# Patient Record
Sex: Female | Born: 1960 | Race: Black or African American | Hispanic: No | Smoking: Never smoker
Health system: Southern US, Community
[De-identification: ages and names within clinical notes are randomized; demographics above are authoritative.]

## PROBLEM LIST (undated history)

## (undated) DIAGNOSIS — E785 Hyperlipidemia, unspecified: Secondary | ICD-10-CM

## (undated) DIAGNOSIS — Z923 Personal history of irradiation: Secondary | ICD-10-CM

## (undated) DIAGNOSIS — K219 Gastro-esophageal reflux disease without esophagitis: Secondary | ICD-10-CM

## (undated) DIAGNOSIS — D0512 Intraductal carcinoma in situ of left breast: Secondary | ICD-10-CM

## (undated) DIAGNOSIS — D0510 Intraductal carcinoma in situ of unspecified breast: Secondary | ICD-10-CM

## (undated) DIAGNOSIS — E049 Nontoxic goiter, unspecified: Secondary | ICD-10-CM

## (undated) DIAGNOSIS — M1990 Unspecified osteoarthritis, unspecified site: Secondary | ICD-10-CM

## (undated) DIAGNOSIS — E042 Nontoxic multinodular goiter: Secondary | ICD-10-CM

## (undated) DIAGNOSIS — Z9889 Other specified postprocedural states: Secondary | ICD-10-CM

## (undated) DIAGNOSIS — R269 Unspecified abnormalities of gait and mobility: Secondary | ICD-10-CM

## (undated) DIAGNOSIS — G35 Multiple sclerosis: Secondary | ICD-10-CM

## (undated) DIAGNOSIS — E7800 Pure hypercholesterolemia, unspecified: Secondary | ICD-10-CM

## (undated) DIAGNOSIS — I10 Essential (primary) hypertension: Secondary | ICD-10-CM

## (undated) DIAGNOSIS — Z853 Personal history of malignant neoplasm of breast: Secondary | ICD-10-CM

## (undated) DIAGNOSIS — G35D Multiple sclerosis, unspecified: Secondary | ICD-10-CM

## (undated) DIAGNOSIS — D573 Sickle-cell trait: Secondary | ICD-10-CM

## (undated) DIAGNOSIS — Z86000 Personal history of in-situ neoplasm of breast: Secondary | ICD-10-CM

## (undated) DIAGNOSIS — E039 Hypothyroidism, unspecified: Secondary | ICD-10-CM

## (undated) DIAGNOSIS — C50919 Malignant neoplasm of unspecified site of unspecified female breast: Secondary | ICD-10-CM

## (undated) HISTORY — DX: Other specified postprocedural states: Z98.890

## (undated) HISTORY — DX: Multiple sclerosis, unspecified: G35.D

## (undated) HISTORY — DX: Essential (primary) hypertension: I10

## (undated) HISTORY — DX: Multiple sclerosis: G35

## (undated) HISTORY — DX: Unspecified abnormalities of gait and mobility: R26.9

## (undated) HISTORY — DX: Personal history of in-situ neoplasm of breast: Z86.000

## (undated) HISTORY — DX: Personal history of malignant neoplasm of breast: Z85.3

## (undated) HISTORY — DX: Hypothyroidism, unspecified: E03.9

## (undated) HISTORY — DX: Intraductal carcinoma in situ of unspecified breast: D05.10

## (undated) HISTORY — DX: Nontoxic multinodular goiter: E04.2

## (undated) HISTORY — DX: Hyperlipidemia, unspecified: E78.5

## (undated) HISTORY — DX: Intraductal carcinoma in situ of left breast: D05.12

## (undated) HISTORY — DX: Sickle-cell trait: D57.3

## (undated) HISTORY — DX: Pure hypercholesterolemia, unspecified: E78.00

## (undated) HISTORY — DX: Unspecified osteoarthritis, unspecified site: M19.90

## (undated) HISTORY — DX: Malignant neoplasm of unspecified site of unspecified female breast: C50.919

## (undated) HISTORY — DX: Gastro-esophageal reflux disease without esophagitis: K21.9

## (undated) HISTORY — DX: Hypercalcemia: E83.52

## (undated) HISTORY — DX: Nontoxic goiter, unspecified: E04.9

---

## 1983-12-31 HISTORY — PX: TUBAL LIGATION: SHX77

## 1998-03-21 ENCOUNTER — Ambulatory Visit (HOSPITAL_COMMUNITY): Admission: RE | Admit: 1998-03-21 | Discharge: 1998-03-21 | Payer: Self-pay | Admitting: *Deleted

## 1998-04-27 ENCOUNTER — Ambulatory Visit (HOSPITAL_COMMUNITY): Admission: RE | Admit: 1998-04-27 | Discharge: 1998-04-27 | Payer: Self-pay | Admitting: *Deleted

## 1998-05-18 ENCOUNTER — Encounter: Admission: RE | Admit: 1998-05-18 | Discharge: 1998-05-18 | Payer: Self-pay | Admitting: Obstetrics

## 1998-05-18 ENCOUNTER — Inpatient Hospital Stay (HOSPITAL_COMMUNITY): Admission: RE | Admit: 1998-05-18 | Discharge: 1998-05-18 | Payer: Self-pay | Admitting: Obstetrics

## 1998-06-08 ENCOUNTER — Encounter: Admission: RE | Admit: 1998-06-08 | Discharge: 1998-06-08 | Payer: Self-pay | Admitting: Internal Medicine

## 1998-10-26 ENCOUNTER — Encounter: Admission: RE | Admit: 1998-10-26 | Discharge: 1998-10-26 | Payer: Self-pay | Admitting: Internal Medicine

## 1999-03-01 ENCOUNTER — Encounter: Admission: RE | Admit: 1999-03-01 | Discharge: 1999-03-01 | Payer: Self-pay | Admitting: Internal Medicine

## 1999-03-23 ENCOUNTER — Encounter: Admission: RE | Admit: 1999-03-23 | Discharge: 1999-03-23 | Payer: Self-pay | Admitting: Internal Medicine

## 1999-04-13 ENCOUNTER — Encounter: Admission: RE | Admit: 1999-04-13 | Discharge: 1999-04-13 | Payer: Self-pay | Admitting: Internal Medicine

## 1999-05-01 ENCOUNTER — Encounter: Admission: RE | Admit: 1999-05-01 | Discharge: 1999-05-01 | Payer: Self-pay | Admitting: Internal Medicine

## 1999-08-04 ENCOUNTER — Ambulatory Visit (HOSPITAL_COMMUNITY): Admission: RE | Admit: 1999-08-04 | Discharge: 1999-08-04 | Payer: Self-pay | Admitting: Pulmonary Disease

## 1999-08-04 ENCOUNTER — Encounter: Payer: Self-pay | Admitting: Pulmonary Disease

## 1999-08-24 ENCOUNTER — Ambulatory Visit (HOSPITAL_COMMUNITY): Admission: RE | Admit: 1999-08-24 | Discharge: 1999-08-24 | Payer: Self-pay | Admitting: Pulmonary Disease

## 1999-08-24 ENCOUNTER — Encounter: Payer: Self-pay | Admitting: Pulmonary Disease

## 1999-10-05 ENCOUNTER — Encounter: Payer: Self-pay | Admitting: Pulmonary Disease

## 1999-10-05 ENCOUNTER — Ambulatory Visit (HOSPITAL_COMMUNITY): Admission: RE | Admit: 1999-10-05 | Discharge: 1999-10-05 | Payer: Self-pay | Admitting: Pulmonary Disease

## 1999-10-19 ENCOUNTER — Other Ambulatory Visit: Admission: RE | Admit: 1999-10-19 | Discharge: 1999-11-13 | Payer: Self-pay

## 1999-11-10 ENCOUNTER — Encounter: Payer: Self-pay | Admitting: Neurosurgery

## 1999-11-10 ENCOUNTER — Ambulatory Visit (HOSPITAL_COMMUNITY): Admission: RE | Admit: 1999-11-10 | Discharge: 1999-11-10 | Payer: Self-pay | Admitting: Neurosurgery

## 1999-11-21 ENCOUNTER — Emergency Department (HOSPITAL_COMMUNITY): Admission: EM | Admit: 1999-11-21 | Discharge: 1999-11-21 | Payer: Self-pay | Admitting: Podiatry

## 1999-11-21 ENCOUNTER — Encounter: Payer: Self-pay | Admitting: Emergency Medicine

## 1999-12-31 DIAGNOSIS — G35 Multiple sclerosis: Secondary | ICD-10-CM | POA: Insufficient documentation

## 2000-01-07 ENCOUNTER — Encounter: Payer: Self-pay | Admitting: Neurology

## 2000-01-07 ENCOUNTER — Ambulatory Visit (HOSPITAL_COMMUNITY): Admission: RE | Admit: 2000-01-07 | Discharge: 2000-01-07 | Payer: Self-pay | Admitting: Neurology

## 2000-02-12 ENCOUNTER — Encounter: Admission: RE | Admit: 2000-02-12 | Discharge: 2000-03-27 | Payer: Self-pay | Admitting: Neurology

## 2000-08-11 ENCOUNTER — Encounter: Admission: RE | Admit: 2000-08-11 | Discharge: 2000-11-09 | Payer: Self-pay | Admitting: Psychiatry

## 2001-08-03 ENCOUNTER — Encounter: Admission: RE | Admit: 2001-08-03 | Discharge: 2001-11-01 | Payer: Self-pay | Admitting: Psychiatry

## 2002-01-17 ENCOUNTER — Emergency Department (HOSPITAL_COMMUNITY): Admission: EM | Admit: 2002-01-17 | Discharge: 2002-01-17 | Payer: Self-pay

## 2002-06-01 ENCOUNTER — Encounter: Payer: Self-pay | Admitting: Pulmonary Disease

## 2002-06-01 ENCOUNTER — Ambulatory Visit (HOSPITAL_COMMUNITY): Admission: RE | Admit: 2002-06-01 | Discharge: 2002-06-01 | Payer: Self-pay | Admitting: Pulmonary Disease

## 2004-09-18 ENCOUNTER — Ambulatory Visit (HOSPITAL_COMMUNITY): Admission: RE | Admit: 2004-09-18 | Discharge: 2004-09-18 | Payer: Self-pay | Admitting: Pulmonary Disease

## 2005-12-30 DIAGNOSIS — D0510 Intraductal carcinoma in situ of unspecified breast: Secondary | ICD-10-CM

## 2005-12-30 DIAGNOSIS — Z853 Personal history of malignant neoplasm of breast: Secondary | ICD-10-CM

## 2005-12-30 DIAGNOSIS — C50919 Malignant neoplasm of unspecified site of unspecified female breast: Secondary | ICD-10-CM

## 2005-12-30 HISTORY — DX: Intraductal carcinoma in situ of unspecified breast: D05.10

## 2005-12-30 HISTORY — DX: Personal history of malignant neoplasm of breast: Z85.3

## 2005-12-30 HISTORY — DX: Malignant neoplasm of unspecified site of unspecified female breast: C50.919

## 2005-12-30 HISTORY — PX: BREAST LUMPECTOMY: SHX2

## 2006-01-23 ENCOUNTER — Ambulatory Visit (HOSPITAL_COMMUNITY): Admission: RE | Admit: 2006-01-23 | Discharge: 2006-01-23 | Payer: Self-pay | Admitting: Pulmonary Disease

## 2006-02-17 ENCOUNTER — Encounter: Admission: RE | Admit: 2006-02-17 | Discharge: 2006-02-17 | Payer: Self-pay | Admitting: Pulmonary Disease

## 2006-03-04 ENCOUNTER — Encounter: Admission: RE | Admit: 2006-03-04 | Discharge: 2006-03-04 | Payer: Self-pay | Admitting: Pulmonary Disease

## 2006-03-12 ENCOUNTER — Encounter: Admission: RE | Admit: 2006-03-12 | Discharge: 2006-03-12 | Payer: Self-pay | Admitting: Pulmonary Disease

## 2006-03-13 ENCOUNTER — Encounter: Admission: RE | Admit: 2006-03-13 | Discharge: 2006-03-13 | Payer: Self-pay | Admitting: Pulmonary Disease

## 2006-03-13 ENCOUNTER — Encounter (INDEPENDENT_AMBULATORY_CARE_PROVIDER_SITE_OTHER): Payer: Self-pay | Admitting: Diagnostic Radiology

## 2006-03-13 ENCOUNTER — Encounter (INDEPENDENT_AMBULATORY_CARE_PROVIDER_SITE_OTHER): Payer: Self-pay | Admitting: Specialist

## 2006-04-09 ENCOUNTER — Encounter: Admission: RE | Admit: 2006-04-09 | Discharge: 2006-04-09 | Payer: Self-pay | Admitting: Surgery

## 2006-04-10 ENCOUNTER — Encounter: Admission: RE | Admit: 2006-04-10 | Discharge: 2006-04-10 | Payer: Self-pay | Admitting: Surgery

## 2006-04-10 ENCOUNTER — Encounter (INDEPENDENT_AMBULATORY_CARE_PROVIDER_SITE_OTHER): Payer: Self-pay | Admitting: *Deleted

## 2006-04-10 ENCOUNTER — Ambulatory Visit (HOSPITAL_BASED_OUTPATIENT_CLINIC_OR_DEPARTMENT_OTHER): Admission: RE | Admit: 2006-04-10 | Discharge: 2006-04-10 | Payer: Self-pay | Admitting: Surgery

## 2006-04-28 ENCOUNTER — Ambulatory Visit: Payer: Self-pay | Admitting: Oncology

## 2006-05-13 ENCOUNTER — Ambulatory Visit: Admission: RE | Admit: 2006-05-13 | Discharge: 2006-08-11 | Payer: Self-pay | Admitting: Radiation Oncology

## 2006-06-09 ENCOUNTER — Encounter (INDEPENDENT_AMBULATORY_CARE_PROVIDER_SITE_OTHER): Payer: Self-pay | Admitting: Specialist

## 2006-06-09 ENCOUNTER — Ambulatory Visit (HOSPITAL_BASED_OUTPATIENT_CLINIC_OR_DEPARTMENT_OTHER): Admission: RE | Admit: 2006-06-09 | Discharge: 2006-06-09 | Payer: Self-pay | Admitting: Surgery

## 2006-08-12 ENCOUNTER — Ambulatory Visit: Admission: RE | Admit: 2006-08-12 | Discharge: 2006-09-07 | Payer: Self-pay | Admitting: Radiation Oncology

## 2006-11-06 ENCOUNTER — Ambulatory Visit: Payer: Self-pay | Admitting: Oncology

## 2007-02-10 ENCOUNTER — Encounter: Admission: RE | Admit: 2007-02-10 | Discharge: 2007-02-10 | Payer: Self-pay | Admitting: Oncology

## 2007-10-05 ENCOUNTER — Encounter: Admission: RE | Admit: 2007-10-05 | Discharge: 2007-10-05 | Payer: Self-pay | Admitting: Pulmonary Disease

## 2007-10-06 ENCOUNTER — Encounter: Admission: RE | Admit: 2007-10-06 | Discharge: 2007-10-06 | Payer: Self-pay | Admitting: Pulmonary Disease

## 2007-11-05 ENCOUNTER — Ambulatory Visit: Payer: Self-pay | Admitting: Oncology

## 2007-12-17 ENCOUNTER — Ambulatory Visit: Payer: Self-pay | Admitting: Oncology

## 2007-12-21 LAB — CBC WITH DIFFERENTIAL/PLATELET
Basophils Absolute: 0 10*3/uL (ref 0.0–0.1)
HCT: 38.3 % (ref 34.8–46.6)
HGB: 13.1 g/dL (ref 11.6–15.9)
LYMPH%: 36.1 % (ref 14.0–48.0)
MCH: 26.3 pg (ref 26.0–34.0)
MCHC: 34.3 g/dL (ref 32.0–36.0)
MCV: 76.6 fL — ABNORMAL LOW (ref 81.0–101.0)
NEUT#: 1.9 10*3/uL (ref 1.5–6.5)
Platelets: 272 10*3/uL (ref 145–400)

## 2008-02-18 ENCOUNTER — Encounter: Admission: RE | Admit: 2008-02-18 | Discharge: 2008-02-18 | Payer: Self-pay | Admitting: Pulmonary Disease

## 2008-08-31 ENCOUNTER — Ambulatory Visit: Payer: Self-pay | Admitting: Oncology

## 2009-03-07 ENCOUNTER — Encounter: Admission: RE | Admit: 2009-03-07 | Discharge: 2009-03-07 | Payer: Self-pay | Admitting: Pulmonary Disease

## 2010-03-08 ENCOUNTER — Encounter: Admission: RE | Admit: 2010-03-08 | Discharge: 2010-03-08 | Payer: Self-pay | Admitting: Pulmonary Disease

## 2011-01-20 ENCOUNTER — Encounter: Payer: Self-pay | Admitting: Pulmonary Disease

## 2011-03-15 ENCOUNTER — Other Ambulatory Visit (HOSPITAL_COMMUNITY): Payer: Self-pay | Admitting: Pulmonary Disease

## 2011-03-15 DIAGNOSIS — Z1231 Encounter for screening mammogram for malignant neoplasm of breast: Secondary | ICD-10-CM

## 2011-03-19 ENCOUNTER — Other Ambulatory Visit: Payer: Self-pay | Admitting: Pulmonary Disease

## 2011-03-19 DIAGNOSIS — Z1231 Encounter for screening mammogram for malignant neoplasm of breast: Secondary | ICD-10-CM

## 2011-03-19 DIAGNOSIS — Z9889 Other specified postprocedural states: Secondary | ICD-10-CM

## 2011-03-28 ENCOUNTER — Ambulatory Visit
Admission: RE | Admit: 2011-03-28 | Discharge: 2011-03-28 | Disposition: A | Discharge status: Home / Self Care | Payer: Managed Care, Other (non HMO) | Source: Ambulatory Visit | Attending: Pulmonary Disease | Admitting: Pulmonary Disease

## 2011-03-28 DIAGNOSIS — Z9889 Other specified postprocedural states: Secondary | ICD-10-CM

## 2011-05-17 NOTE — Op Note (Signed)
NAMESUMER, MOOREHOUSE                ACCOUNT NO.:  [id number]   MEDICAL RECORD NO.:  [id number]          PATIENT TYPE:  AMB   LOCATION:  DSC                          FACILITY:  [facility]   PHYSICIAN:  Currie Paris, M.D.DATE OF BIRTH:  December 08, 1961   DATE OF PROCEDURE:  06/09/2006  DATE OF DISCHARGE:                                 OPERATIVE REPORT   CCS [phone]   PREOPERATIVE DIAGNOSIS:  Left breast cancer, upper inner quadrant with close  margin on the lumpectomy.   POSTOPERATIVE DIAGNOSIS:  Left breast cancer, upper inner quadrant with  close margin on the lumpectomy.   OPERATION:  Re-excision left lumpectomy site.   SURGEON:  Dr. Jamey Ripa.   ANESTHESIA:  MAC.   CLINICAL HISTORY:  Ms. Georgi has undergone a left lumpectomy.  Her superior  margin was quite close and after consultation with radiation therapy, we  elected to go back for re-excision prior to instigation of radiation  therapy.   DESCRIPTION OF PROCEDURE:  The patient was seen in the holding area and had  no further questions.  We identified the left breast in the operative site  and marked it.   The patient was taken to operating room and after satisfactory IV sedation  was obtained, the left breast was prepped and draped as a sterile field.  The time-out occurred.   I infiltrated a combination of 1% Xylocaine, 0.25% plain Marcaine around the  prior scar and the skin above and below it into the breast tissue.  The  incision was made using the very superior edge of the prior scar.  I raised  a little bit of a flap going inferiorly and then down to the chest wall so  that I would get the majority of the prior inferior part of the biopsy site  and then raised skin flap superiorly and took out a good margin going well  up into what appeared to be normal breast tissue for a whole new superior  margin.  I extended laterally to subareolar area and medially to beyond the  prior excision as well.  Went down to and  including what appeared to fascia  although I think most of that had been gone before.  We did have muscle  exposed.   The specimen was taken off and oriented for pathology.   I injected additional local as we worked and at this point injected some  more into the muscle and all around so that we had good postop pain relief  as well as a good relief for the procedure.  Once I made sure everything was  dry, I put a couple of clips in to mark margins of excision.  A little bit  of the breast tissue itself could be closed laterally  under this areolar area but most of it was going to have to be left as a  seroma cavity.  I closed subcu with some 3-0 Vicryl and the skin with a 4-0  Monocryl subcuticular and some Dermabond.   The patient tolerated procedure well.  There were no operative  complications.  All  counts were correct.      Currie Paris, M.D.  Electronically Signed     CJS/MEDQ  D:  06/09/2006  T:  06/09/2006  Job:  [id number]

## 2011-05-17 NOTE — Op Note (Signed)
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   OPERATION:  Needle guided excision left breast cancer.   SURGEON:  Currie Paris, M.D.   ANESTHESIA:  General.   CLINICAL HISTORY:  This is a 50 year old lady who had an area of abnormality  in the medial left breast and a biopsy showed DCIS.  After discussion with  the patient, she elected to undergo needle guided excision.   DESCRIPTION OF PROCEDURE:  There was a guidewire placed by radiology.  I  reviewed those films.  The patient and I both identified the left breast as  the operative side and marked it.  The patient was taken to the operating  room. After satisfactory general anesthesia had been obtained, the breast  was prepped and draped.  The time-out occurred.   The guidewire entered medially just above the 9 o'clock line and traversed  horizontally towards the top edge of the nipple-areolar area.  I made an  elliptical incision starting at the guidewire and working laterally towards  the nipple.  Once the incision was made, I raised skin flaps and divided  breast tissue superiorly down to the chest wall trying to stay at least 1 cm  above the tract in the guidewire.  I did a similar elevation and division of  breast tissue inferiorly, again, staying at least 1 cm beyond the guidewire.  I then medially at the guidewire entry site, divided the breast tissue  straight down to the chest wall since the calcifications lay more laterally  than that.   Then, having freed it up on three sides, I freed it up  underneath taking the muscle and fascia and elevating the entire specimen up  into the wound.  Final lateral attachments which were really subareolar were  then divided with cautery completing the excision.  The guidewire was seen  entering what appeared to be the pectoralis major muscle about the middle  portion of the excision so I felt I was well lateral to the area in question  and at least visually I had about a 3 cm wide cavity.   I made sure everything was dry using cautery.  I infiltrated 0.25% plain  Marcaine to help with postop analgesia.  I closed the subcu leaving a cavity  to fill up with a seroma.  This was done with 3-0 Vicryl.  The skin was  closed with  4-0 Monocryl subcuticular plus Dermabond.  Radiology reported we had a good  excision with the area in question well within the specimen.  This concluded  the procedure.  The patient  tolerated procedure well.  There were no  operative complications.  All counts were correct.      Currie Paris, M.D.  Electronically Signed     CJS/MEDQ  D:  04/10/2006  T:  04/10/2006  Job:  [id number]   cc:   Mina Marble, M.D.  Fax: [phone]   Trudie Buckler, M.D.

## 2012-05-07 ENCOUNTER — Other Ambulatory Visit (HOSPITAL_COMMUNITY): Payer: Self-pay | Admitting: Pulmonary Disease

## 2012-05-07 DIAGNOSIS — Z1231 Encounter for screening mammogram for malignant neoplasm of breast: Secondary | ICD-10-CM

## 2012-05-12 ENCOUNTER — Ambulatory Visit (HOSPITAL_COMMUNITY): Payer: Self-pay

## 2012-06-12 ENCOUNTER — Ambulatory Visit (HOSPITAL_COMMUNITY)
Admission: RE | Admit: 2012-06-12 | Discharge: 2012-06-12 | Disposition: A | Discharge status: Home / Self Care | Payer: Self-pay | Source: Ambulatory Visit | Attending: Pulmonary Disease | Admitting: Pulmonary Disease

## 2012-06-12 DIAGNOSIS — Z1231 Encounter for screening mammogram for malignant neoplasm of breast: Secondary | ICD-10-CM

## 2012-12-30 DIAGNOSIS — D126 Benign neoplasm of colon, unspecified: Secondary | ICD-10-CM | POA: Insufficient documentation

## 2012-12-30 HISTORY — PX: COLONOSCOPY: SHX174

## 2013-03-11 ENCOUNTER — Encounter: Payer: Self-pay | Admitting: Gastroenterology

## 2013-03-15 ENCOUNTER — Other Ambulatory Visit: Payer: Self-pay | Admitting: Neurology

## 2013-03-15 DIAGNOSIS — G35 Multiple sclerosis: Secondary | ICD-10-CM

## 2013-03-17 ENCOUNTER — Other Ambulatory Visit: Payer: Self-pay

## 2013-03-22 ENCOUNTER — Telehealth: Payer: Self-pay

## 2013-03-22 DIAGNOSIS — G35 Multiple sclerosis: Secondary | ICD-10-CM

## 2013-03-22 MED ORDER — INTERFERON BETA-1B 0.3 MG ~~LOC~~ KIT: 0.2500 mg | PACK | SUBCUTANEOUS | Status: DC

## 2013-03-22 NOTE — Telephone Encounter (Signed)
Patient would like her rx sent to [facility].  This is not a pharmacy listed in the Epic System.  It would not allow me to add the pharmacy.  I will print the rx, and once it has been signed, will manually fax it to [facility].  JCB

## 2013-03-24 ENCOUNTER — Ambulatory Visit (INDEPENDENT_AMBULATORY_CARE_PROVIDER_SITE_OTHER): Payer: BC Managed Care – PPO

## 2013-03-24 DIAGNOSIS — G35 Multiple sclerosis: Secondary | ICD-10-CM

## 2013-03-24 DIAGNOSIS — Z0289 Encounter for other administrative examinations: Secondary | ICD-10-CM

## 2013-03-25 MED ORDER — GADOPENTETATE DIMEGLUMINE 469.01 MG/ML IV SOLN: 17.0000 mL | Freq: Once | INTRAVENOUS | Status: AC | PRN

## 2013-03-26 NOTE — Procedures (Addendum)
[facility]  NEUROIMAGING REPORT   STUDY DATE: 03/24/13 PATIENT NAME: Mariah Jackson DOB: 1961/12/05 MRN: [id number]  ORDERING CLINICIAN: Levert Feinstein, MD CLINICAL HISTORY: 52 year old female with multiple sclerosis.  EXAM: MRI brain (with and without)  TECHNIQUE: MRI of the brain with and without contrast was obtained utilizing 5 mm axial slices with T1, T2, T2 flair, T2 star gradient echo and diffusion weighted views.  T1 sagittal, T2 coronal and postcontrast views in the axial and coronal plane were obtained. CONTRAST: 17ml magnevist IMAGING SITE: [facility] [address]   FINDINGS:  No abnormal lesions are seen on diffusion-weighted views to suggest acute ischemia. The cortical sulci, fissures and cisterns are normal in size and appearance. Lateral, third and fourth ventricle are normal in size and appearance. No extra-axial fluid collections are seen. No evidence of mass effect or midline shift.  Multiple periventricular and subcortical chronic demyelinating disease.  No abnormal lesions are seen on post contrast views.  On sagittal views the posterior fossa, pituitary gland and corpus callosum are unremarkable. No evidence of intracranial hemorrhage on gradient-echo views. The orbits and their contents, paranasal sinuses and calvarium are unremarkable.  Intracranial flow voids are present.  IMPRESSION:  Abnormal MRI brain (with and without) demonstrating: 1. Multiple periventricular and subcortical chronic demyelinating disease. 2. No acute plaques are seen.   INTERPRETING PHYSICIAN:  Suanne Marker, MD Certified in Neurology, Neurophysiology and Neuroimaging  [facility] [address], [address] [place], [place] [address] [phone]

## 2013-03-26 NOTE — Procedures (Signed)
[facility]  NEUROIMAGING REPORT   STUDY DATE: 03/24/13 PATIENT NAME: Mariah Jackson DOB: 01-31-1961 MRN: [id number]  ORDERING CLINICIAN: Levert Feinstein, MD  CLINICAL HISTORY: 52 year old female with multiple sclerosis.  EXAM: MRI cervical spine (with and without)  TECHNIQUE: MRI of the cervical spine was obtained utilizing 3 mm sagittal slices from the posterior fossa down to the T3-4 level with T1, T2 and inversion recovery views. In addition 4 mm axial slices from C2-3 down to T1-2 level were included with T2 and gradient echo views. CONTRAST: 17ml magnevist IMAGING SITE: [facility] [address]   FINDINGS:  On sagittal views the vertebral bodies have normal height and alignment. Disc bulging at C5-6. Spondylosis at C5-6 and C6-7.  The spinal cord is normal in size and appearance. The posterior fossa, pituitary gland and paraspinal soft tissues are unremarkable.    On axial views there is no spinal stenosis or foraminal narrowing. At C5-6 there is focal disc protrusion slightly deforms anterior spinal cord, with spinal stenosis or foraminal stenosis. Limited views of the soft tissues of the head and neck are unremarkable. No abnormal enhancing lesions.  IMPRESSION:  Mildly abnormal MRI cervical spine (with and without) demonstrating: 1. Disc bulging at C5-6. Spondylosis at C5-6 and C6-7.  2. At C5-6 there is focal disc protrusion slightly deforms anterior spinal cord, with spinal stenosis or foraminal stenosis.  3. No intrinsic or enhancing spinal cord lesions.   INTERPRETING PHYSICIAN:  Suanne Marker, MD Certified in Neurology, Neurophysiology and Neuroimaging  [facility] [address], [address] [place], [place] [address] [phone]

## 2013-03-29 ENCOUNTER — Telehealth: Payer: Self-pay | Admitting: Neurology

## 2013-03-29 NOTE — Telephone Encounter (Signed)
I have called her about MRI brain and MRI cervical report. I left message for her to call back for discussion

## 2013-03-29 NOTE — Telephone Encounter (Signed)
Patient calling back sorry I  missed Dr.Yan phone call about MRI.

## 2013-03-29 NOTE — Telephone Encounter (Signed)
I have called her, MRI of brain showed multiple plaques, consistent with multiple sclerosis, no enhancing lesions. MRI of the cervical showed no lesions,  She is overall happy with his current Betaseron treatment, will keep current treatment, followup as scheduled

## 2013-04-23 ENCOUNTER — Ambulatory Visit (AMBULATORY_SURGERY_CENTER): Payer: BC Managed Care – PPO | Admitting: *Deleted

## 2013-04-23 ENCOUNTER — Encounter: Payer: Self-pay | Admitting: Gastroenterology

## 2013-04-23 VITALS — Ht 63.5 in | Wt 183.2 lb

## 2013-04-23 DIAGNOSIS — Z1211 Encounter for screening for malignant neoplasm of colon: Secondary | ICD-10-CM

## 2013-04-23 MED ORDER — NA SULFATE-K SULFATE-MG SULF 17.5-3.13-1.6 GM/177ML PO SOLN: ORAL | Status: DC

## 2013-05-07 ENCOUNTER — Ambulatory Visit (AMBULATORY_SURGERY_CENTER): Payer: BC Managed Care – PPO | Admitting: Gastroenterology

## 2013-05-07 ENCOUNTER — Encounter: Payer: Self-pay | Admitting: Gastroenterology

## 2013-05-07 VITALS — BP 128/82 | HR 85 | Temp 97.9°F | Resp 24 | Ht 63.0 in | Wt 183.0 lb

## 2013-05-07 DIAGNOSIS — D126 Benign neoplasm of colon, unspecified: Secondary | ICD-10-CM

## 2013-05-07 DIAGNOSIS — Z1211 Encounter for screening for malignant neoplasm of colon: Secondary | ICD-10-CM

## 2013-05-07 MED ORDER — SODIUM CHLORIDE 0.9 % IV SOLN: 500.0000 mL | INTRAVENOUS | Status: DC

## 2013-05-07 NOTE — Progress Notes (Signed)
Charting documented by Wendy Myers  Patient did not experience any of the following events: a burn prior to discharge; a fall within the facility; wrong site/side/patient/procedure/implant event; or a hospital transfer or hospital admission upon discharge from the facility. (G8907)  Patient did not have preoperative order for IV antibiotic SSI prophylaxis. (G8918) 

## 2013-05-07 NOTE — Op Note (Signed)
[facility] [address] [place], [address]   COLONOSCOPY PROCEDURE REPORT  PATIENT: Mariah Jackson, Mariah Jackson  MR#: [id number] BIRTHDATE: 05-31-1961 , 52  yrs. old GENDER: Female ENDOSCOPIST: Louis Meckel, MD REFERRED XB:JYNWG Blount, M.D. PROCEDURE DATE:  05/07/2013 PROCEDURE:   Colonoscopy with snare polypectomy ASA CLASS:   Class II INDICATIONS:average risk screening. MEDICATIONS: MAC sedation, administered by CRNA and Propofol (Diprivan) 270 mg IV  DESCRIPTION OF PROCEDURE:   After the risks benefits and alternatives of the procedure were thoroughly explained, informed consent was obtained.  A digital rectal exam revealed no abnormalities of the rectum.   The LB [id number]  endoscope was introduced through the anus and advanced to the cecum, which was identified by the ileocecal valve. No adverse events experienced.   The quality of the prep was excellent using Suprep The instrument was then slowly withdrawn as the colon was fully examined.      COLON FINDINGS: A sessile polyp measuring 3 mm in size was found in the descending colon.  A polypectomy was performed with a cold snare.  The resection was complete and the polyp tissue was completely retrieved.   The colon mucosa was otherwise normal. Retroflexed views revealed no abnormalities. The time to cecum=6 minutes 04 seconds.  Withdrawal time=8 minutes 39 seconds.  The scope was withdrawn and the procedure completed. COMPLICATIONS: There were no complications.  ENDOSCOPIC IMPRESSION: 1.   Sessile polyp measuring 3 mm in size was found in the descending colon; polypectomy was performed with a cold snare 2.   The colon mucosa was otherwise normal  RECOMMENDATIONS: If the polyp(s) removed today are proven to be adenomatous (pre-cancerous) polyps, you will need a repeat colonoscopy in 5 years.  Otherwise you should continue to follow colorectal cancer screening guidelines for "routine risk"  patients with colonoscopy in 10 years.  You will receive a letter within 1-2 weeks with the results of your biopsy as well as final recommendations.  Please call my office if you have not received a letter after 3 weeks.   eSigned:  Louis Meckel, MD 05/07/2013 10:14 AM   cc:

## 2013-05-07 NOTE — Progress Notes (Signed)
Report to pacu rn, vss, bbs=clear 

## 2013-05-07 NOTE — Patient Instructions (Addendum)

## 2013-05-07 NOTE — Progress Notes (Signed)
Called to room to assist during endoscopic procedure.  Patient ID and intended procedure confirmed with present staff. Received instructions for my participation in the procedure from the performing physician. ewm 

## 2013-05-10 ENCOUNTER — Telehealth: Payer: Self-pay

## 2013-05-10 NOTE — Telephone Encounter (Signed)
  Follow up Call-  Call back number 05/07/2013  Post procedure Call Back phone  # [phone] or [phone]  Permission to leave phone message Yes     Patient questions:  Do you have a fever, pain , or abdominal swelling? no Pain Score  0 *  Have you tolerated food without any problems? yes  Have you been able to return to your normal activities? yes  Do you have any questions about your discharge instructions: Diet   no Medications  no Follow up visit  no  Do you have questions or concerns about your Care? no  Actions: * If pain score is 4 or above: No action needed, pain <4.   No problems per the pt. Maw

## 2013-05-12 ENCOUNTER — Telehealth: Payer: Self-pay

## 2013-05-12 DIAGNOSIS — G35 Multiple sclerosis: Secondary | ICD-10-CM

## 2013-05-12 MED ORDER — INTERFERON BETA-1B 0.3 MG ~~LOC~~ KIT: 0.2500 mg | PACK | SUBCUTANEOUS | Status: DC

## 2013-05-12 NOTE — Telephone Encounter (Signed)
Patient called to get refill on Betaseron sent to [facility].  This was already sent on 03/22/2013, however I have resent it.

## 2013-05-13 ENCOUNTER — Encounter: Payer: Self-pay | Admitting: Gastroenterology

## 2013-06-25 ENCOUNTER — Other Ambulatory Visit (HOSPITAL_COMMUNITY): Payer: Self-pay | Admitting: Family Medicine

## 2013-06-25 DIAGNOSIS — Z1231 Encounter for screening mammogram for malignant neoplasm of breast: Secondary | ICD-10-CM

## 2013-07-07 ENCOUNTER — Ambulatory Visit (HOSPITAL_COMMUNITY)
Admission: RE | Admit: 2013-07-07 | Discharge: 2013-07-07 | Disposition: A | Discharge status: Home / Self Care | Payer: BC Managed Care – PPO | Source: Ambulatory Visit | Attending: Family Medicine | Admitting: Family Medicine

## 2013-07-07 DIAGNOSIS — Z1231 Encounter for screening mammogram for malignant neoplasm of breast: Secondary | ICD-10-CM

## 2013-09-09 ENCOUNTER — Telehealth: Payer: Self-pay | Admitting: *Deleted

## 2013-09-09 NOTE — Telephone Encounter (Signed)
Left patient a reminder message about appointment on 09-10-13 

## 2013-09-10 ENCOUNTER — Encounter: Payer: Self-pay | Admitting: Nurse Practitioner

## 2013-09-10 ENCOUNTER — Ambulatory Visit (INDEPENDENT_AMBULATORY_CARE_PROVIDER_SITE_OTHER): Payer: BC Managed Care – PPO | Admitting: Nurse Practitioner

## 2013-09-10 VITALS — BP 146/90 | HR 95 | Ht 64.0 in | Wt 184.0 lb

## 2013-09-10 DIAGNOSIS — G35 Multiple sclerosis: Secondary | ICD-10-CM

## 2013-09-10 DIAGNOSIS — R269 Unspecified abnormalities of gait and mobility: Secondary | ICD-10-CM

## 2013-09-10 DIAGNOSIS — Z79899 Other long term (current) drug therapy: Secondary | ICD-10-CM

## 2013-09-10 HISTORY — DX: Multiple sclerosis: G35

## 2013-09-10 HISTORY — DX: Unspecified abnormalities of gait and mobility: R26.9

## 2013-09-10 LAB — CBC WITH DIFFERENTIAL/PLATELET
Basos: 0 % (ref 0–3)
Eos: 3 % (ref 0–5)
HCT: 41.3 % (ref 34.0–46.6)
Hemoglobin: 14.3 g/dL (ref 11.1–15.9)
Lymphocytes Absolute: 1.4 10*3/uL (ref 0.7–3.1)
Lymphs: 33 % (ref 14–46)
Monocytes: 8 % (ref 4–12)
Neutrophils Absolute: 2.4 10*3/uL (ref 1.4–7.0)
RBC: 5.43 x10E6/uL — ABNORMAL HIGH (ref 3.77–5.28)

## 2013-09-10 LAB — COMPREHENSIVE METABOLIC PANEL
AST: 21 IU/L (ref 0–40)
Albumin/Globulin Ratio: 1.7 (ref 1.1–2.5)
Alkaline Phosphatase: 158 IU/L — ABNORMAL HIGH (ref 39–117)
BUN/Creatinine Ratio: 16 (ref 9–23)
Calcium: 11.1 mg/dL — ABNORMAL HIGH (ref 8.7–10.2)
Creatinine, Ser: 0.81 mg/dL (ref 0.57–1.00)
GFR calc Af Amer: 95 mL/min/{1.73_m2} (ref >59)
GFR calc non Af Amer: 83 mL/min/{1.73_m2} (ref >59)
Globulin, Total: 2.7 g/dL (ref 1.5–4.5)
Potassium: 3.6 mmol/L (ref 3.5–5.2)
Sodium: 142 mmol/L (ref 134–144)
Total Bilirubin: 0.6 mg/dL (ref 0.0–1.2)

## 2013-09-10 NOTE — Patient Instructions (Addendum)
Multiple injection site problems Will switch to tecfidera  Will check labs today Followup in 6 months

## 2013-09-10 NOTE — Progress Notes (Signed)
[facility]  PATIENT: Mariah Jackson DOB: [birth year]   REASON FOR VISIT: Follow up for MS   HISTORY OF PRESENT ILLNESS: Mariah Jackson, 52 year-old white female returns for followup. She  was diagnosed with multiple sclerosis in 2001, by Dr. Tinnie Gens at [facility]. The disease has primarily affected her balance and gait. She could only clarify one exacerbations, presenting as worsening balance difficulty, and says she has never been treated with intravenous steroids.  She was placed on Betaseron shortly after diagnosis, and has been on that drug ever since. She has not been on any other immunotherapy.  Her gait difficulty had little changes over past 11 years. She ambulates with a cane, but is able to get around the house pretty well without it.  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 Most recent MRI was in January 2011,shows multiple periventricular, periatrial and subcortical white matter hyperintensities suggestive of demyelinating disease.No enhancing lesions or significant atrophy is seen. compared to MRI films from Aug 2000 there appear to be more T2 lesions in right frontal subcortical region. She is self pay now, does not want extensive study, but she will benefit repeat MRI brain and cervical cord, if her application goes through  09/10/13: Patient returns for followup after last visit 03/11/13. She is currently on Betaseron, denies any exacerbations of symptoms since last seen. She claims she is tired of taking the shots and does have multiple areas of lipoatrophy, especially on the abdominal area.  She is ambulating with a cane. She is wishing to switch to an oral  preparation   REVIEW OF SYSTEMS: Full 14 system review of systems performed and notable only for:  Constitutional: Fatigue  Cardiovascular: N/A  Ear/Nose/Throat: N/A  Skin: N/A  Eyes: N/A  Respiratory: N/A  Gastroitestinal: Urinary frequency Hematology/Lymphatic: N/A  Endocrine:  N/A Musculoskeletal: Achy muscles  Allergy/Immunology: N/A  Neurological: N/A Psychiatric: Not enough sleep, decreased energy  ALLERGIES: Allergies  Allergen Reactions  . Vicodin [Hydrocodone-Acetaminophen] Nausea Only    HOME MEDICATIONS: Outpatient Prescriptions Prior to Visit  Medication Sig Dispense Refill  . amLODipine (NORVASC) 5 MG tablet Take 5 mg by mouth daily.      Marland Kitchen dalfampridine (AMPYRA) 10 MG TB12 Take 10 mg by mouth every 12 (twelve) hours.      . Interferon Beta-1b (BETASERON) 0.3 MG KIT injection Inject 0.25 mg into the skin every other day.  3 kit  4  . levothyroxine (SYNTHROID, LEVOTHROID) 100 MCG tablet Take 100 mcg by mouth daily before breakfast.      . Multiple Vitamin (MULTIVITAMIN) tablet Take 1 tablet by mouth daily.      . rosuvastatin (CRESTOR) 10 MG tablet Take 10 mg by mouth daily.      . quinapril-hydrochlorothiazide (ACCURETIC) 10-12.5 MG per tablet Take 1 tablet by mouth daily.       No facility-administered medications prior to visit.    PAST MEDICAL HISTORY: Past Medical History  Diagnosis Date  . Multiple sclerosis   . Hypothyroidism   . Hypertension   . Hyperlipidemia   . Breast cancer 2007    left    PAST SURGICAL HISTORY: Past Surgical History  Procedure Laterality Date  . Tubal ligation  1985  . Breast lumpectomy  2007    left    FAMILY HISTORY: Family History  Problem Relation Age of Onset  . Colon cancer Neg Hx     SOCIAL HISTORY: History   Social History  .  Marital Status: Single    Spouse Name: N/A    Number of Children: N/A  . Years of Education: N/A   Occupational History  . disability   Social History Main Topics  . Smoking status: Never Smoker   . Smokeless tobacco: Never Used  . Alcohol Use: No  . Drug Use: No  . Sexual Activity: Not on file   Other Topics Concern  . Not on file   Social History Narrative  . No narrative on file     PHYSICAL EXAM  Filed Vitals:   09/10/13 0908  BP:  146/90  Pulse: 95  Height: 5\' 4"  (1.626 m)  Weight: 184 lb (83.462 kg)   Body mass index is 31.57 kg/(m^2).  Generalized: Well developed, in no acute distress  Head: normocephalic and atraumatic,. Oropharynx benign . Poor dentation with many missing teeth Neck: Supple, no carotid bruits  Cardiac: Regular rate rhythm, no murmur  Musculoskeletal: No deformity   Neurological examination   Mentation: Alert oriented to time, place, history taking. Follows all commands speech and language fluent  Cranial nerve II-XII: Fundoscopic exam reveals sharp disc margins.visual acuity 20/30 bilaterally .Pupils were equal round reactive to light extraocular movements were full, visual field were full on confrontational test. Facial sensation and strength were normal. hearing was intact to finger rubbing bilaterally. Uvula tongue midline. head turning and shoulder shrug and were normal and symmetric.Tongue protrusion into cheek strength was normal. Motor: Moderate bilateral lower extremity spasticity, no significant weakness  Sensory: Dependence decreased light touch, pinprick, vibratory at toes and absent proprioception   Coordination: finger-nose-finger, heel-to-shin bilaterally, no dysmetria Reflexes: Brachioradialis 2/2, biceps 2/2, triceps 2/2, patellar 3/3, Achilles 2/2, plantar responses were extensor  bilaterally. Gait and Station: Rising up from seated position without assistance, wide base cautious gait relies on her cane , unable to tandem gait   DIAGNOSTIC DATA (LABS, IMAGING, TESTING) - None to review  ASSESSMENT AND PLAN  52 y.o. year old female  has a past medical history of Multiple sclerosis; Hypothyroidism; Hypertension; Hyperlipidemia; and Breast cancer (2007). Here for followup. She is currently on Betaseron with complaints of injection sites issues particularly on the abdomen and thighs Discussed gilenya and tecfidera with patient, testing, and current efficacy data  Will switch to  tecfidera per patient choice  Will check labs today Followup in 6 months Research and to discuss research study for side effects of Tecfidera Nilda Riggs, [facility], [facility], APRN  [facility] [address] [place], [place] [address] [phone]

## 2013-09-13 NOTE — Progress Notes (Signed)
Quick Note:  I called pt and relayed the results of her labs (stable). She stated understanding and was waiting for them to send her the tecfidera. She was still taking her betaseron. ______

## 2013-09-27 ENCOUNTER — Ambulatory Visit (INDEPENDENT_AMBULATORY_CARE_PROVIDER_SITE_OTHER): Payer: BC Managed Care – PPO

## 2013-09-27 ENCOUNTER — Telehealth: Payer: Self-pay

## 2013-09-27 DIAGNOSIS — G35 Multiple sclerosis: Secondary | ICD-10-CM

## 2013-09-27 NOTE — Telephone Encounter (Signed)
All requested info has been submitted to insurance company to try and obtain prior [place] approval for Tecfidera.  Pending ins response.

## 2013-09-27 NOTE — Progress Notes (Signed)
Patient was in today for enrollment into the ESTEEM study. Patient was given ample time to read and review the ICF. After all questions and concern was addressed consent was signed. No related study procedures was done prior to consent being signed. All required questionnaires was collected. PRO's was given to subject to take home and to answers after her first dose of Tecfidera and return the PRO's back to our office when finish. Patient was instructed to call our office if she has any questions or concerns. Patient verbalized that she understood.

## 2013-10-22 ENCOUNTER — Telehealth: Payer: Self-pay

## 2013-10-22 MED ORDER — RANITIDINE HCL 75 MG PO TABS: 75.0000 mg | ORAL_TABLET | Freq: Two times a day (BID) | ORAL | Status: DC

## 2013-10-22 NOTE — Telephone Encounter (Signed)
Patient call stating that she is having nausea, vomiting, diarrhea and pain since Tuesday 10/19/13. Wanting you to call her to advice her what to do. She started taking Tecfidera at 120 mg for 7 days on 10/04/13 then went to 240 on 10/14/13.

## 2013-10-22 NOTE — Telephone Encounter (Signed)
I have called Mariah Jackson, she had a lot of GI side effect from Tecfidera, lasting for 3-4 days, she missed the morning Tecfidera, she feels better.   Her GI reaction is most likely from Tecfidera side effect, I have advised her to take medication after meal, also take Zantac before the Tecfidera. Rx of Zantac was called in.

## 2013-12-06 ENCOUNTER — Telehealth: Payer: Self-pay | Admitting: Nurse Practitioner

## 2013-12-06 MED ORDER — DIMETHYL FUMARATE 240 MG PO CPDR: 240.0000 mg | DELAYED_RELEASE_CAPSULE | Freq: Two times a day (BID) | ORAL | Status: DC

## 2013-12-06 NOTE — Telephone Encounter (Signed)
Patient calling again to follow up on status. Patient states that insurance is not going to cover Tecfidera and patient is completely out of the medication. Please call the patient.

## 2013-12-06 NOTE — Telephone Encounter (Signed)
I have not gotten any previous messages regarding this.  I called the ins, and they said prior auth was already approved effective from 09/27/2013 through 12/29/2038 (or until policy is terminated).  I see a call from today asking that a refill be sent to [facility], which I have sent.  I called the patient.  She is aware.

## 2013-12-06 NOTE — Telephone Encounter (Signed)
Shanda Bumps do you know anything about this. I do not see where she has called in with issues with insurance

## 2013-12-06 NOTE — Telephone Encounter (Signed)
NEEDS RX CALLED IN FOR TECFIDERA--[facility]([phone])

## 2014-01-07 ENCOUNTER — Telehealth: Payer: Self-pay | Admitting: Nurse Practitioner

## 2014-01-07 NOTE — Telephone Encounter (Signed)
Pt calling because she needs an RX for Tecfidera sent to [facility]

## 2014-01-07 NOTE — Telephone Encounter (Signed)
We sent them 7 refills last month.  I called the patient.  Advised her we already sent the Rx last month.  She said she called the pharmacy and they did not have the Rx.  I called the pharmacy.  Spoke with [facility].  He said they did get the Rx in dec.  He transferred me to [place].  She said they will process Rx today and contact patient for delivery.  I called patient back.  She is aware.

## 2014-01-07 NOTE — Telephone Encounter (Signed)
Patient calling to follow up on getting Tecfidera refill, says she only has 2 pills left for tomorrow.

## 2014-02-07 ENCOUNTER — Other Ambulatory Visit: Payer: Self-pay | Admitting: Neurology

## 2014-02-07 MED ORDER — DALFAMPRIDINE ER 10 MG PO TB12: 10.0000 mg | ORAL_TABLET | Freq: Two times a day (BID) | ORAL | Status: DC

## 2014-02-07 NOTE — Telephone Encounter (Signed)
Pt called in stating she was on a support program for Ampyra and when she called for a refill they stated they would need a new prescription.  She couldn't remember the name of the company but had a fax number that the prescription should go to - [phone].  She also stated that she has new insurance - [employer].  Please call as necessary.  She has an appointment scheduled in March.

## 2014-02-07 NOTE — Telephone Encounter (Signed)
This fax number is not in our database.  I will manually fax the written Rx once it has been signed.

## 2014-02-08 NOTE — Telephone Encounter (Signed)
Rx signed and faxed.

## 2014-02-18 ENCOUNTER — Telehealth: Payer: Self-pay | Admitting: Neurology

## 2014-02-18 NOTE — Telephone Encounter (Signed)
Derk Degroot with [employer] called regarding prior authorization for Ampera for this patient.  He needs some clinical information to go along with the prior authorization.  Please contact Derk directly.  Thank you!

## 2014-02-18 NOTE — Telephone Encounter (Signed)
I spoke with Mariah Jackson.  He just wanted to let us know the patient changed ins and the new ins requires a prior auth.  I have submitted all requested info.  Pending response.

## 2014-03-10 ENCOUNTER — Ambulatory Visit: Payer: BC Managed Care – PPO | Admitting: Nurse Practitioner

## 2014-03-11 ENCOUNTER — Encounter: Payer: Self-pay | Admitting: Nurse Practitioner

## 2014-03-11 ENCOUNTER — Encounter (INDEPENDENT_AMBULATORY_CARE_PROVIDER_SITE_OTHER): Payer: Self-pay

## 2014-03-11 ENCOUNTER — Ambulatory Visit (INDEPENDENT_AMBULATORY_CARE_PROVIDER_SITE_OTHER): Payer: Medicare Other | Admitting: Nurse Practitioner

## 2014-03-11 VITALS — BP 151/86 | HR 113 | Ht 64.0 in | Wt 181.0 lb

## 2014-03-11 DIAGNOSIS — R269 Unspecified abnormalities of gait and mobility: Secondary | ICD-10-CM

## 2014-03-11 DIAGNOSIS — G35 Multiple sclerosis: Secondary | ICD-10-CM

## 2014-03-11 DIAGNOSIS — Z79899 Other long term (current) drug therapy: Secondary | ICD-10-CM

## 2014-03-11 NOTE — Progress Notes (Signed)
[facility]  PATIENT: Mariah Jackson DOB: 06-24-1961   REASON FOR VISIT: Followup for multiple sclerosis    HISTORY OF PRESENT ILLNESS:[name], 53 year old female returns for followup. She has a history of multiple sclerosis currently on tecfidera with minimal flushing. Her flushing is usually  associated with taking the medication on an empty stomach or with a light meal. She is in the [employer] study. The disease has  primarily affected her balance and gait and she ambulates with a cane. She denies any recent falls. She denies visual disturbance or sensory changes. She has not had speech or swallowing difficulties. She has urinary frequency but no loss of bowel or bladder control. She returns for reevaluation  HISTORY: diagnosed with multiple sclerosis in 2001, by Dr. Dellis Filbert at [facility]. The disease has primarily affected her balance and gait. She could only clarify one exacerbations, presenting as worsening balance difficulty, and says she has never been treated with intravenous steroids.  She was placed on Betaseron shortly after diagnosis, and has been on that drug ever since. She has not been on any other immunotherapy. Her gait difficulty had little changes over past 11 years. She ambulates with a cane, but is able to get around the house pretty well without it.  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 Most recent MRI was in January 2011,shows multiple periventricular, periatrial and subcortical white matter hyperintensities suggestive of demyelinating disease.No enhancing lesions or significant atrophy is seen. compared to MRI films from Aug 2000 there appear to be more T2 lesions in right frontal subcortical region.  She is self pay now, does not want extensive study, but she will benefit repeat MRI brain and cervical cord, if her application goes through  09/10/13: Patient returns for followup after last visit 03/11/13. She is currently on Betaseron,  denies any exacerbations of symptoms since last seen. She claims she is tired of taking the shots and does have multiple areas of lipoatrophy, especially on the abdominal area. She is ambulating with a cane. She is wishing to switch to an oral preparation  REVIEW OF SYSTEMS: Full 14 system review of systems performed and notable only for those listed, all others are neg:  Constitutional: Fatigue Cardiovascular: N/A  Ear/Nose/Throat: N/A  Skin: N/A  Eyes: N/A  Respiratory: N/A  Gastroitestinal: Urinary frequency  Hematology/Lymphatic: N/A  Endocrine: N/A Musculoskeletal:N/A  Allergy/Immunology: N/A  Neurological: N/A Psychiatric: N/A   ALLERGIES: Allergies  Allergen Reactions  . Vicodin [Hydrocodone-Acetaminophen] Nausea Only    HOME MEDICATIONS: Outpatient Prescriptions Prior to Visit  Medication Sig Dispense Refill  . amLODipine (NORVASC) 5 MG tablet Take 5 mg by mouth daily.      Marland Kitchen dalfampridine (AMPYRA) 10 MG TB12 Take 1 tablet (10 mg total) by mouth every 12 (twelve) hours.  180 tablet  3  . Dimethyl Fumarate (TECFIDERA) 240 MG CPDR Take 1 capsule (240 mg total) by mouth 2 (two) times daily.  60 capsule  6  . levothyroxine (SYNTHROID, LEVOTHROID) 100 MCG tablet Take 100 mcg by mouth daily before breakfast.      . Multiple Vitamin (MULTIVITAMIN) tablet Take 1 tablet by mouth daily.      . rosuvastatin (CRESTOR) 10 MG tablet Take 10 mg by mouth daily.      . ranitidine (ZANTAC 75) 75 MG tablet Take 1 tablet (75 mg total) by mouth 2 (two) times daily.  60 tablet  12   No facility-administered medications prior to visit.  PAST MEDICAL HISTORY: Past Medical History  Diagnosis Date  . Multiple sclerosis   . Hypothyroidism   . Hypertension   . Hyperlipidemia   . Breast cancer 2007    left    PAST SURGICAL HISTORY: Past Surgical History  Procedure Laterality Date  . Tubal ligation  1985  . Breast lumpectomy  2007    left    FAMILY HISTORY: Family History    Problem Relation Age of Onset  . Colon cancer Neg Hx     SOCIAL HISTORY: History   Social History  . Marital Status: Divorced    Spouse Name: N/A    Number of Children: 2  . Years of Education: 12   Occupational History  . Unemployed    . Disabiliyt    Social History Main Topics  . Smoking status: Never Smoker   . Smokeless tobacco: Never Used  . Alcohol Use: No  . Drug Use: No  . Sexual Activity: Not on file   Other Topics Concern  . Not on file   Social History Narrative   Patient lives at home with her daughter and son.    Patient has 2 children.    Patient is divorced.    Patient a high school education.    Patient is unemployed.      PHYSICAL EXAM  Filed Vitals:   03/11/14 0922  BP: 151/86  Pulse: 113  Height: 5\' 4"  (1.626 m)  Weight: 181 lb (82.101 kg)   Body mass index is 31.05 kg/(m^2).  Generalized: Well developed, in no acute distress  Head: normocephalic and atraumatic,. Oropharynx benign , poor dentation with many missing teeth Neck: Supple, no carotid bruits  Cardiac: Regular rate rhythm, no murmur  Musculoskeletal: No deformity   Neurological examination   Mentation: Alert oriented to time, place, history taking. Follows all commands speech and language fluent  Cranial nerve II-XII: Fundoscopic exam reveals sharp disc margins.visual acuity 20/40 right, 20/30 left .Pupils were equal round reactive to light extraocular movements were full, visual field were full on confrontational test. Facial sensation and strength were normal. hearing was intact to finger rubbing bilaterally. Uvula tongue midline. head turning and shoulder shrug were normal and symmetric.Tongue protrusion into cheek strength was normal. Motor: Moderate bilateral lower extremity spasticity without significant weakness  Sensory: Decreased light touch, pinprick and vibratory sense in the toes, absent proprioception   Coordination: finger-nose-finger, heel-to-shin bilaterally,  no dysmetria Reflexes: Brachioradialis 2/2, biceps 2/2, triceps 2/2, patellar 3/3, Achilles 2/2, plantar responses were flexor bilaterally. Gait and Station: Rising up from seated position without assistance, normal stance,  wide based cautious gait relies on her cane. Tandem gait is unsteady  DIAGNOSTIC DATA (LABS, IMAGING, TESTING) - I reviewed patient records, labs, notes, testing and imaging myself where available.  Lab Results  Component Value Date   WBC 4.4 09/10/2013   HGB 14.3 09/10/2013   HCT 41.3 09/10/2013   MCV 76* 09/10/2013   PLT 272 12/21/2007      Component Value Date/Time   NA 142 09/10/2013 1000   K 3.6 09/10/2013 1000   CL 106 09/10/2013 1000   CO2 25 09/10/2013 1000   GLUCOSE 94 09/10/2013 1000   BUN 13 09/10/2013 1000   CREATININE 0.81 09/10/2013 1000   CALCIUM 11.1* 09/10/2013 1000   PROT 7.3 09/10/2013 1000   AST 21 09/10/2013 1000   ALT 17 09/10/2013 1000   ALKPHOS 158* 09/10/2013 1000   BILITOT 0.6 09/10/2013 1000   GFRNONAA 83 09/10/2013  [place] 09/10/2013 1000    ASSESSMENT AND PLAN  53 y.o. year old female  has a past medical history of Multiple sclerosis; Hypothyroidism; Hypertension; Hyperlipidemia; and Breast cancer (2007). here to followup for multiple sclerosis. She is currently on tecfidera without significant side effects  Continue tecfidera twice a day with full meal Check labs today CBC CMP Followup in 6 months Dennie Bible, [facility], [facility], APRN  [facility] [address] [place], [place] [address] [phone]

## 2014-03-11 NOTE — Patient Instructions (Signed)
Continue tecfidera twice a day with full meal Check labs today CBC CMP Followup in 6 months

## 2014-03-12 LAB — CBC WITH DIFFERENTIAL/PLATELET
BASOS: 0 %
Basophils Absolute: 0 10*3/uL (ref 0.0–0.2)
EOS: 1 %
Eosinophils Absolute: 0.1 10*3/uL (ref 0.0–0.4)
HEMATOCRIT: 40.1 % (ref 34.0–46.6)
Hemoglobin: 14.2 g/dL (ref 11.1–15.9)
IMMATURE GRANS (ABS): 0 10*3/uL (ref 0.0–0.1)
Immature Granulocytes: 0 %
LYMPHS ABS: 1 10*3/uL (ref 0.7–3.1)
Lymphs: 15 %
MCH: 25.7 pg — ABNORMAL LOW (ref 26.6–33.0)
MCHC: 35.4 g/dL (ref 31.5–35.7)
MCV: 73 fL — AB (ref 79–97)
MONOCYTES: 8 %
MONOS ABS: 0.5 10*3/uL (ref 0.1–0.9)
NEUTROS PCT: 76 %
Neutrophils Absolute: 5 10*3/uL (ref 1.4–7.0)
RBC: 5.53 x10E6/uL — AB (ref 3.77–5.28)
RDW: 15.4 % (ref 12.3–15.4)
WBC: 6.6 10*3/uL (ref 3.4–10.8)

## 2014-03-12 LAB — COMPREHENSIVE METABOLIC PANEL
ALK PHOS: 143 IU/L — AB (ref 39–117)
ALT: 11 IU/L (ref 0–32)
AST: 14 IU/L (ref 0–40)
Albumin/Globulin Ratio: 1.4 (ref 1.1–2.5)
Albumin: 4.6 g/dL (ref 3.5–5.5)
BILIRUBIN TOTAL: 0.6 mg/dL (ref 0.0–1.2)
BUN/Creatinine Ratio: 17 (ref 9–23)
BUN: 15 mg/dL (ref 6–24)
CHLORIDE: 102 mmol/L (ref 97–108)
CO2: 22 mmol/L (ref 18–29)
CREATININE: 0.88 mg/dL (ref 0.57–1.00)
Calcium: 10.9 mg/dL — ABNORMAL HIGH (ref 8.7–10.2)
GFR calc Af Amer: 86 mL/min/{1.73_m2} (ref >59)
GFR, EST NON AFRICAN AMERICAN: 74 mL/min/{1.73_m2} (ref >59)
Globulin, Total: 3.2 g/dL (ref 1.5–4.5)
Glucose: 105 mg/dL — ABNORMAL HIGH (ref 65–99)
POTASSIUM: 3.8 mmol/L (ref 3.5–5.2)
SODIUM: 142 mmol/L (ref 134–144)
TOTAL PROTEIN: 7.8 g/dL (ref 6.0–8.5)

## 2014-04-21 ENCOUNTER — Other Ambulatory Visit: Payer: Self-pay | Admitting: Family Medicine

## 2014-04-21 DIAGNOSIS — E041 Nontoxic single thyroid nodule: Secondary | ICD-10-CM

## 2014-04-21 DIAGNOSIS — E039 Hypothyroidism, unspecified: Secondary | ICD-10-CM

## 2014-04-26 ENCOUNTER — Ambulatory Visit
Admission: RE | Admit: 2014-04-26 | Discharge: 2014-04-26 | Disposition: A | Discharge status: Home / Self Care | Payer: Commercial Managed Care - HMO | Source: Ambulatory Visit | Attending: Family Medicine | Admitting: Family Medicine

## 2014-04-26 ENCOUNTER — Other Ambulatory Visit: Payer: Self-pay

## 2014-04-26 ENCOUNTER — Encounter (INDEPENDENT_AMBULATORY_CARE_PROVIDER_SITE_OTHER): Payer: Self-pay

## 2014-04-26 DIAGNOSIS — E039 Hypothyroidism, unspecified: Secondary | ICD-10-CM

## 2014-05-26 ENCOUNTER — Telehealth: Payer: Self-pay

## 2014-05-26 NOTE — Telephone Encounter (Signed)
[employer] sent [place] a letter saying they have approved our request for coverage on Ampyra effective until 05/24/2016.

## 2014-06-01 ENCOUNTER — Ambulatory Visit (INDEPENDENT_AMBULATORY_CARE_PROVIDER_SITE_OTHER): Payer: Medicare PPO | Admitting: Surgery

## 2014-07-04 ENCOUNTER — Ambulatory Visit (INDEPENDENT_AMBULATORY_CARE_PROVIDER_SITE_OTHER): Payer: Medicare PPO | Admitting: Surgery

## 2014-08-30 ENCOUNTER — Other Ambulatory Visit (HOSPITAL_COMMUNITY): Payer: Self-pay | Admitting: Family Medicine

## 2014-08-30 DIAGNOSIS — Z853 Personal history of malignant neoplasm of breast: Secondary | ICD-10-CM

## 2014-09-12 ENCOUNTER — Ambulatory Visit: Payer: Medicare Other | Admitting: Neurology

## 2014-09-15 ENCOUNTER — Ambulatory Visit (HOSPITAL_COMMUNITY)
Admission: RE | Admit: 2014-09-15 | Discharge: 2014-09-15 | Disposition: A | Discharge status: Home / Self Care | Payer: Medicare HMO | Source: Ambulatory Visit | Attending: Family Medicine | Admitting: Family Medicine

## 2014-09-15 ENCOUNTER — Ambulatory Visit (HOSPITAL_COMMUNITY): Payer: Commercial Managed Care - HMO

## 2014-09-15 DIAGNOSIS — Z1231 Encounter for screening mammogram for malignant neoplasm of breast: Secondary | ICD-10-CM | POA: Insufficient documentation

## 2014-09-15 DIAGNOSIS — Z853 Personal history of malignant neoplasm of breast: Secondary | ICD-10-CM

## 2014-09-16 ENCOUNTER — Encounter: Payer: Self-pay | Admitting: Neurology

## 2014-09-16 ENCOUNTER — Ambulatory Visit (INDEPENDENT_AMBULATORY_CARE_PROVIDER_SITE_OTHER): Payer: Medicare HMO | Admitting: Neurology

## 2014-09-16 ENCOUNTER — Encounter (INDEPENDENT_AMBULATORY_CARE_PROVIDER_SITE_OTHER): Payer: Self-pay

## 2014-09-16 VITALS — BP 139/89 | HR 75 | Ht 64.0 in | Wt 190.0 lb

## 2014-09-16 DIAGNOSIS — R269 Unspecified abnormalities of gait and mobility: Secondary | ICD-10-CM

## 2014-09-16 DIAGNOSIS — G35 Multiple sclerosis: Secondary | ICD-10-CM

## 2014-09-16 LAB — CBC WITH DIFFERENTIAL
BASOS ABS: 0 10*3/uL (ref 0.0–0.2)
BASOS: 0 %
Eos: 3 %
Eosinophils Absolute: 0.1 10*3/uL (ref 0.0–0.4)
HEMATOCRIT: 39.1 % (ref 34.0–46.6)
HEMOGLOBIN: 13.8 g/dL (ref 11.1–15.9)
LYMPHS: 20 %
Lymphocytes Absolute: 0.8 10*3/uL (ref 0.7–3.1)
MCH: 26.7 pg (ref 26.6–33.0)
MCHC: 35.3 g/dL (ref 31.5–35.7)
MCV: 76 fL — ABNORMAL LOW (ref 79–97)
MONOCYTES: 8 %
Monocytes Absolute: 0.3 10*3/uL (ref 0.1–0.9)
NEUTROS ABS: 2.8 10*3/uL (ref 1.4–7.0)
Neutrophils Relative %: 69 %
Platelets: 227 10*3/uL (ref 150–379)
RBC: 5.17 x10E6/uL (ref 3.77–5.28)
RDW: 15.4 % (ref 12.3–15.4)
WBC: 4 10*3/uL (ref 3.4–10.8)

## 2014-09-16 LAB — COMPREHENSIVE METABOLIC PANEL
ALT: 16 IU/L (ref 0–32)
AST: 19 IU/L (ref 0–40)
Albumin/Globulin Ratio: 1.8 (ref 1.1–2.5)
Albumin: 4.5 g/dL (ref 3.5–5.5)
Alkaline Phosphatase: 149 IU/L — ABNORMAL HIGH (ref 39–117)
BUN/Creatinine Ratio: 11 (ref 9–23)
BUN: 10 mg/dL (ref 6–24)
CALCIUM: 10.3 mg/dL — AB (ref 8.7–10.2)
CHLORIDE: 103 mmol/L (ref 96–108)
CO2: 27 mmol/L (ref 18–29)
Creatinine, Ser: 0.94 mg/dL (ref 0.57–1.00)
GFR calc Af Amer: 79 mL/min/{1.73_m2} (ref >59)
GFR calc non Af Amer: 69 mL/min/{1.73_m2} (ref >59)
GLUCOSE: 100 mg/dL — AB (ref 65–99)
Globulin, Total: 2.5 g/dL (ref 1.5–4.5)
POTASSIUM: 3.7 mmol/L (ref 3.5–5.2)
Sodium: 139 mmol/L (ref 134–144)
TOTAL PROTEIN: 7 g/dL (ref 6.0–8.5)
Total Bilirubin: 0.7 mg/dL (ref 0.0–1.2)

## 2014-09-16 NOTE — Progress Notes (Signed)
[facility]  PATIENT: Mariah Jackson DOB: May 19, 1961  HISTORY OF PRESENT ILLNESS:[name], 53 year old female returns for followup.   She was diagnosed with multiple sclerosis in 2001, by Dr. Dellis Filbert at [facility]. The disease has primarily affected her balance and gait. She could only clarify one exacerbations, presenting as worsening balance difficulty, and says she has never been treated with intravenous steroids.   She was placed on Betaseron shortly after diagnosis, and has been on that drug ever since.  Her gait difficulty had little changes over past 11 years. She ambulates with a cane, but is able to get around the house pretty well without it.    Most recent MRI was in January 2011,shows multiple periventricular, periatrial and subcortical white matter hyperintensities suggestive of demyelinating disease.No enhancing lesions or significant atrophy is seen. compared to MRI films from Aug 2000 there appear to be more T2 lesions in right frontal subcortical region.   She is tired of taking the shots and does have multiple areas of lipoatrophy, especially on the abdominal area. She is ambulating with a cane. She is wishing to switch to an oral preparation, was switched to [facility] Oct 2014, doing well.  UPDATE Sep 18th 2015: She denies signficant side effect from Tecfidera.  She has been on Ampyra 10mg  bid for few years, which definitely has helped her walking  MRI brain March 2014: Multiple periventricular and subcortical chronic demyelinating disease. No acute plaques are seen. MRI cervical spine: Disc bulging at C5-6. Spondylosis at C5-6 and C6-7. C5-6 there is focal disc protrusion slightly deforms anterior spinal cord, with spinal stenosis or foraminal stenosis.  No intrinsic or enhancing spinal cord lesions.  REVIEW OF SYSTEMS: Full 14 system review of systems performed and notable only for those listed, all others are neg:     ALLERGIES: Allergies  Allergen  Reactions  . Vicodin [Hydrocodone-Acetaminophen] Nausea Only    HOME MEDICATIONS: Outpatient Prescriptions Prior to Visit  Medication Sig Dispense Refill  . amLODipine (NORVASC) 5 MG tablet Take 5 mg by mouth daily.      Marland Kitchen dalfampridine (AMPYRA) 10 MG TB12 Take 1 tablet (10 mg total) by mouth every 12 (twelve) hours.  180 tablet  3  . Dimethyl Fumarate (TECFIDERA) 240 MG CPDR Take 1 capsule (240 mg total) by mouth 2 (two) times daily.  60 capsule  6  . levothyroxine (SYNTHROID, LEVOTHROID) 100 MCG tablet Take 100 mcg by mouth daily before breakfast.      . Multiple Vitamin (MULTIVITAMIN) tablet Take 1 tablet by mouth daily.      . rosuvastatin (CRESTOR) 10 MG tablet Take 10 mg by mouth daily.       No facility-administered medications prior to visit.    PAST MEDICAL HISTORY: Past Medical History  Diagnosis Date  . Multiple sclerosis   . Hypothyroidism   . Hypertension   . Hyperlipidemia   . Breast cancer 2007    left    PAST SURGICAL HISTORY: Past Surgical History  Procedure Laterality Date  . Tubal ligation  1985  . Breast lumpectomy  2007    left    FAMILY HISTORY: Family History  Problem Relation Age of Onset  . Colon cancer Neg Hx   . Heart attack Father     SOCIAL HISTORY: History   Social History  . Marital Status: Divorced    Spouse Name: N/A    Number of Children: 2  . Years of Education: 12   Occupational History  . Unemployed    .  Disabiliyt    Social History Main Topics  . Smoking status: Never Smoker   . Smokeless tobacco: Never Used  . Alcohol Use: No  . Drug Use: No  . Sexual Activity: No   Other Topics Concern  . Not on file   Social History Narrative   Patient lives at home with her daughter and son.    Patient has 2 children.    Patient is divorced.    Patient a high school education.    Patient is unemployed.      PHYSICAL EXAM  Filed Vitals:   09/16/14 1304  BP: 139/89  Pulse: 75  Height: 5\' 4"  (1.626 m)  Weight: 190  lb (86.183 kg)   Body mass index is 32.6 kg/(m^2).  Generalized: Well developed, in no acute distress  Head: normocephalic and atraumatic,. Oropharynx benign , poor dentation with many missing teeth Neck: Supple, no carotid bruits  Cardiac: Regular rate rhythm, no murmur  Musculoskeletal: No deformity   Neurological examination   Mentation: Alert oriented to time, place, history taking. Follows all commands speech and language fluent  Cranial nerve II-XII: Fundoscopic exam reveals sharp disc margins.visual acuity 20/40 right, 20/30 left .Pupils were equal round reactive to light extraocular movements were full, visual field were full on confrontational test. Facial sensation and strength were normal. hearing was intact to finger rubbing bilaterally. Uvula tongue midline. head turning and shoulder shrug were normal and symmetric.Tongue protrusion into cheek strength was normal. Motor: Moderate bilateral lower extremity spasticity without significant weakness  Sensory: Decreased light touch, pinprick and vibratory sense in the toes, absent proprioception   Coordination: finger-nose-finger, heel-to-shin bilaterally, no dysmetria Reflexes: Brachioradialis 2/2, biceps 2/2, triceps 2/2, patellar 3/3, Achilles 2/2, plantar responses were flexor bilaterally. Gait and Station:   wide based, stance, cautious gait, relies on her cane.   DIAGNOSTIC DATA (LABS, IMAGING, TESTING) - I reviewed patient records, labs, notes, testing and imaging myself where available.  Lab Results  Component Value Date   WBC 6.6 03/11/2014   HGB 14.2 03/11/2014   HCT 40.1 03/11/2014   MCV 73* 03/11/2014   PLT 272 12/21/2007      Component Value Date/Time   NA 142 03/11/2014 1003   K 3.8 03/11/2014 1003   CL 102 03/11/2014 1003   CO2 22 03/11/2014 1003   GLUCOSE 105* 03/11/2014 1003   BUN 15 03/11/2014 1003   CREATININE 0.88 03/11/2014 1003   CALCIUM 10.9* 03/11/2014 1003   PROT 7.8 03/11/2014 1003   AST 14 03/11/2014  1003   ALT 11 03/11/2014 1003   ALKPHOS 143* 03/11/2014 1003   BILITOT 0.6 03/11/2014 1003   GFRNONAA 74 03/11/2014 1003   GFRAA 86 03/11/2014 1003    ASSESSMENT AND PLAN  53 y.o. year old female  has a past medical history of Multiple sclerosis; Hypothyroidism; Hypertension; Hyperlipidemia; and Breast cancer   Continue tecfidera twice a day, Ampyra 10 mg twice a day Check labs today CBC CMP Followup in 6 months   Marcial Pacas, M.D.  [facility] [address] [place], [place] [address] [phone]

## 2014-09-19 ENCOUNTER — Other Ambulatory Visit (INDEPENDENT_AMBULATORY_CARE_PROVIDER_SITE_OTHER): Payer: Self-pay

## 2014-09-19 DIAGNOSIS — E041 Nontoxic single thyroid nodule: Secondary | ICD-10-CM

## 2014-09-20 NOTE — Progress Notes (Signed)
Quick Note:  Called and spoke to patient normal labs. ______

## 2014-09-28 ENCOUNTER — Other Ambulatory Visit (HOSPITAL_COMMUNITY)
Admission: RE | Admit: 2014-09-28 | Discharge: 2014-09-28 | Disposition: A | Discharge status: Home / Self Care | Payer: Medicare HMO | Source: Ambulatory Visit | Attending: Interventional Radiology | Admitting: Interventional Radiology

## 2014-09-28 ENCOUNTER — Ambulatory Visit
Admission: RE | Admit: 2014-09-28 | Discharge: 2014-09-28 | Disposition: A | Discharge status: Home / Self Care | Payer: Commercial Managed Care - HMO | Source: Ambulatory Visit | Attending: Surgery | Admitting: Surgery

## 2014-09-28 DIAGNOSIS — E041 Nontoxic single thyroid nodule: Secondary | ICD-10-CM | POA: Diagnosis present

## 2014-10-03 ENCOUNTER — Other Ambulatory Visit (INDEPENDENT_AMBULATORY_CARE_PROVIDER_SITE_OTHER): Payer: Self-pay

## 2014-10-03 DIAGNOSIS — E042 Nontoxic multinodular goiter: Secondary | ICD-10-CM

## 2014-11-07 ENCOUNTER — Ambulatory Visit (INDEPENDENT_AMBULATORY_CARE_PROVIDER_SITE_OTHER): Payer: Medicare HMO | Admitting: Family Medicine

## 2014-11-07 ENCOUNTER — Ambulatory Visit (INDEPENDENT_AMBULATORY_CARE_PROVIDER_SITE_OTHER): Payer: Medicare HMO

## 2014-11-07 VITALS — BP 140/90 | HR 90 | Temp 98.5°F | Ht 64.0 in | Wt 189.0 lb

## 2014-11-07 DIAGNOSIS — E785 Hyperlipidemia, unspecified: Secondary | ICD-10-CM

## 2014-11-07 DIAGNOSIS — Z23 Encounter for immunization: Secondary | ICD-10-CM

## 2014-11-07 DIAGNOSIS — Z7689 Persons encountering health services in other specified circumstances: Secondary | ICD-10-CM

## 2014-11-07 DIAGNOSIS — G35 Multiple sclerosis: Secondary | ICD-10-CM

## 2014-11-07 DIAGNOSIS — Z7189 Other specified counseling: Secondary | ICD-10-CM

## 2014-11-07 DIAGNOSIS — E038 Other specified hypothyroidism: Secondary | ICD-10-CM

## 2014-11-07 DIAGNOSIS — Z9889 Other specified postprocedural states: Secondary | ICD-10-CM

## 2014-11-07 DIAGNOSIS — I10 Essential (primary) hypertension: Secondary | ICD-10-CM

## 2014-11-07 DIAGNOSIS — E049 Nontoxic goiter, unspecified: Secondary | ICD-10-CM

## 2014-11-07 NOTE — Patient Instructions (Addendum)
Exercise to Lose Weight Exercise and a healthy diet may help you lose weight. Your doctor may suggest specific exercises. EXERCISE IDEAS AND TIPS  Choose low-cost things you enjoy doing, such as walking, bicycling, or exercising to workout videos.  Take stairs instead of the elevator.  Walk during your lunch break.  Park your car further away from work or school.  Go to a gym or an exercise class.  Start with 5 to 10 minutes of exercise each day. Build up to 30 minutes of exercise 4 to 6 days a week.  Wear shoes with good support and comfortable clothes.  Stretch before and after working out.  Work out until you breathe harder and your heart beats faster.  Drink extra water when you exercise.  Do not do so much that you hurt yourself, feel dizzy, or get very short of breath. Exercises that burn about 150 calories:  Running 1  miles in 15 minutes.  Playing volleyball for 45 to 60 minutes.  Washing and waxing a car for 45 to 60 minutes.  Playing touch football for 45 minutes.  Walking 1  miles in 35 minutes.  Pushing a stroller 1  miles in 30 minutes.  Playing basketball for 30 minutes.  Raking leaves for 30 minutes.  Bicycling 5 miles in 30 minutes.  Walking 2 miles in 30 minutes.  Dancing for 30 minutes.  Shoveling snow for 15 minutes.  Swimming laps for 20 minutes.  Walking up stairs for 15 minutes.  Bicycling 4 miles in 15 minutes.  Gardening for 30 to 45 minutes.  Jumping rope for 15 minutes.  Washing windows or floors for 45 to 60 minutes. Document Released: 01/18/2011 Document Revised: 03/09/2012 Document Reviewed: 01/18/2011 [facility] Patient Information 2015 [place], [place]. This information is not intended to replace advice given to you by your health care provider. Make sure you discuss any questions you have with your health care provider.   Thanks for coming in today!!  We won't change anything today, but will have you follow up in 2  months for blood work and health maintenance.   Great to meet you!  Call anytime if you need anything at all.   Thanks for letting us take care of you.   Sincerely,  Paula Compton, MD Family Medicine PGY - 1

## 2014-11-07 NOTE — Progress Notes (Signed)
Patient ID: Mariah Jackson, female   DOB: 08/25/1961, 53 y.o.   MRN: [id number]   [facility] Aquilla Hacker, MD Phone: [phone]  Subjective:   # Pt. Here to Establish Care  - review of medical problems below.   - Hx of Breast Lump s/p lumpectomy in 2007. Surgical path reports fibrosed tissue without malignancy. Last Mammography 08/2014 - BIRADS 1. Per pt. She underwent radiation treatment, but unclear as to indication for this, or whether this was part of her treatment protocol. Followed by Dr. Kennon Holter.   - Hx of thyroid enlargement and thyroid FNA with negative path result.   - Hypothyroidism currently on Synthroid. Unsure of last TSH.   - Multiple Sclerosis: diagnosed in 2001. Has had a very benign course with symptoms strictly isolated to balance difficulty and right arm weakness / hand numbness. She denies vision changes, or other areas of weakness / numbness. Followed by Dr. Krista Blue (Neurology), and on Ampyra as well as Tecfidera. Last MRI 02/2013 showing no acute plaques, but old sclerotic lesions.   - HTN - currently on amlodipine 5mg  daily. She reports no side effects. She says that she takes her blood pressure at home via wrist cuff, and that it is typically 140's / 90's. She denies vision changes, or palpitations at this time.   - HLD - Pt. With strong family history of HLD. Currently on Crestor 10mg  daily. She does not remember the last time that she has had her lipids checked. Father with hx of stroke, mom and brothers with HLD.   - Arthritis - Pt. With bilateral arthritic changes of the knees. She reports taking advil daily to every other day to help control the pain. She has had joint injections in both knees approximately one year ago which she felt helped tremendously.   - Health Maintenance. : Does not remember last pap smear, Colonoscopy 2 years ago, Mammogram up to date, Needs flu vaccine and TDAP   All relevant systems were reviewed and were negative  unless otherwise noted in the HPI  Past Medical History Reviewed problem list.  Medications- reviewed and updated Current Outpatient Prescriptions  Medication Sig Dispense Refill  . amLODipine (NORVASC) 5 MG tablet Take 5 mg by mouth daily.    Marland Kitchen dalfampridine (AMPYRA) 10 MG TB12 Take 1 tablet (10 mg total) by mouth every 12 (twelve) hours. 180 tablet 3  . Dimethyl Fumarate (TECFIDERA) 240 MG CPDR Take 1 capsule (240 mg total) by mouth 2 (two) times daily. 60 capsule 6  . levothyroxine (SYNTHROID, LEVOTHROID) 100 MCG tablet Take 100 mcg by mouth daily before breakfast.    . Multiple Vitamin (MULTIVITAMIN) tablet Take 1 tablet by mouth daily.    . rosuvastatin (CRESTOR) 10 MG tablet Take 10 mg by mouth daily.     No current facility-administered medications for this visit.   Chief complaint-noted No additions to family history Social history- patient is a Never smoker  Objective: BP 140/90 mmHg  Pulse 90  Temp(Src) 98.5 F (36.9 C) (Oral)  Ht 5\' 4"  (1.626 m)  Wt 189 lb (85.73 kg)  BMI 32.43 kg/m2 Gen: NAD, alert, cooperative with exam HEENT: NCAT, EOMI, PERRL Neck: FROM, supple, No thyromegally noted, no nodules.  CV: RRR, good S1/S2, no murmur, No TTP  Resp: CTABL, no wheezes, non-labored Abd: SNTND, BS present, no guarding or organomegaly Ext: No edema, warm, normal tone, moves UE/LE spontaneously Neuro: Alert and oriented, No gross deficits, Full u/ le motor, no sensory  deficits noted, EOMI, PERRLA, Gait - able to ambulate without assistance.  Skin: no rashes no lesions  Assessment/Plan: See problem based a/p

## 2014-11-09 ENCOUNTER — Encounter: Payer: Self-pay | Admitting: Family Medicine

## 2014-11-09 DIAGNOSIS — I10 Essential (primary) hypertension: Secondary | ICD-10-CM | POA: Insufficient documentation

## 2014-11-09 DIAGNOSIS — Z9889 Other specified postprocedural states: Secondary | ICD-10-CM | POA: Insufficient documentation

## 2014-11-09 DIAGNOSIS — E049 Nontoxic goiter, unspecified: Secondary | ICD-10-CM

## 2014-11-09 DIAGNOSIS — E7800 Pure hypercholesterolemia, unspecified: Secondary | ICD-10-CM

## 2014-11-09 DIAGNOSIS — E039 Hypothyroidism, unspecified: Secondary | ICD-10-CM | POA: Insufficient documentation

## 2014-11-09 HISTORY — DX: Other specified postprocedural states: Z98.890

## 2014-11-09 HISTORY — DX: Essential (primary) hypertension: I10

## 2014-11-09 HISTORY — DX: Nontoxic goiter, unspecified: E04.9

## 2014-11-09 HISTORY — DX: Pure hypercholesterolemia, unspecified: E78.00

## 2014-11-09 NOTE — Assessment & Plan Note (Signed)
A: Pt. With mildly elevated BP 142 / 91. Would like to see it slightly lower especially since she says that it is consistently in the low 209'O systolic at home.   P:  - Will have her return for a nurse's visit and blood pressure check.  - Return visit in 1 month.  - If BP still elevated will increase her amlodipine dose to 10mg .

## 2014-11-09 NOTE — Assessment & Plan Note (Signed)
A: Pt. Diagnosed with MS in 2001. She is followed by Dr. Krista Blue (neurology). She has not had any progression, or major flairs since her diagnosis. She continues to have stable plaques on her last MRI (2014) without interval changes since then.   P:  - Continue to follow up with Dr. Krista Blue - No need for any intervention at this time.  - Weakness seems to be subjective, as on physical exam she had full strength of all extremities.  - Gait is her main problem, as she has balance difficulty due to the location of the sclerotic plaques in her CNS.

## 2014-11-30 ENCOUNTER — Other Ambulatory Visit: Payer: Self-pay | Admitting: *Deleted

## 2014-11-30 DIAGNOSIS — I10 Essential (primary) hypertension: Secondary | ICD-10-CM

## 2014-11-30 DIAGNOSIS — E039 Hypothyroidism, unspecified: Secondary | ICD-10-CM

## 2014-11-30 MED ORDER — LEVOTHYROXINE SODIUM 100 MCG PO TABS: 100.0000 ug | ORAL_TABLET | Freq: Every day | ORAL | Status: DC

## 2014-11-30 MED ORDER — AMLODIPINE BESYLATE 5 MG PO TABS: 5.0000 mg | ORAL_TABLET | Freq: Every day | ORAL | Status: DC

## 2014-11-30 NOTE — Telephone Encounter (Signed)
Refilled. Thanks  CGM

## 2015-02-09 ENCOUNTER — Telehealth: Payer: Self-pay

## 2015-02-09 MED ORDER — CELECOXIB 100 MG PO CAPS: 100.0000 mg | ORAL_CAPSULE | Freq: Two times a day (BID) | ORAL | Status: DC

## 2015-02-09 NOTE — Telephone Encounter (Signed)
Patient had a fall on Monday and and she had slick soaks on . Patient states she is ok . Patient states her right shoulder is hurting her bad. Patient states she wants Dr.Yan to know because she has MS. Patient can't come in for a sooner apt. Because she does not have money for co pay until March. Patient wants to know if Dr.Yan will call her in something.

## 2015-02-09 NOTE — Telephone Encounter (Signed)
Spoke to [facility] - she is aware that the Celebrex has been sent to the pharmacy.  She will keep her f/u appt in March.

## 2015-02-09 NOTE — Telephone Encounter (Signed)
Michelle: Please let patient's know, I have called in Celebrex 100 mg twice a day to her pharmacy, keep follow-up in March

## 2015-02-20 ENCOUNTER — Other Ambulatory Visit: Payer: Self-pay | Admitting: Family Medicine

## 2015-02-20 DIAGNOSIS — E785 Hyperlipidemia, unspecified: Secondary | ICD-10-CM

## 2015-02-20 MED ORDER — ROSUVASTATIN CALCIUM 10 MG PO TABS: 10.0000 mg | ORAL_TABLET | Freq: Every day | ORAL | Status: DC

## 2015-02-20 NOTE — Telephone Encounter (Signed)
Refilled.    CGM 

## 2015-02-20 NOTE — Telephone Encounter (Signed)
Pt called and needs a refill on her Crestor. Please call in to her online pharmacy. jw

## 2015-03-12 ENCOUNTER — Emergency Department (HOSPITAL_COMMUNITY)
Admission: EM | Admit: 2015-03-12 | Discharge: 2015-03-12 | Disposition: A | Discharge status: Home / Self Care | Payer: Commercial Managed Care - HMO | Attending: Emergency Medicine | Admitting: Emergency Medicine

## 2015-03-12 ENCOUNTER — Encounter (HOSPITAL_COMMUNITY): Payer: Self-pay | Admitting: Emergency Medicine

## 2015-03-12 ENCOUNTER — Emergency Department (HOSPITAL_COMMUNITY): Payer: Commercial Managed Care - HMO

## 2015-03-12 DIAGNOSIS — Z79899 Other long term (current) drug therapy: Secondary | ICD-10-CM | POA: Diagnosis not present

## 2015-03-12 DIAGNOSIS — M79621 Pain in right upper arm: Secondary | ICD-10-CM | POA: Diagnosis not present

## 2015-03-12 DIAGNOSIS — S60221A Contusion of right hand, initial encounter: Secondary | ICD-10-CM | POA: Diagnosis not present

## 2015-03-12 DIAGNOSIS — Y9289 Other specified places as the place of occurrence of the external cause: Secondary | ICD-10-CM | POA: Diagnosis not present

## 2015-03-12 DIAGNOSIS — Y9389 Activity, other specified: Secondary | ICD-10-CM | POA: Diagnosis not present

## 2015-03-12 DIAGNOSIS — Z87828 Personal history of other (healed) physical injury and trauma: Secondary | ICD-10-CM | POA: Insufficient documentation

## 2015-03-12 DIAGNOSIS — M25511 Pain in right shoulder: Secondary | ICD-10-CM

## 2015-03-12 DIAGNOSIS — I10 Essential (primary) hypertension: Secondary | ICD-10-CM | POA: Insufficient documentation

## 2015-03-12 DIAGNOSIS — G35 Multiple sclerosis: Secondary | ICD-10-CM | POA: Diagnosis not present

## 2015-03-12 DIAGNOSIS — E039 Hypothyroidism, unspecified: Secondary | ICD-10-CM | POA: Diagnosis not present

## 2015-03-12 DIAGNOSIS — Z853 Personal history of malignant neoplasm of breast: Secondary | ICD-10-CM | POA: Diagnosis not present

## 2015-03-12 DIAGNOSIS — Z791 Long term (current) use of non-steroidal anti-inflammatories (NSAID): Secondary | ICD-10-CM | POA: Diagnosis not present

## 2015-03-12 DIAGNOSIS — S4991XA Unspecified injury of right shoulder and upper arm, initial encounter: Secondary | ICD-10-CM | POA: Diagnosis not present

## 2015-03-12 DIAGNOSIS — W1839XA Other fall on same level, initial encounter: Secondary | ICD-10-CM | POA: Insufficient documentation

## 2015-03-12 DIAGNOSIS — R52 Pain, unspecified: Secondary | ICD-10-CM

## 2015-03-12 DIAGNOSIS — Y998 Other external cause status: Secondary | ICD-10-CM | POA: Diagnosis not present

## 2015-03-12 DIAGNOSIS — E785 Hyperlipidemia, unspecified: Secondary | ICD-10-CM | POA: Insufficient documentation

## 2015-03-12 MED ORDER — MORPHINE SULFATE 4 MG/ML IJ SOLN
4.0000 mg | Freq: Once | INTRAMUSCULAR | Status: AC
  Administered 2015-03-12: 4 mg via INTRAMUSCULAR
  Filled 2015-03-12: qty 1

## 2015-03-12 MED ORDER — OXYCODONE-ACETAMINOPHEN 5-325 MG PO TABS: ORAL_TABLET | ORAL | Status: DC

## 2015-03-12 MED ORDER — ONDANSETRON 4 MG PO TBDP
4.0000 mg | ORAL_TABLET | Freq: Once | ORAL | Status: AC
  Administered 2015-03-12: 4 mg via ORAL
  Filled 2015-03-12: qty 1

## 2015-03-12 MED ORDER — METHOCARBAMOL 500 MG PO TABS: 1000.0000 mg | ORAL_TABLET | Freq: Four times a day (QID) | ORAL | Status: DC | PRN

## 2015-03-12 MED ORDER — ONDANSETRON HCL 4 MG PO TABS: 4.0000 mg | ORAL_TABLET | Freq: Four times a day (QID) | ORAL | Status: DC | PRN

## 2015-03-12 NOTE — ED Notes (Signed)
Pt c/o pain to right arm ongoing for 2-3 weeks. Pt has been seen by her PMD for same recently. Pt reports that she did fall recently. Pt has also tried a friends prescription pain medication.

## 2015-03-12 NOTE — Discharge Instructions (Signed)
Please take ibuprofen 400mg  (this is normally 2 over the counter pills) every 6 hours (take with food to minimze stomach irritation).   Take robaxin and/or percocet for breakthrough pain, do not drink alcohol, drive, care for children or perfom other critical tasks while taking robaxin and/or percocet.  Please follow with your primary care doctor in the next 2 days for a check-up. They must obtain records for further management.   Do not hesitate to return to the Emergency Department for any new, worsening or concerning symptoms.    Arthralgia Your caregiver has diagnosed you as suffering from an arthralgia. Arthralgia means there is pain in a joint. This can come from many reasons including:  Bruising the joint which causes soreness (inflammation) in the joint.  Wear and tear on the joints which occur as we grow older (osteoarthritis).  Overusing the joint.  Various forms of arthritis.  Infections of the joint. Regardless of the cause of pain in your joint, most of these different pains respond to anti-inflammatory drugs and rest. The exception to this is when a joint is infected, and these cases are treated with antibiotics, if it is a bacterial infection. HOME CARE INSTRUCTIONS   Rest the injured area for as long as directed by your caregiver. Then slowly start using the joint as directed by your caregiver and as the pain allows. Crutches as directed may be useful if the ankles, knees or hips are involved. If the knee was splinted or casted, continue use and care as directed. If an stretchy or elastic wrapping bandage has been applied today, it should be removed and re-applied every 3 to 4 hours. It should not be applied tightly, but firmly enough to keep swelling down. Watch toes and feet for swelling, bluish discoloration, coldness, numbness or excessive pain. If any of these problems (symptoms) occur, remove the ace bandage and re-apply more loosely. If these symptoms persist, contact  your caregiver or return to this location.  For the first 24 hours, keep the injured extremity elevated on pillows while lying down.  Apply ice for 15-20 minutes to the sore joint every couple hours while awake for the first half day. Then 03-04 times per day for the first 48 hours. Put the ice in a plastic bag and place a towel between the bag of ice and your skin.  Wear any splinting, casting, elastic bandage applications, or slings as instructed.  Only take over-the-counter or prescription medicines for pain, discomfort, or fever as directed by your caregiver. Do not use aspirin immediately after the injury unless instructed by your physician. Aspirin can cause increased bleeding and bruising of the tissues.  If you were given crutches, continue to use them as instructed and do not resume weight bearing on the sore joint until instructed. Persistent pain and inability to use the sore joint as directed for more than 2 to 3 days are warning signs indicating that you should see a caregiver for a follow-up visit as soon as possible. Initially, a hairline fracture (break in bone) may not be evident on X-rays. Persistent pain and swelling indicate that further evaluation, non-weight bearing or use of the joint (use of crutches or slings as instructed), or further X-rays are indicated. X-rays may sometimes not show a small fracture until a week or 10 days later. Make a follow-up appointment with your own caregiver or one to whom we have referred you. A radiologist (specialist in reading X-rays) may read your X-rays. Make sure you know how  you are to obtain your X-ray results. Do not assume everything is normal if you do not hear from [place]. SEEK MEDICAL CARE IF: Bruising, swelling, or pain increases. SEEK IMMEDIATE MEDICAL CARE IF:   Your fingers or toes are numb or blue.  The pain is not responding to medications and continues to stay the same or get worse.  The pain in your joint becomes  severe.  You develop a fever over 102 F (38.9 C).  It becomes impossible to move or use the joint. MAKE SURE YOU:   Understand these instructions.  Will watch your condition.  Will get help right away if you are not doing well or get worse. Document Released: 12/16/2005 Document Revised: 03/09/2012 Document Reviewed: 08/03/2008 [facility] Patient Information 2015 [place], [place]. This information is not intended to replace advice given to you by your health care provider. Make sure you discuss any questions you have with your health care provider.

## 2015-03-12 NOTE — ED Provider Notes (Signed)
CSN: [id number]     Arrival date & time 03/12/15  1126 History  This chart was scribed for a non-physician practitioner, Monico Blitz, PA-C working with Davonna Belling, MD by Martinique Peace, ED Scribe. The patient was seen in TR10C/TR10C. The patient's care was started at 1:42 PM.    Chief Complaint  Patient presents with  . Arm Pain      Patient is a 54 y.o. female presenting with arm pain. The history is provided by the patient. No language interpreter was used.  Arm Pain    HPI Comments: Mariah Jackson is a 54 y.o. female who presents to the Emergency Department complaining of right shoulder/arm pain x 2-3 weeks. Pt reports that she fell 3-4 weeks ago but denies any pain after initial incident. Pt also complains of right shoulder pain that has gotten progressively worse. Pt explains that she looses her balance quiet often secondary to MS. She is not falling more than normal.. She states she uses a cane to assist with walking and adds that she also has a walker at home that she sometimes uses as well. History of Multiple Sclerosis, Breast Cancer, and Hypertension. Patient rates her pain at 8 out of 10, is exacerbated by palpation and certain positions. She denies reduced range of motion, fever, chills, chest pain, shortness of breath, palpitations, she is tachycardic, numbness, weakness, paresthesia.    Past Medical History  Diagnosis Date  . Multiple sclerosis   . Hypothyroidism   . Hypertension   . Hyperlipidemia   . Breast cancer 2007    left   Past Surgical History  Procedure Laterality Date  . Tubal ligation  1985  . Breast lumpectomy  2007    left   Family History  Problem Relation Age of Onset  . Colon cancer Neg Hx   . Heart attack Father    History  Substance Use Topics  . Smoking status: Never Smoker   . Smokeless tobacco: Never Used  . Alcohol Use: No   OB History    No data available     Review of Systems  Musculoskeletal:       Right arm pain. Right  shoulder pain.    A complete 10 system review of systems was obtained and all systems are negative except as noted in the HPI and PMH.     Allergies  Vicodin  Home Medications   Prior to Admission medications   Medication Sig Start Date End Date Taking? Authorizing Provider  amLODipine (NORVASC) 5 MG tablet Take 1 tablet (5 mg total) by mouth daily. 11/30/14   Aquilla Hacker, MD  celecoxib (CELEBREX) 100 MG capsule Take 1 capsule (100 mg total) by mouth 2 (two) times daily. 02/09/15   Marcial Pacas, MD  dalfampridine (AMPYRA) 10 MG TB12 Take 1 tablet (10 mg total) by mouth every 12 (twelve) hours. 02/07/14   Marcial Pacas, MD  Dimethyl Fumarate (TECFIDERA) 240 MG CPDR Take 1 capsule (240 mg total) by mouth 2 (two) times daily. 12/06/13   Marcial Pacas, MD  levothyroxine (SYNTHROID, LEVOTHROID) 100 MCG tablet Take 1 tablet (100 mcg total) by mouth daily before breakfast. 11/30/14   Aquilla Hacker, MD  Multiple Vitamin (MULTIVITAMIN) tablet Take 1 tablet by mouth daily.    Historical Provider, MD  rosuvastatin (CRESTOR) 10 MG tablet Take 1 tablet (10 mg total) by mouth daily. 02/20/15   York Ram Melancon, MD   BP 166/91 mmHg  Pulse 109  Temp(Src) 98.3 F (36.8 C) (  Oral)  Resp 18  Ht 5\' 4"  (1.626 m)  Wt 168 lb (76.204 kg)  BMI 28.82 kg/m2  SpO2 95% Physical Exam  Constitutional: She is oriented to person, place, and time. She appears well-developed and well-nourished. No distress.  HENT:  Head: Normocephalic and atraumatic.  Mouth/Throat: Oropharynx is clear and moist.  Eyes: Conjunctivae and EOM are normal. Pupils are equal, round, and reactive to light.  Neck: Normal range of motion. Neck supple. No tracheal deviation present.  Cardiovascular: Normal rate, regular rhythm and intact distal pulses.   Pulmonary/Chest: Effort normal and breath sounds normal. No respiratory distress. She has no wheezes. She has no rales. She exhibits no tenderness.  Abdominal: Soft. Bowel sounds are normal. She  exhibits no distension and no mass. There is no tenderness. There is no rebound and no guarding.  Musculoskeletal: Normal range of motion. She exhibits tenderness. She exhibits no edema.       Arms: Right shoulder:  Shoulder with no deformity. FROM to shoulder and elbow. + point TTP on rotator cuff musculature. Drop arm negative. Neurovascularly intact   Neurological: She is alert and oriented to person, place, and time.  Skin: Skin is warm and dry.  Psychiatric: She has a normal mood and affect. Her behavior is normal.  Nursing note and vitals reviewed.   ED Course  Procedures (including critical care time) Labs Review Labs Reviewed - No data to display  Imaging Review Dg Shoulder Right  03/12/2015   CLINICAL DATA:  [place] 3 weeks ago. Pain mostly in the shoulder radiating down the arm.  EXAM: RIGHT SHOULDER - 2+ VIEW  COMPARISON:  None.  FINDINGS: Slight irregularity of the distal acromion on the frontal view only, which is likely projectional but correlation with point tenderness is recommended to exclude fracture. There is no other evidence of fracture or dislocation. There is no evidence of arthropathy or other focal bone abnormality. Soft tissues are unremarkable.  IMPRESSION: 1. Slight irregularity of the distal acromion on the frontal view only, which is likely projectional but correlation with point tenderness is recommended to exclude fracture. 2. Otherwise there is no acute fracture or dislocation.   Electronically Signed   By: Kathreen Devoid   On: 03/12/2015 13:28   Dg Humerus Right  03/12/2015   CLINICAL DATA:  Patient fell approximately 3 weeks ago and injured the right shoulder and right upper arm. Progressively worsening pain since that time.  EXAM: RIGHT HUMERUS - 2+ VIEW  COMPARISON:  None. Right shoulder x-rays obtained concurrently are correlated.  FINDINGS: No acute fracture involving the humerus. No intrinsic osseous abnormality. Visualized elbow joint intact.  IMPRESSION:  Normal examination.   Electronically Signed   By: Evangeline Dakin M.D.   On: 03/12/2015 13:27     EKG Interpretation None     Medications - No data to display   1:47 PM- Treatment plan was discussed with patient who verbalizes understanding and agrees.   MDM   Final diagnoses:  Right shoulder pain    Filed Vitals:   03/12/15 1144  BP: 166/91  Pulse: 109  Temp: 98.3 F (36.8 C)  TempSrc: Oral  Resp: 18  Height: 5\' 4"  (1.626 m)  Weight: 168 lb (76.204 kg)  SpO2: 95%    Medications  morphine 4 MG/ML injection 4 mg (4 mg Intramuscular Given 03/12/15 1355)  ondansetron (ZOFRAN-ODT) disintegrating tablet 4 mg (4 mg Oral Given 03/12/15 1354)    Mariah Jackson is a pleasant 54 y.o. female presenting  with right shoulder pain specifically at the before meals joint. She has bruising the bicep. Patient states that she falls frequently secondary to her multiple sclerosis. She states that this is not increasing from her baseline. She has good range of motion to the shoulder. X-ray shows an abnormality at the area of her tenderness. We discussed the pros and cons of obtaining a CT here today, and shared decision-making patient has opted to obtain CT today, which shows no focal abnormalities. I have given her an orthopedist referral. She will also follow with her neurologist.   I personally reviewed the imaging tests through PACS system  I reviewed available ER/hospitalization records through the EMR  Evaluation does not show pathology that would require ongoing emergent intervention or inpatient treatment. Pt is hemodynamically stable and mentating appropriately. Discussed findings and plan with patient/guardian, who agrees with care plan. All questions answered. Return precautions discussed and outpatient follow up given.   New Prescriptions   METHOCARBAMOL (ROBAXIN) 500 MG TABLET    Take 2 tablets (1,000 mg total) by mouth 4 (four) times daily as needed (Pain).   ONDANSETRON (ZOFRAN) 4  MG TABLET    Take 1 tablet (4 mg total) by mouth every 6 (six) hours as needed for nausea or vomiting.   OXYCODONE-ACETAMINOPHEN (PERCOCET/ROXICET) 5-325 MG PER TABLET    1 to 2 tabs PO q6hrs  PRN for pain     I personally performed the services described in this documentation, which was scribed in my presence. The recorded information has been reviewed and is accurate.    Monico Blitz, PA-C 03/13/15 Start, MD 03/15/15 [phone]

## 2015-03-12 NOTE — ED Notes (Signed)
Declined W/C at D/C and was escorted to lobby by RN. 

## 2015-03-14 ENCOUNTER — Telehealth: Payer: Self-pay | Admitting: *Deleted

## 2015-03-14 NOTE — Telephone Encounter (Signed)
Debbie from [facility] called needing a [facility] referral placed.  Pt has an appt 03/16/2015 for MS follow up.  ICD-10 code [place]. Pt will be seeing Dr. Krista Blue NPI: [id number].  Please give Jackelyn Poling a call at [phone].  Derl Barrow, RN

## 2015-03-14 NOTE — Telephone Encounter (Signed)
Referral authorizatin received for 6 visits until 09/14/2015.  Authorization#  [id number].  LM for Debbie to call back to get this information. Jesselle Laflamme,CMA

## 2015-03-16 ENCOUNTER — Encounter: Payer: Self-pay | Admitting: Neurology

## 2015-03-16 ENCOUNTER — Ambulatory Visit (INDEPENDENT_AMBULATORY_CARE_PROVIDER_SITE_OTHER): Payer: Commercial Managed Care - HMO | Admitting: Neurology

## 2015-03-16 VITALS — BP 158/89 | HR 94 | Ht 64.0 in | Wt 187.0 lb

## 2015-03-16 DIAGNOSIS — G35 Multiple sclerosis: Secondary | ICD-10-CM | POA: Diagnosis not present

## 2015-03-16 DIAGNOSIS — R269 Unspecified abnormalities of gait and mobility: Secondary | ICD-10-CM

## 2015-03-16 MED ORDER — OXYCODONE-ACETAMINOPHEN 5-325 MG PO TABS: 1.0000 | ORAL_TABLET | Freq: Three times a day (TID) | ORAL | Status: DC | PRN

## 2015-03-16 NOTE — Progress Notes (Signed)
[facility]  PATIENT: Mariah Jackson DOB: 03-29-1961  HISTORY OF PRESENT ILLNESS:[name], 54 year old female returns for followup for RRMS  She was diagnosed with multiple sclerosis in 2001, by Dr. Dellis Filbert at [facility]. The disease has primarily affected her balance and gait. She could only clarify one exacerbations, presenting as worsening balance difficulty, and says she has never been treated with intravenous steroids.   She was placed on Betaseron shortly after diagnosis, and has been on that drug ever since.  Her gait difficulty had little changes over past 11 years. She ambulates with a cane, but is able to get around the house pretty well without it.    Most recent MRI was in January 2011,shows multiple periventricular, periatrial and subcortical white matter hyperintensities suggestive of demyelinating disease.No enhancing lesions or significant atrophy is seen. compared to MRI films from Aug 2000 there appear to be more T2 lesions in right frontal subcortical region.   She is tired of taking the shots and does have multiple areas of lipoatrophy, especially on the abdominal area. She is ambulating with a cane. She is wishing to switch to an oral preparation, was switched to [facility] Oct 2014, doing well.  UPDATE Sep 18th 2015: She denies signficant side effect from Tecfidera.  She has been on Ampyra 10mg  bid for few years, which definitely has helped her walking  MRI brain March 2014: Multiple periventricular and subcortical chronic demyelinating disease. No acute plaques are seen. MRI cervical spine: Disc bulging at C5-6. Spondylosis at C5-6 and C6-7. C5-6 there is focal disc protrusion slightly deforms anterior spinal cord, with spinal stenosis or foraminal stenosis.  No intrinsic or enhancing spinal cord lesions.  UPDATE March 17th 2016: She fell in Feb 2016, now with right shoulder and neck pain, overall has improved, she is continue taking Tecfidera, Ampyra,  denies significant side effect, ambulate with spastic gait, wear her right ankle brace.   REVIEW OF SYSTEMS: Full 14 system review of systems performed and notable only for those listed, all others are neg: Fatigue, leg swelling, nausea, joints pain, dizziness, headaches, numbness, neck pain    ALLERGIES: Allergies  Allergen Reactions  . Vicodin [Hydrocodone-Acetaminophen] Nausea Only    HOME MEDICATIONS: Outpatient Prescriptions Prior to Visit  Medication Sig Dispense Refill  . amLODipine (NORVASC) 5 MG tablet Take 1 tablet (5 mg total) by mouth daily. 60 tablet 5  . celecoxib (CELEBREX) 100 MG capsule Take 1 capsule (100 mg total) by mouth 2 (two) times daily. 60 capsule 3  . dalfampridine (AMPYRA) 10 MG TB12 Take 1 tablet (10 mg total) by mouth every 12 (twelve) hours. 180 tablet 3  . Dimethyl Fumarate (TECFIDERA) 240 MG CPDR Take 1 capsule (240 mg total) by mouth 2 (two) times daily. 60 capsule 6  . levothyroxine (SYNTHROID, LEVOTHROID) 100 MCG tablet Take 1 tablet (100 mcg total) by mouth daily before breakfast. 30 tablet 5  . methocarbamol (ROBAXIN) 500 MG tablet Take 2 tablets (1,000 mg total) by mouth 4 (four) times daily as needed (Pain). 20 tablet 0  . Multiple Vitamin (MULTIVITAMIN) tablet Take 1 tablet by mouth daily.    . ondansetron (ZOFRAN) 4 MG tablet Take 1 tablet (4 mg total) by mouth every 6 (six) hours as needed for nausea or vomiting. 4 tablet 0  . oxyCODONE-acetaminophen (PERCOCET/ROXICET) 5-325 MG per tablet 1 to 2 tabs PO q6hrs  PRN for pain 15 tablet 0  . rosuvastatin (CRESTOR) 10 MG tablet Take 1 tablet (10 mg total) by  mouth daily. 90 tablet 3   No facility-administered medications prior to visit.    PAST MEDICAL HISTORY: Past Medical History  Diagnosis Date  . Multiple sclerosis   . Hypothyroidism   . Hypertension   . Hyperlipidemia   . Breast cancer 2007    left    PAST SURGICAL HISTORY: Past Surgical History  Procedure Laterality Date  . Tubal  ligation  1985  . Breast lumpectomy  2007    left    FAMILY HISTORY: Family History  Problem Relation Age of Onset  . Colon cancer Neg Hx   . Heart attack Father     SOCIAL HISTORY: History   Social History  . Marital Status: Divorced    Spouse Name: N/A  . Number of Children: 2  . Years of Education: 12   Occupational History  . Unemployed    . Disabiliyt    Social History Main Topics  . Smoking status: Never Smoker   . Smokeless tobacco: Never Used  . Alcohol Use: No  . Drug Use: No  . Sexual Activity: No   Other Topics Concern  . Not on file   Social History Narrative   Patient lives at home with her daughter and son.    Patient has 2 children.    Patient is divorced.    Patient a high school education.    Patient is unemployed.      PHYSICAL EXAM  There were no vitals filed for this visit. There is no weight on file to calculate BMI.  PHYSICAL EXAMNIATION:  Gen: NAD, conversant, well nourised, obese, well groomed                     Cardiovascular: Regular rate rhythm, no peripheral edema, warm, nontender. Eyes: Conjunctivae clear without exudates or hemorrhage Neck: Supple, no carotid bruise. Pulmonary: Clear to auscultation bilaterally   NEUROLOGICAL EXAM:  MENTAL STATUS: Speech:    Speech is normal; fluent and spontaneous with normal comprehension.  Cognition:    The patient is oriented to person, place, and time;     recent and remote memory intact;     language fluent;     normal attention, concentration,     fund of knowledge.  CRANIAL NERVES: CN II: Visual fields are full to confrontation. Fundoscopic exam is normal with sharp discs and no vascular changes. Venous pulsations are present bilaterally. Pupils are 4 mm and briskly reactive to light. Visual acuity is 20/20 bilaterally. CN III, IV, VI: extraocular movement are normal. No ptosis. CN V: Facial sensation is intact to pinprick in all 3 divisions bilaterally. Corneal responses  are intact.  CN VII: Face is symmetric with normal eye closure and smile. CN VIII: Hearing is normal to rubbing fingers CN IX, X: Palate elevates symmetrically. Phonation is normal. CN XI: Head turning and shoulder shrug are intact CN XII: Tongue is midline with normal movements and no atrophy.  MOTOR: She wore right ankle brace, mild bilateral lower extremity spasticity, mild bilateral hip flexion, ankle dorsiflexion weakness.   REFLEXES: Reflexes are 2+ and symmetric at the biceps, triceps, knees, and ankles. Plantar responses are flexor.  SENSORY: Light touch, pinprick, position sense, and vibration sense are intact in fingers and toes.  COORDINATION: Rapid alternating movements and fine finger movements are intact. There is no dysmetria on finger-to-nose and heel-knee-shin. There are no abnormal or extraneous movements.   GAIT/STANCE: Wide-based, cautious, mild stiff, unsteady gait  DIAGNOSTIC DATA (LABS, IMAGING, TESTING) -  I reviewed patient records, labs, notes, testing and imaging myself where available.  Lab Results  Component Value Date   WBC 4.0 09/16/2014   HGB 13.8 09/16/2014   HCT 39.1 09/16/2014   MCV 76* 09/16/2014   PLT 227 09/16/2014      Component Value Date/Time   NA 139 09/16/2014 1336   K 3.7 09/16/2014 1336   CL 103 09/16/2014 1336   CO2 27 09/16/2014 1336   GLUCOSE 100* 09/16/2014 1336   BUN 10 09/16/2014 1336   CREATININE 0.94 09/16/2014 1336   CALCIUM 10.3* 09/16/2014 1336   PROT 7.0 09/16/2014 1336   AST 19 09/16/2014 1336   ALT 16 09/16/2014 1336   ALKPHOS 149* 09/16/2014 1336   BILITOT 0.7 09/16/2014 1336   GFRNONAA 69 09/16/2014 1336   GFRAA 79 09/16/2014 1336    ASSESSMENT AND PLAN  54 y.o. year old female  has a past medical history of Multiple sclerosis, last MRI of the brain, and cervical spine with and without contrast was March 2014; Hypothyroidism; Hypertension; Hyperlipidemia; and Breast cancer   Continue tecfidera twice a  day, Ampyra 10 mg twice a day Check labs today CBC CMP Followup in 6 months   Marcial Pacas, M.D.  [facility] [address], [place], [place] [address] [phone]

## 2015-03-17 ENCOUNTER — Other Ambulatory Visit: Payer: Self-pay | Admitting: *Deleted

## 2015-03-23 ENCOUNTER — Other Ambulatory Visit: Payer: Self-pay | Admitting: Neurology

## 2015-03-23 NOTE — Telephone Encounter (Signed)
Still having right shoulder pain - - neck is better - says oxycodone is making her nauseated and dizzy - she has tried it with food - feels it is not helpful.  Feels ibuprofen has been more helpful than anything.  She is requesting either a topical medication or a prescription for a stronger ibuprofen, if you feel it would be appropriate.

## 2015-03-23 NOTE — Telephone Encounter (Signed)
Patient stated Rx oxyCODONE-acetaminophen (PERCOCET) 5-325 MG per tablet and also making her nauseated.  Please call and advise.

## 2015-03-24 MED ORDER — DICLOFENAC SODIUM 1 % TD GEL: 4.0000 g | Freq: Four times a day (QID) | TRANSDERMAL | Status: DC

## 2015-03-24 NOTE — Telephone Encounter (Signed)
Faith: I have called in Valteran gel for her right shoulder pain, if she continues to have pain, she should see orthopedic surgeon, better referred by her primary care physician, if needed, our office can do it for her to

## 2015-03-24 NOTE — Telephone Encounter (Signed)
Spoke with [facility] and per Dr. Krista Blue, advised that Voltaren gel was sent to [facility]; that she may apply to shoulder qid; that if she continue to have pain, she should see pcp for referral to ortho.  Mariah Jackson verbalized understanding of same/fim

## 2015-03-31 ENCOUNTER — Telehealth: Payer: Self-pay | Admitting: Neurology

## 2015-03-31 NOTE — Telephone Encounter (Signed)
Voltaren gel orders were not clear to pharmacy.      I called [facility] and straightened out:    4 g to right shoulder bid prn  #300 g # 1 refill

## 2015-04-07 ENCOUNTER — Ambulatory Visit (INDEPENDENT_AMBULATORY_CARE_PROVIDER_SITE_OTHER): Payer: Commercial Managed Care - HMO | Admitting: Family Medicine

## 2015-04-07 VITALS — BP 138/97 | HR 92 | Temp 98.2°F | Ht 64.0 in | Wt 185.0 lb

## 2015-04-07 DIAGNOSIS — I10 Essential (primary) hypertension: Secondary | ICD-10-CM

## 2015-04-07 DIAGNOSIS — M25511 Pain in right shoulder: Secondary | ICD-10-CM

## 2015-04-07 MED ORDER — IBUPROFEN 600 MG PO TABS: 600.0000 mg | ORAL_TABLET | Freq: Three times a day (TID) | ORAL | Status: DC | PRN

## 2015-04-07 MED ORDER — AMLODIPINE BESYLATE 10 MG PO TABS: 10.0000 mg | ORAL_TABLET | Freq: Every day | ORAL | Status: DC

## 2015-04-07 NOTE — Patient Instructions (Signed)
Thanks for coming in today!  Hypertension: For your blood pressure we will be going up to 10mg  of Norvasc daily. You can take two of your 5 mg pills each day.   Shoulder Pain:  - Ice pack 30 minutes morning and evening.  - Use the prescription strength ibuprofen.  - You will contacted about your appointment with physical therapy.  - Return in 2 weeks for follow up.   Thanks for letting us take care of you!  Sincerely,  Paula Compton, MD Family Medicine - PGY 1

## 2015-04-10 NOTE — Progress Notes (Signed)
Patient ID: Mariah Jackson, female   DOB: 04/13/1961, 54 y.o.   MRN: [id number]   [facility] Aquilla Hacker, MD Phone: [phone]  Subjective:   # HTN - Pt. Here for follow up of her hypertension. BP 138/97  With repeat of 135/92. Goal is 140/90.  - No chest pain, palpitations, SOB, vision changes, lower extremity swelling.  - Previously on Norvasc 5mg  which she tolerated well.   # Right Shoulder Pain - Pt. Has had right shoulder pain that started whenever she hit her shoulder on a grill while standing up in her house.  - She has had ongoing pain that is predominantly over the deltoid region, she has some shooting pain from her neck down to her shoulder, but no shooting pain down her arm.  - She went to the ED for her shoulder where they took x-rays and got a CT scan both of which showed no acute bony abnormalities, but some degenerative changes of the A/C joint.  - She was given narcotics which she says she took once but did not take again because they made her sick.  - Ibuprofen has been helping as well as voltaren gel.  - She has not done any physical therapy.  - She has not tried Ice / [employer].  - She has no loss of function, but does have some loss of range of motion.  - She says that trying to abduct it makes the pain worse, and she has difficulty with strength to perform abduction.  - No previous injuries, and no history of neck injuries.  - Has never played any sports.   All relevant systems were reviewed and were negative unless otherwise noted in the HPI  Past Medical History Reviewed problem list.  Medications- reviewed and updated Current Outpatient Prescriptions  Medication Sig Dispense Refill  . amLODipine (NORVASC) 10 MG tablet Take 1 tablet (10 mg total) by mouth daily. 90 tablet 4  . celecoxib (CELEBREX) 100 MG capsule Take 1 capsule (100 mg total) by mouth 2 (two) times daily. 60 capsule 3  . dalfampridine (AMPYRA) 10 MG TB12 Take 1 tablet (10  mg total) by mouth every 12 (twelve) hours. 180 tablet 3  . diclofenac sodium (VOLTAREN) 1 % GEL Apply 4 g topically 4 (four) times daily. 100 g 6  . Dimethyl Fumarate (TECFIDERA) 240 MG CPDR Take 1 capsule (240 mg total) by mouth 2 (two) times daily. 60 capsule 6  . ibuprofen (ADVIL,MOTRIN) 600 MG tablet Take 1 tablet (600 mg total) by mouth every 8 (eight) hours as needed. 30 tablet 0  . levothyroxine (SYNTHROID, LEVOTHROID) 100 MCG tablet Take 1 tablet (100 mcg total) by mouth daily before breakfast. 30 tablet 5  . methocarbamol (ROBAXIN) 500 MG tablet Take 2 tablets (1,000 mg total) by mouth 4 (four) times daily as needed (Pain). 20 tablet 0  . Multiple Vitamin (MULTIVITAMIN) tablet Take 1 tablet by mouth daily.    . ondansetron (ZOFRAN) 4 MG tablet Take 1 tablet (4 mg total) by mouth every 6 (six) hours as needed for nausea or vomiting. 4 tablet 0  . oxyCODONE-acetaminophen (PERCOCET) 5-325 MG per tablet Take 1 tablet by mouth every 8 (eight) hours as needed for severe pain. 40 tablet 0  . rosuvastatin (CRESTOR) 10 MG tablet Take 1 tablet (10 mg total) by mouth daily. 90 tablet 3   No current facility-administered medications for this visit.   Chief complaint-noted No additions to family history Social history- patient  is a non smoker  Objective: BP 138/97 mmHg  Pulse 92  Temp(Src) 98.2 F (36.8 C) (Oral)  Ht 5\' 4"  (1.626 m)  Wt 185 lb (83.915 kg)  BMI 31.74 kg/m2 Gen: NAD, alert, cooperative with exam HEENT: NCAT, EOMI, PERRL, TMs nml Neck: FROM, supple CV: RRR, good S1/S2, no murmur, cap refill <3 Resp: CTABL, no wheezes, non-labored Abd: SNTND, BS present, no guarding or organomegaly Ext: No edema, warm, normal tone, moves UE/LE spontaneously RUE: Pt. With TTP over right deltoid. No joint line tenderness, no bruising, no gross deformity. Some mild A/C tenderness. Mild Weakness with abduction and impaired ROM with abduction. Negative NEER's, Negative Hawkins, limited ability to  place hand behind her back. Full passive ROM.  Neuro: Alert and oriented, No gross deficits Skin: no rashes no lesions  Assessment/Plan:  # HTN  - diastolic not at goal. Repeat with diastolic still > 90. Pt. Tolerating norvasc well. No other symptoms of worsening heart disease at this time.  - Continue ambulatory bp monitoring.  - Increase norvasc to 10mg  daily.  - Side effects and discontinuation precautions discussed.  - Will follow up at next office visit.   # Right Shoulder Pain - Difficult to say based on physical exam and imaging exactly what is causing her pain. Likely related to some level of degenerative change combined with acute inflammation from traumatic injury several weeks ago. Some limited active abduction. Could possibly be supraspinatus damage.  - Referral to physical therapy.  - Motrin for pain and inflammation.  - Icing and rest discussed.  - Home exercises given.  - Patient to return in 2 weeks for further evaluation. May consider MRI at that time if little improvement, and continues with impaired ROM / weakness.

## 2015-04-11 ENCOUNTER — Encounter: Payer: Self-pay | Admitting: Family Medicine

## 2015-04-14 ENCOUNTER — Ambulatory Visit
Admission: RE | Admit: 2015-04-14 | Discharge: 2015-04-14 | Disposition: A | Discharge status: Home / Self Care | Payer: Commercial Managed Care - HMO | Source: Ambulatory Visit | Attending: Surgery | Admitting: Surgery

## 2015-04-14 DIAGNOSIS — E042 Nontoxic multinodular goiter: Secondary | ICD-10-CM | POA: Diagnosis not present

## 2015-04-15 ENCOUNTER — Other Ambulatory Visit: Payer: Self-pay | Admitting: Family Medicine

## 2015-04-21 ENCOUNTER — Ambulatory Visit: Payer: Medicare HMO | Admitting: Family Medicine

## 2015-04-24 ENCOUNTER — Ambulatory Visit: Payer: Commercial Managed Care - HMO | Attending: Family Medicine | Admitting: Physical Therapy

## 2015-04-24 DIAGNOSIS — M25619 Stiffness of unspecified shoulder, not elsewhere classified: Secondary | ICD-10-CM

## 2015-04-24 DIAGNOSIS — M25611 Stiffness of right shoulder, not elsewhere classified: Secondary | ICD-10-CM | POA: Insufficient documentation

## 2015-04-24 DIAGNOSIS — M7580 Other shoulder lesions, unspecified shoulder: Secondary | ICD-10-CM | POA: Insufficient documentation

## 2015-04-24 DIAGNOSIS — M25511 Pain in right shoulder: Secondary | ICD-10-CM | POA: Diagnosis not present

## 2015-04-24 DIAGNOSIS — R29898 Other symptoms and signs involving the musculoskeletal system: Secondary | ICD-10-CM

## 2015-04-24 NOTE — Therapy (Addendum)
[place], [place], [address] Phone: [phone]   Fax:  [phone]  Physical Therapy Evaluation  Patient Details  Name: Mariah Jackson MRN: [id number] Date of Birth: [birth year] Referring Provider:  Aquilla Hacker, MD  Encounter Date: 04/24/2015      PT End of Session - 04/24/15 1316    Visit Number 1   Number of Visits 16   Date for PT Re-Evaluation 06/24/15   PT Start Time 3295   PT Stop Time 1230   PT Time Calculation (min) 45 min   Activity Tolerance Patient tolerated treatment well;Patient limited by pain   Behavior During Therapy [facility] for tasks assessed/performed      Past Medical History  Diagnosis Date  . Multiple sclerosis   . Hypothyroidism   . Hypertension   . Hyperlipidemia   . Breast cancer 2007    left    Past Surgical History  Procedure Laterality Date  . Tubal ligation  1985  . Breast lumpectomy  2007    left    There were no vitals filed for this visit.  Visit Diagnosis:  Right shoulder pain - Plan: PT plan of care cert/re-cert  Shoulder stiffness, right - Plan: PT plan of care cert/re-cert  Decreased range of motion (ROM) of shoulder - Plan: PT plan of care cert/re-cert  Weakness of shoulder - Plan: PT plan of care cert/re-cert      Subjective Assessment - 04/24/15 1153    Subjective pt is a 54 y.o F with CC of R shoulder pain that started about a month ago. Mariah Jackson reported that Mariah Jackson hit it on her grill. Mariah Jackson was lifting somthing up and hit her shoulder. Mariah Jackson reports it has gotten better since Mariah Jackson took the volatren and ibuprofen.    Limitations Lifting   How long can you sit comfortably? 30  min   How long can you stand comfortably? 30 min   How long can you walk comfortably? 15 min   Diagnostic tests CT scan and X-ray per pt report atrhtritis in the joint   Patient Stated Goals to be pain free to get her arm above her head, be able to do personal hygiene.    Currently in Pain? Yes   Pain Score 0-No pain  took ibuprofen this morning   Pain Location Shoulder   Pain Orientation Right;Lateral   Pain Descriptors / Indicators Tingling;Dull   Pain Type Acute pain   Pain Radiating Towards was up to the neck but has stopped in a little bit.    Pain Onset More than a month ago   Pain Frequency Intermittent   Aggravating Factors  not moving it, lifting weight,    Pain Relieving Factors voltaren, and ibuprofen, icing   Effect of Pain on Daily Activities limited AROM/strength of the R shoulder            [facility] PT Assessment - 04/24/15 1205    Assessment   Medical Diagnosis Right shoulder pain   Onset Date --  about a month ago   Next MD Visit --  make on as needed   Prior Therapy yes   Precautions   Precautions None   Restrictions   Weight Bearing Restrictions No   Balance Screen   Has the patient fallen in the past 6 months No   [place] Private residence   Living Arrangements Children   Available Help at Discharge Available 24 hours/day   Type  of [place] to enter;Ramped entrance   Entrance Stairs-Number of Steps 5   Entrance Stairs-Rails Can reach both   Home Layout One level   [place] - 2 wheels;Wheelchair - manual;Cane - single point   Prior Function   Level of Independence Independent with basic ADLs;Independent with homemaking with ambulation;Independent with gait;Independent with transfers   Vocation On disability   Leisure watch Tv, playing with grand children, traveling to the beach   Cognition   Overall Cognitive Status Within Functional Limits for tasks assessed   Observation/Other Assessments   Focus on Therapeutic Outcomes (FOTO)  39%  predicted 29%    Posture/Postural Control   Posture/Postural Control Postural limitations   Postural Limitations Rounded Shoulders;Forward head   ROM / Strength   AROM / PROM / Strength AROM;Strength;PROM   AROM    Overall AROM Comments left shoulder is WNL   AROM Assessment Site Shoulder   Right/Left Shoulder Right;Left   Right Shoulder Flexion 96 Degrees  significant shoulder hiking   Right Shoulder ABduction 54 Degrees  shoulder hiking   Right Shoulder External Rotation 88 Degrees   PROM   PROM Assessment Site Shoulder   Right/Left Shoulder Right   Right Shoulder Flexion 170 Degrees   Right Shoulder ABduction 145 Degrees   Strength   Overall Strength Comments --  L shoulder is [facility]   Strength Assessment Site Shoulder   Right/Left Shoulder Right;Left   Right Shoulder Flexion 2/5  pain during testing   Right Shoulder Extension 4+/5   Right Shoulder ABduction 2/5  pain during testing   Right Shoulder Internal Rotation 4+/5   Right Shoulder External Rotation 2/5   Left Shoulder Flexion 5/5   Left Shoulder Extension 5/5   Left Shoulder ABduction 5/5   Left Shoulder Internal Rotation 5/5   Left Shoulder External Rotation 5/5   Palpation   Palpation tenderness notead at the belly of the r supraspinatus   Special Tests    Special Tests Rotator Cuff Impingement   Rotator Cuff Impingment tests Michel Bickers test   Hawkins-Kennedy test   Findings Negative   Side Right   Empty Can test   Findings Positive   Side Right   Full Can test   Findings Positive   Side Right   Drop Arm test   Findings Positive   Side Right   Painful Arc of Motion   Findings Positive   Side Right                           PT Education - 04/24/15 1315    Education provided Yes   Education Details evaluation findings, POC, goals, HEP   Person(s) Educated Patient   Methods Explanation   Comprehension Verbalized understanding;Returned demonstration          PT Short Term Goals - 04/24/15 1325    PT SHORT TERM GOAL #1   Title pt will be I with Basic HEP 05/24/2015   Time 4   Period Weeks   Status New   PT SHORT TERM GOAL #2   Title pt will increase R shoulder flexion and  abduction AROM by >10 degrees with minimal to no shoulder hiking to assist with functional progression 05/24/2015   Time 4   Period Weeks   Status New   PT SHORT TERM GOAL #3   Title pt will Increase R shoulder flexion, abduction and ER  strength to >  3+/5 to help with ADLs 05/24/2015   Time 4   Period Weeks   Status New   PT SHORT TERM GOAL #4   Title Mariah Jackson will be able to verbalize and demonstrate techniques to control swelling and inflammation via RICE method 05/24/2015   Time 4   Period Weeks   Status New           PT Long Term Goals - 2015/05/08 1328    PT LONG TERM GOAL #1   Title pt will I with Advanced HEP 06/24/2015   Time 8   Period Weeks   Status New   PT LONG TERM GOAL #2   Title pt will increase R shoulder AROM in all planes to [facility] compared bil to assist with personal hygiene and ADLs 06/24/2015   Time 8   Period Weeks   Status New   PT LONG TERM GOAL #3   Title pt will increase R shoulder Strength to >4/5 to help with ADls 7/56/4332   Time 8   Period Weeks   Status New   PT LONG TERM GOAL #4   Title pt will be able to verbalize and demonstrate techniques to reduce risk or R shoulder reinjury via postural awareness, lifting mechanics, and HEP 06/24/2015   Time 8   Period Weeks   Status New   PT LONG TERM GOAL #5   Title pt will increase FOTO score to >71 to help with functional capacity 06/24/2015   Time 8   Period Weeks   Status New               Plan - 08-May-2015 1316    Clinical Impression Statement Mariah Jackson presents to OPPT with CC of R shoulder pain following hitting it on her grill when lifting and item, Mariah Jackson has a hx of relapse remitting MS and current ambulates using a cane on her L side.  Mariah Jackson demonstrates limited AROM with flexion and abduction  demonstrates significant shoulder hiking to compensate PROM Mariah Jackson is able to get full ROM.  Mariah Jackson has weakness rated at 2/5 for shoulder flexion, abduction and external rotation.  Mariah Jackson is tender at the muscle belly of  the R supraspinatous with  referral of symptoms tot he mid lateral humerus. special testing was positive with hawkens kennedy, open can/ empty can, neers impingement and painful arc indicating a impingement pathology.  Mariah Jackson has a forward head posture with ant rolled shoulders bil. Mariah Jackson would benefit from skilled physical therapy to maximize her function and decrease pain by addressin the impairments listed.    Pt will benefit from skilled therapeutic intervention in order to improve on the following deficits Decreased range of motion;Impaired flexibility;Decreased strength;Pain;Postural dysfunction;Decreased endurance;Impaired UE functional use;Decreased activity tolerance;Increased muscle spasms;Improper body mechanics   Rehab Potential Good   PT Frequency 2x / week   PT Duration 8 weeks   PT Treatment/Interventions ADLs/Self Care Home Management;Moist Heat;Therapeutic activities;Patient/family education;Passive range of motion;Therapeutic exercise;Ultrasound;Balance training;Manual techniques;Dry needling;Cryotherapy;Neuromuscular re-education;Electrical Stimulation   PT Next Visit Plan assess response to HEP, R shoulder AAROM, and strengthening, modalities for pain PRN, scapular stabilizers   PT Home Exercise Plan wand flexion/abduction, shoulder IR/ER with red theraband   Consulted and Agree with Plan of Care Patient          G-Codes - 08-May-2015 1440    Functional Assessment Tool Used FOTO 38%   Functional Limitation Carrying, moving and handling objects   Carrying, Moving and Handling Objects Current Status (R5188) At least 20 percent  but less than 40 percent impaired, limited or restricted   Carrying, Moving and Handling Objects Goal Status (J0122) At least 1 percent but less than 20 percent impaired, limited or restricted       Problem List Patient Active Problem List   Diagnosis Date Noted  . Essential hypertension 11/09/2014  . Thyroid enlargement 11/09/2014  . Hypothyroidism  11/09/2014  . Hyperlipidemia 11/09/2014  . History of breast lump/mass excision 11/09/2014  . Multiple sclerosis 09/10/2013  . Abnormality of gait 09/10/2013   Starr Lake PT, DPT, LAT, ATC  04/24/2015  3:00 PM   [facility] [address] [place], [place], [address] Phone: [phone]   Fax:  [phone]      PHYSICAL THERAPY DISCHARGE SUMMARY  Visits from Start of Care: 1  Current functional level related to goals / functional outcomes: FOTO 39% limited   Remaining deficits: Limited shoulder mobility/ strength secondary to pain and stiffness   Education / Equipment: HEP   Plan: Patient agrees to discharge.  Patient goals were not met. Patient is being discharged due to not returning since the last visit.  ?????       Binnie Vonderhaar PT, DPT, LAT, ATC  11/02/2015  3:45 PM

## 2015-04-24 NOTE — Patient Instructions (Signed)
   Kristoffer Leamon PT, DPT, LAT, ATC  [facility] Phone: [phone]     

## 2015-05-08 ENCOUNTER — Other Ambulatory Visit: Payer: Self-pay

## 2015-05-08 ENCOUNTER — Ambulatory Visit: Payer: Commercial Managed Care - HMO | Admitting: Physical Therapy

## 2015-05-08 MED ORDER — DIMETHYL FUMARATE 240 MG PO CPDR: 240.0000 mg | DELAYED_RELEASE_CAPSULE | Freq: Two times a day (BID) | ORAL | Status: DC

## 2015-05-11 ENCOUNTER — Ambulatory Visit: Payer: Commercial Managed Care - HMO | Admitting: Physical Therapy

## 2015-05-15 ENCOUNTER — Ambulatory Visit: Payer: Commercial Managed Care - HMO | Admitting: Physical Therapy

## 2015-05-18 ENCOUNTER — Ambulatory Visit: Payer: Commercial Managed Care - HMO | Admitting: Physical Therapy

## 2015-05-22 ENCOUNTER — Encounter: Payer: Commercial Managed Care - HMO | Admitting: Physical Therapy

## 2015-05-25 ENCOUNTER — Ambulatory Visit: Payer: Commercial Managed Care - HMO | Attending: Family Medicine | Admitting: Physical Therapy

## 2015-05-25 DIAGNOSIS — R29898 Other symptoms and signs involving the musculoskeletal system: Secondary | ICD-10-CM | POA: Insufficient documentation

## 2015-05-25 DIAGNOSIS — M25511 Pain in right shoulder: Secondary | ICD-10-CM | POA: Insufficient documentation

## 2015-05-25 DIAGNOSIS — M25611 Stiffness of right shoulder, not elsewhere classified: Secondary | ICD-10-CM | POA: Insufficient documentation

## 2015-05-25 DIAGNOSIS — M7580 Other shoulder lesions, unspecified shoulder: Secondary | ICD-10-CM | POA: Insufficient documentation

## 2015-06-01 ENCOUNTER — Ambulatory Visit: Payer: Commercial Managed Care - HMO | Admitting: Physical Therapy

## 2015-06-19 ENCOUNTER — Other Ambulatory Visit: Payer: Self-pay | Admitting: Family Medicine

## 2015-07-05 ENCOUNTER — Ambulatory Visit: Payer: Commercial Managed Care - HMO | Admitting: Family Medicine

## 2015-07-06 ENCOUNTER — Encounter: Payer: Self-pay | Admitting: Family Medicine

## 2015-07-06 ENCOUNTER — Ambulatory Visit (INDEPENDENT_AMBULATORY_CARE_PROVIDER_SITE_OTHER): Payer: Commercial Managed Care - HMO | Admitting: Family Medicine

## 2015-07-06 VITALS — BP 142/77 | HR 94 | Temp 98.5°F | Wt 185.0 lb

## 2015-07-06 DIAGNOSIS — H109 Unspecified conjunctivitis: Secondary | ICD-10-CM | POA: Diagnosis not present

## 2015-07-06 MED ORDER — POLYMYXIN B-TRIMETHOPRIM 10000-0.1 UNIT/ML-% OP SOLN: 1.0000 [drp] | OPHTHALMIC | Status: AC

## 2015-07-06 MED ORDER — POLYMYXIN B-TRIMETHOPRIM 10000-0.1 UNIT/ML-% OP SOLN
1.0000 [drp] | OPHTHALMIC | Status: DC
Start: 2015-07-06 — End: 2015-07-06

## 2015-07-06 NOTE — Patient Instructions (Signed)
Thank you for coming to see me today. It was a pleasure. Today we talked about:   Conjunctivitis: This may be bacterial, however, viral conjunctivitis is more common. If you start having eye pain, trouble moving your eyes, trouble seeing, please return immediately.  If you have any questions or concerns, please do not hesitate to call the office at [phone].  Sincerely,  Cordelia Poche, MD   Allergic Conjunctivitis The conjunctiva is a thin membrane that covers the visible white part of the eyeball and the underside of the eyelids. This membrane protects and lubricates the eye. The membrane has small blood vessels running through it that can normally be seen. When the conjunctiva becomes inflamed, the condition is called conjunctivitis. In response to the inflammation, the conjunctival blood vessels become swollen. The swelling results in redness in the normally white part of the eye. The blood vessels of this membrane also react when a person has allergies and is then called allergic conjunctivitis. This condition usually lasts for as long as the allergy persists. Allergic conjunctivitis cannot be passed to another person (non-contagious). The likelihood of bacterial infection is great and the cause is not likely due to allergies if the inflamed eye has:  A sticky discharge.  Discharge or sticking together of the lids in the morning.  Scaling or flaking of the eyelids where the eyelashes come out.  Red swollen eyelids. CAUSES   Viruses.  Irritants such as foreign bodies.  Chemicals.  General allergic reactions.  Inflammation or serious diseases in the inside or the outside of the eye or the orbit (the boney cavity in which the eye sits) can cause a "red eye." SYMPTOMS   Eye redness.  Tearing.  Itchy eyes.  Burning feeling in the eyes.  Clear drainage from the eye.  Allergic reaction due to pollens or ragweed sensitivity. Seasonal allergic conjunctivitis is frequent  in the spring when pollens are in the air and in the fall. DIAGNOSIS  This condition, in its many forms, is usually diagnosed based on the history and an ophthalmological exam. It usually involves both eyes. If your eyes react at the same time every year, allergies may be the cause. While most "red eyes" are due to allergy or an infection, the role of an eye (ophthalmological) exam is important. The exam can rule out serious diseases of the eye or orbit. TREATMENT   Non-antibiotic eye drops, ointments, or medications by mouth may be prescribed if the ophthalmologist is sure the conjunctivitis is due to allergies alone.  Over-the-counter drops and ointments for allergic symptoms should be used only after other causes of conjunctivitis have been ruled out, or as your caregiver suggests. Medications by mouth are often prescribed if other allergy-related symptoms are present. If the ophthalmologist is sure that the conjunctivitis is due to allergies alone, treatment is normally limited to drops or ointments to reduce itching and burning. HOME CARE INSTRUCTIONS   Wash hands before and after applying drops or ointments, or touching the inflamed eye(s) or eyelids.  Do not let the eye dropper tip or ointment tube touch the eyelid when putting medicine in your eye.  Stop using your soft contact lenses and throw them away. Use a new pair of lenses when recovery is complete. You should run through sterilizing cycles at least three times before use after complete recovery if the old soft contact lenses are to be used. Hard contact lenses should be stopped. They need to be thoroughly sterilized before use after recovery.  Itching and burning eyes due to allergies is often relieved by using a cool cloth applied to closed eye(s). SEEK MEDICAL CARE IF:   Your problems do not go away after two or three days of treatment.  Your lids are sticky (especially in the morning when you wake up) or stick  together.  Discharge develops. Antibiotics may be needed either as drops, ointment, or by mouth.  You have extreme light sensitivity.  An oral temperature above 102 F (38.9 C) develops.  Pain in or around the eye or any other visual symptom develops. MAKE SURE YOU:   Understand these instructions.  Will watch your condition.  Will get help right away if you are not doing well or get worse. Document Released: 03/08/2003 Document Revised: 03/09/2012 Document Reviewed: 02/01/2008 [facility] Patient Information 2015 [place], [place]. This information is not intended to replace advice given to you by your health care provider. Make sure you discuss any questions you have with your health care provider.

## 2015-07-06 NOTE — Progress Notes (Signed)
    Subjective   Mariah Jackson is a 54 y.o. female that presents for a same day visit  1. Bilateral conjunctivitis: Symptoms started one week ago. Her niece had pink eye last week and was treated with antibacterial eye drops. Symptoms include itching, burning, dryness. She also has some pain associated with her eyes. No recent trauma to her eye. She has had some swelling in her eyelids bilaterally and some discharge in the mornings. No fevers, nausea, vomiting.   ROS Per HPI  History  Substance Use Topics  . Smoking status: Never Smoker   . Smokeless tobacco: Never Used  . Alcohol Use: No    Allergies  Allergen Reactions  . Vicodin [Hydrocodone-Acetaminophen] Nausea Only    Objective   BP 142/77 mmHg  Pulse 94  Temp(Src) 98.5 F (36.9 C)  Wt 185 lb (83.915 kg)  General: Well appearing, cane in hand HEENT: EOMI, bilateral conjunctivitis with mild swelling of upper eyelid. Conjunctivitis is limbic sparing. Mild yellow and crusted discharge noticed. No hordeolum seen. No orbital tenderness  Assessment and Plan   Meds ordered this encounter  Medications  . DISCONTD: trimethoprim-polymyxin b (POLYTRIM) ophthalmic solution    Sig: Place 1-2 drops into both eyes every 4 (four) hours.    Dispense:  10 mL    Refill:  0  . trimethoprim-polymyxin b (POLYTRIM) ophthalmic solution    Sig: Place 1-2 drops into both eyes every 4 (four) hours.    Dispense:  10 mL    Refill:  0    Conjunctivitis, bilateral: Patient's visual acuity intact. Sick contact  polytrim eye drops 1-2 drops each eye QID x 5 days

## 2015-07-07 ENCOUNTER — Telehealth: Payer: Self-pay | Admitting: *Deleted

## 2015-07-07 MED ORDER — ERYTHROMYCIN 5 MG/GM OP OINT: 1.0000 "application " | TOPICAL_OINTMENT | Freq: Four times a day (QID) | OPHTHALMIC | Status: DC

## 2015-07-07 NOTE — Telephone Encounter (Signed)
Sent in Erythromycin Eye drops.

## 2015-07-07 NOTE — Telephone Encounter (Signed)
Pt states that the eye drops prescribed to her are making her "eyes really dry and slightly painful".  Wants to know if anything else can be called in?  Saw Dr. Lonny Prude yesterday.  [facility] on [address]

## 2015-07-07 NOTE — Telephone Encounter (Signed)
Will forward to Dr. Lonny Prude. Abrina Petz,CMA

## 2015-07-07 NOTE — Telephone Encounter (Signed)
Pt informed. Zimmerman Rumple, Nikhil Osei D, CMA  

## 2015-07-27 ENCOUNTER — Telehealth: Payer: Self-pay | Admitting: Nurse Practitioner

## 2015-07-27 DIAGNOSIS — G35 Multiple sclerosis: Secondary | ICD-10-CM

## 2015-07-27 NOTE — Telephone Encounter (Signed)
I called and could not LM due to VM full.  Will try later.

## 2015-07-27 NOTE — Telephone Encounter (Signed)
Order placed for DME- Rolling seated walker.  Dr. Krista Blue to sign.

## 2015-07-27 NOTE — Telephone Encounter (Signed)
Pt is requesting a Rx for a walker. Please call and advise [phone]

## 2015-07-27 NOTE — Telephone Encounter (Signed)
Signed, and placed up front.  Pt or family member to pick up.

## 2015-08-02 ENCOUNTER — Telehealth: Payer: Self-pay | Admitting: Neurology

## 2015-08-02 NOTE — Telephone Encounter (Signed)
She spoke to [facility] - she was instructed to go the [employer].

## 2015-08-02 NOTE — Telephone Encounter (Signed)
Please call pt regarding where to take prescription for rolling seated walker

## 2015-09-12 ENCOUNTER — Other Ambulatory Visit: Payer: Self-pay | Admitting: Family Medicine

## 2015-09-12 DIAGNOSIS — Z1231 Encounter for screening mammogram for malignant neoplasm of breast: Secondary | ICD-10-CM

## 2015-09-18 ENCOUNTER — Ambulatory Visit (HOSPITAL_COMMUNITY)
Admission: RE | Admit: 2015-09-18 | Discharge: 2015-09-18 | Disposition: A | Discharge status: Home / Self Care | Payer: Commercial Managed Care - HMO | Source: Ambulatory Visit | Attending: Family Medicine | Admitting: Family Medicine

## 2015-09-18 ENCOUNTER — Ambulatory Visit: Payer: Commercial Managed Care - HMO | Admitting: Nurse Practitioner

## 2015-09-18 ENCOUNTER — Ambulatory Visit (HOSPITAL_COMMUNITY): Payer: Commercial Managed Care - HMO

## 2015-09-18 DIAGNOSIS — Z1231 Encounter for screening mammogram for malignant neoplasm of breast: Secondary | ICD-10-CM | POA: Diagnosis not present

## 2015-09-19 ENCOUNTER — Ambulatory Visit (INDEPENDENT_AMBULATORY_CARE_PROVIDER_SITE_OTHER): Payer: Commercial Managed Care - HMO | Admitting: Nurse Practitioner

## 2015-09-19 ENCOUNTER — Encounter: Payer: Self-pay | Admitting: Nurse Practitioner

## 2015-09-19 VITALS — BP 143/91 | HR 92 | Ht 64.0 in | Wt 191.2 lb

## 2015-09-19 DIAGNOSIS — I10 Essential (primary) hypertension: Secondary | ICD-10-CM

## 2015-09-19 DIAGNOSIS — G35 Multiple sclerosis: Secondary | ICD-10-CM

## 2015-09-19 DIAGNOSIS — Z5181 Encounter for therapeutic drug level monitoring: Secondary | ICD-10-CM | POA: Diagnosis not present

## 2015-09-19 DIAGNOSIS — R269 Unspecified abnormalities of gait and mobility: Secondary | ICD-10-CM

## 2015-09-19 MED ORDER — DIMETHYL FUMARATE 240 MG PO CPDR: 240.0000 mg | DELAYED_RELEASE_CAPSULE | Freq: Two times a day (BID) | ORAL | Status: DC

## 2015-09-19 NOTE — Progress Notes (Signed)
[facility]  PATIENT: Mariah Jackson DOB: 1961-02-28   REASON FOR VISIT: Follow-up for relapsing remitting multiple sclerosis, abnormality of gait HISTORY FROM: Patient    HISTORY OF PRESENT ILLNESS: HISTORY: [name], 54 year old female returns for followup for RRMS She was diagnosed with multiple sclerosis in 2001, by Dr. Dellis Filbert at [facility]. The disease has primarily affected her balance and gait. She could only clarify one exacerbations, presenting as worsening balance difficulty, and says she has never been treated with intravenous steroids.   She was placed on Betaseron shortly after diagnosis, and has been on that drug ever since. Her gait difficulty had little changes over past 11 years. She ambulates with a cane, but is able to get around the house pretty well without it.   Most recent MRI was in January 2011,shows multiple periventricular, periatrial and subcortical white matter hyperintensities suggestive of demyelinating disease.No enhancing lesions or significant atrophy is seen. compared to MRI films from Aug 2000 there appear to be more T2 lesions in right frontal subcortical region.   She is tired of taking the shots and does have multiple areas of lipoatrophy, especially on the abdominal area. She is ambulating with a cane. She is wishing to switch to an oral preparation, was switched to [facility] Oct 2014, doing well.  UPDATE Sep 18th 2015: She denies signficant side effect from Tecfidera. She has been on Ampyra 10mg  bid for few years, which definitely has helped her walking  MRI brain March 2014: Multiple periventricular and subcortical chronic demyelinating disease. No acute plaques are seen. MRI cervical spine: Disc bulging at C5-6. Spondylosis at C5-6 and C6-7. C5-6 there is focal disc protrusion slightly deforms anterior spinal cord, with spinal stenosis or foraminal stenosis. No intrinsic or enhancing spinal cord lesions.  UPDATE March 17th  2016: She fell in Feb 2016, now with right shoulder and neck pain, overall has improved, she is continue taking Tecfidera, Ampyra, denies significant side effect, ambulate with spastic gait, wear her right ankle brace. UPDATE 09/19/2015 Ms.Mariah Jackson, 54 year old female returns for follow-up. She was last in the office by Dr. Krista Blue 03/16/2015. She has a history of relapsing remitting multiple sclerosis. She was diagnosed in 2001. She is currently on tecfidera and denies any side effects. She is not having any exacerbation of symptoms. She also is on Ampyra  for her gait abnormality which has been very beneficial. She denies any recent falls. She ambulates with a single-point cane. She has had no new weakness or sensory changes, visual disturbance, speech or swallowing difficulties or loss of bowel or bladder control since last seen. She returns for reevaluation     REVIEW OF SYSTEMS: Full 14 system review of systems performed and notable only for those listed, all others are neg:  Constitutional: neg  Cardiovascular: neg Ear/Nose/Throat: neg  Skin: neg Eyes: neg Respiratory: neg Gastroitestinal: neg  Hematology/Lymphatic: neg  Endocrine: neg Musculoskeletal: Joint pain Allergy/Immunology: neg Neurological: neg Psychiatric: neg Sleep : neg   ALLERGIES: Allergies  Allergen Reactions  . Vicodin [Hydrocodone-Acetaminophen] Nausea Only    HOME MEDICATIONS: Outpatient Prescriptions Prior to Visit  Medication Sig Dispense Refill  . amLODipine (NORVASC) 10 MG tablet Take 1 tablet (10 mg total) by mouth daily. 90 tablet 4  . dalfampridine (AMPYRA) 10 MG TB12 Take 1 tablet (10 mg total) by mouth every 12 (twelve) hours. 180 tablet 3  . diclofenac sodium (VOLTAREN) 1 % GEL Apply 4 g topically 4 (four) times daily. 100 g 6  . Dimethyl Fumarate (  TECFIDERA) 240 MG CPDR Take 1 capsule (240 mg total) by mouth 2 (two) times daily. 180 capsule 1  . ibuprofen (ADVIL,MOTRIN) 600 MG tablet TAKE 1 TABLET  EVERY 8 HOURS AS NEEDED 30 tablet 0  . levothyroxine (SYNTHROID, LEVOTHROID) 100 MCG tablet TAKE 1 TABLET DAILY BEFORE BREAKFAST. 90 tablet 5  . Multiple Vitamin (MULTIVITAMIN) tablet Take 1 tablet by mouth daily.    . rosuvastatin (CRESTOR) 10 MG tablet Take 1 tablet (10 mg total) by mouth daily. 90 tablet 3  . celecoxib (CELEBREX) 100 MG capsule Take 1 capsule (100 mg total) by mouth 2 (two) times daily. 60 capsule 3  . erythromycin ophthalmic ointment Place 1 application into both eyes 4 (four) times daily. 3.5 g 0  . methocarbamol (ROBAXIN) 500 MG tablet Take 2 tablets (1,000 mg total) by mouth 4 (four) times daily as needed (Pain). 20 tablet 0  . ondansetron (ZOFRAN) 4 MG tablet Take 1 tablet (4 mg total) by mouth every 6 (six) hours as needed for nausea or vomiting. 4 tablet 0  . oxyCODONE-acetaminophen (PERCOCET) 5-325 MG per tablet Take 1 tablet by mouth every 8 (eight) hours as needed for severe pain. 40 tablet 0   No facility-administered medications prior to visit.    PAST MEDICAL HISTORY: Past Medical History  Diagnosis Date  . Multiple sclerosis   . Hypothyroidism   . Hypertension   . Hyperlipidemia   . Breast cancer 2007    left    PAST SURGICAL HISTORY: Past Surgical History  Procedure Laterality Date  . Tubal ligation  1985  . Breast lumpectomy  2007    left    FAMILY HISTORY: Family History  Problem Relation Age of Onset  . Colon cancer Neg Hx   . Heart attack Father     SOCIAL HISTORY: Social History   Social History  . Marital Status: Divorced    Spouse Name: N/A  . Number of Children: 2  . Years of Education: 12   Occupational History  . Unemployed    . Disabiliyt    Social History Main Topics  . Smoking status: Never Smoker   . Smokeless tobacco: Never Used  . Alcohol Use: No  . Drug Use: No  . Sexual Activity: No   Other Topics Concern  . Not on file   Social History Narrative   Patient lives at home with her daughter and son.     Patient has 2 children.    Patient is divorced.    Patient a high school education.    Patient is unemployed.      PHYSICAL EXAM  Filed Vitals:   09/19/15 0946  BP: 143/91  Pulse: 92  Height: 5\' 4"  (1.626 m)  Weight: 191 lb 3.2 oz (86.728 kg)   Body mass index is 32.8 kg/(m^2).  Generalized: Well developed, obese female in no acute distress  Head: normocephalic and atraumatic,. Oropharynx benign  Neck: Supple, no carotid bruits  Cardiac: Regular rate rhythm, no murmur  Musculoskeletal: No deformity   Neurological examination   Mentation: Alert oriented to time, place, history taking. Attention span and concentration appropriate. Recent and remote memory intact.  Follows all commands speech and language fluent.   Cranial nerve II-XII: Visual acuity 20/30 bilaterally. Fundoscopic exam reveals sharp disc margins.Pupils were equal round reactive to light extraocular movements were full, visual field were full on confrontational test. Facial sensation and strength were normal. hearing was intact to finger rubbing bilaterally. Uvula tongue midline. head turning  and shoulder shrug were normal and symmetric.Tongue protrusion into cheek strength was normal. Motor: Right ankle brace intact mild bilateral lower extremity spasticity mild bilateral hip flexion and ankle dorsiflexion weakness Sensory: normal and symmetric to light touch, pinprick, and  Vibration,  Coordination: finger-nose-finger, heel-to-shin bilaterally, no dysmetria Reflexes: Brachioradialis 2/2, biceps 2/2, triceps 2/2, patellar 2/2, Achilles 2/2, plantar responses were flexor bilaterally. Gait and Station: Rising up from seated position without assistance, wide based cautious mildly unsteady gait. Ambulates with single-point cane DIAGNOSTIC DATA (LABS, IMAGING, TESTING)   ASSESSMENT AND PLAN  54 y.o. year old female  has a past medical history of Multiple sclerosis; relapsing remitting and gait abnormality here to  follow-up. Last MRI of the brain and cervical spine March 2014. No exacerbation of symptoms in several years.   Continue tecfidera 240 twice daily Continue Ampyra 10 mg twice daily Labs today, CBC and CMP Call for exacerbation of symptoms Follow-up in 6 months Dennie Bible, [facility], [facility], [facility] [address], [place], [place] [address] [phone]

## 2015-09-19 NOTE — Progress Notes (Signed)
I have reviewed and agreed above plan. 

## 2015-09-19 NOTE — Patient Instructions (Signed)
Continue tecfidera 240 twice daily Continue Ampyra 10 mg twice daily Labs today Call for exacerbation of symptoms Follow-up in 6 months

## 2015-09-20 ENCOUNTER — Telehealth: Payer: Self-pay | Admitting: *Deleted

## 2015-09-20 LAB — COMPREHENSIVE METABOLIC PANEL
A/G RATIO: 1.5 (ref 1.1–2.5)
ALT: 15 IU/L (ref 0–32)
AST: 16 IU/L (ref 0–40)
Albumin: 4.5 g/dL (ref 3.5–5.5)
Alkaline Phosphatase: 136 IU/L — ABNORMAL HIGH (ref 39–117)
BUN/Creatinine Ratio: 14 (ref 9–23)
BUN: 9 mg/dL (ref 6–24)
Bilirubin Total: 0.4 mg/dL (ref 0.0–1.2)
CALCIUM: 10.2 mg/dL (ref 8.7–10.2)
CO2: 22 mmol/L (ref 18–29)
Chloride: 102 mmol/L (ref 97–108)
Creatinine, Ser: 0.63 mg/dL (ref 0.57–1.00)
GFR calc Af Amer: 116 mL/min/{1.73_m2} (ref >59)
GFR, EST NON AFRICAN AMERICAN: 101 mL/min/{1.73_m2} (ref >59)
Globulin, Total: 3.1 g/dL (ref 1.5–4.5)
Glucose: 103 mg/dL — ABNORMAL HIGH (ref 65–99)
Potassium: 3.7 mmol/L (ref 3.5–5.2)
Sodium: 143 mmol/L (ref 134–144)
TOTAL PROTEIN: 7.6 g/dL (ref 6.0–8.5)

## 2015-09-20 LAB — CBC WITH DIFFERENTIAL/PLATELET
BASOS: 0 %
Basophils Absolute: 0 10*3/uL (ref 0.0–0.2)
EOS (ABSOLUTE): 0.2 10*3/uL (ref 0.0–0.4)
Eos: 5 %
HEMOGLOBIN: 14 g/dL (ref 11.1–15.9)
Hematocrit: 42.3 % (ref 34.0–46.6)
IMMATURE GRANS (ABS): 0 10*3/uL (ref 0.0–0.1)
IMMATURE GRANULOCYTES: 0 %
LYMPHS: 18 %
Lymphocytes Absolute: 0.7 10*3/uL (ref 0.7–3.1)
MCH: 25.4 pg — AB (ref 26.6–33.0)
MCHC: 33.1 g/dL (ref 31.5–35.7)
MCV: 77 fL — AB (ref 79–97)
MONOCYTES: 9 %
Monocytes Absolute: 0.3 10*3/uL (ref 0.1–0.9)
Neutrophils Absolute: 2.6 10*3/uL (ref 1.4–7.0)
Neutrophils: 68 %
Platelets: 247 10*3/uL (ref 150–379)
RBC: 5.51 x10E6/uL — ABNORMAL HIGH (ref 3.77–5.28)
RDW: 15.4 % (ref 12.3–15.4)
WBC: 3.8 10*3/uL (ref 3.4–10.8)

## 2015-09-20 NOTE — Telephone Encounter (Signed)
I spoke to pt and relayed that her lab work results looked good.  She verbalized understanding.

## 2015-09-20 NOTE — Telephone Encounter (Signed)
-----   Message from Dennie Bible, NP sent at 09/20/2015  7:54 AM EDT ----- Labs ok please call

## 2015-10-02 ENCOUNTER — Telehealth: Payer: Self-pay | Admitting: Family Medicine

## 2015-10-02 NOTE — Telephone Encounter (Signed)
Informed of normal mammogram.   CGM MD

## 2015-10-14 ENCOUNTER — Other Ambulatory Visit: Payer: Self-pay | Admitting: Family Medicine

## 2016-01-05 ENCOUNTER — Other Ambulatory Visit: Payer: Self-pay | Admitting: Family Medicine

## 2016-01-18 ENCOUNTER — Other Ambulatory Visit: Payer: Self-pay | Admitting: Family Medicine

## 2016-02-26 ENCOUNTER — Other Ambulatory Visit: Payer: Self-pay | Admitting: *Deleted

## 2016-02-26 MED ORDER — DALFAMPRIDINE ER 10 MG PO TB12: 10.0000 mg | ORAL_TABLET | Freq: Two times a day (BID) | ORAL | Status: DC

## 2016-03-12 ENCOUNTER — Telehealth: Payer: Self-pay | Admitting: *Deleted

## 2016-03-12 NOTE — Telephone Encounter (Signed)
Approved by Mcarthur Rossetti  ID [id number]  Tecfidera 240mg  capsules  Good until 12-29-16.  [id number].

## 2016-03-13 ENCOUNTER — Telehealth: Payer: Self-pay | Admitting: Family Medicine

## 2016-03-13 DIAGNOSIS — G35 Multiple sclerosis: Secondary | ICD-10-CM

## 2016-03-13 NOTE — Telephone Encounter (Signed)
Need a referral to [facility].  Have an appt on Mon, 3/20.

## 2016-03-13 NOTE — Telephone Encounter (Signed)
Will forward to MD to place referral for neurology for MS.  Patient has [employer] and this referral/authorization has to be placed every 6 months. Mckaylie Vasey,CMA

## 2016-03-18 ENCOUNTER — Encounter: Payer: Self-pay | Admitting: Nurse Practitioner

## 2016-03-18 ENCOUNTER — Ambulatory Visit (INDEPENDENT_AMBULATORY_CARE_PROVIDER_SITE_OTHER): Payer: Commercial Managed Care - HMO | Admitting: Nurse Practitioner

## 2016-03-18 VITALS — BP 134/86 | HR 85 | Ht 64.0 in | Wt 203.2 lb

## 2016-03-18 DIAGNOSIS — Z5181 Encounter for therapeutic drug level monitoring: Secondary | ICD-10-CM | POA: Diagnosis not present

## 2016-03-18 DIAGNOSIS — R269 Unspecified abnormalities of gait and mobility: Secondary | ICD-10-CM

## 2016-03-18 DIAGNOSIS — G35 Multiple sclerosis: Secondary | ICD-10-CM | POA: Diagnosis not present

## 2016-03-18 NOTE — Progress Notes (Signed)
I have reviewed and agreed above plan. 

## 2016-03-18 NOTE — Patient Instructions (Signed)
Continue tecfidera 240 twice daily Continue Ampyra 10 mg twice daily Labs today, CMP Call for exacerbation of symptoms Follow-up in 6 months

## 2016-03-18 NOTE — Progress Notes (Signed)
[facility]  PATIENT: Mariah Jackson DOB: 1961/03/01   REASON FOR VISIT: Follow-up for multiple sclerosis, abnormality of gait HISTORY FROM: Patient    HISTORY OF PRESENT ILLNESS: HISTORY: Mariah Jackson, 55 year old female returns for followup for RRMS She was diagnosed with multiple sclerosis in 2001, by Dr. Dellis Filbert at [facility]. The disease has primarily affected her balance and gait. She could only clarify one exacerbations, presenting as worsening balance difficulty, and says she has never been treated with intravenous steroids.   She was placed on Betaseron shortly after diagnosis, and has been on that drug ever since. Her gait difficulty had little changes over past 11 years. She ambulates with a cane, but is able to get around the house pretty well without it.   Most recent MRI was in January 2011,shows multiple periventricular, periatrial and subcortical white matter hyperintensities suggestive of demyelinating disease.No enhancing lesions or significant atrophy is seen. compared to MRI films from Aug 2000 there appear to be more T2 lesions in right frontal subcortical region.   She is tired of taking the shots and does have multiple areas of lipoatrophy, especially on the abdominal area. She is ambulating with a cane. She is wishing to switch to an oral preparation, was switched to [facility] Oct 2014, doing well.  UPDATE Sep 18th 2015: She denies signficant side effect from Tecfidera. She has been on Ampyra 10mg  bid for few years, which definitely has helped her walking  MRI brain March 2014: Multiple periventricular and subcortical chronic demyelinating disease. No acute plaques are seen. MRI cervical spine: Disc bulging at C5-6. Spondylosis at C5-6 and C6-7. C5-6 there is focal disc protrusion slightly deforms anterior spinal cord, with spinal stenosis or foraminal stenosis. No intrinsic or enhancing spinal cord lesions.  UPDATE March 17th 2016: She fell in  Feb 2016, now with right shoulder and neck pain, overall has improved, she is continue taking Tecfidera, Ampyra, denies significant side effect, ambulate with spastic gait, wear her right ankle brace. UPDATE 03/18/2016 CMMs.Mariah Jackson, 55 year old female returns for follow-up. She was last seen in the office 09/19/15.  She has a history of relapsing remitting multiple sclerosis. She was diagnosed in 2001. She is currently on tecfidera and denies any side effects. She is not having any exacerbation of symptoms. She also is on Ampyra for her gait abnormality which has been very beneficial. She denies any recent falls. She ambulates with a single-point cane. She has had no new weakness or sensory changes, visual disturbance, speech or swallowing difficulties or loss of bowel or bladder control since last seen. She returns for reevaluation     REVIEW OF SYSTEMS: Full 14 system review of systems performed and notable only for those listed, all others are neg:  Constitutional: neg  Cardiovascular: neg Ear/Nose/Throat: neg  Skin: neg Eyes: Blurred vision Respiratory: neg Gastroitestinal: neg  Hematology/Lymphatic: neg  Endocrine: neg Musculoskeletal: Aching muscles Allergy/Immunology: neg Neurological: neg Psychiatric: neg Sleep : neg   ALLERGIES: Allergies  Allergen Reactions  . Vicodin [Hydrocodone-Acetaminophen] Nausea Only    HOME MEDICATIONS: Outpatient Prescriptions Prior to Visit  Medication Sig Dispense Refill  . amLODipine (NORVASC) 10 MG tablet Take 1 tablet (10 mg total) by mouth daily. 90 tablet 4  . dalfampridine (AMPYRA) 10 MG TB12 Take 1 tablet (10 mg total) by mouth every 12 (twelve) hours. 180 tablet 3  . diclofenac sodium (VOLTAREN) 1 % GEL Apply 4 g topically 4 (four) times daily. 100 g 6  . Dimethyl Fumarate (TECFIDERA) 240 MG  CPDR Take 1 capsule (240 mg total) by mouth 2 (two) times daily. 180 capsule 2  . ibuprofen (ADVIL,MOTRIN) 600 MG tablet TAKE 1 TABLET EVERY 8 HOURS  AS NEEDED 30 tablet 0  . levothyroxine (SYNTHROID, LEVOTHROID) 100 MCG tablet TAKE 1 TABLET DAILY BEFORE BREAKFAST. 90 tablet 5  . Multiple Vitamin (MULTIVITAMIN) tablet Take 1 tablet by mouth daily.    . rosuvastatin (CRESTOR) 10 MG tablet TAKE 1 TABLET EVERY DAY 90 tablet 1   No facility-administered medications prior to visit.    PAST MEDICAL HISTORY: Past Medical History  Diagnosis Date  . Multiple sclerosis ([place])   . Hypothyroidism   . Hypertension   . Hyperlipidemia   . Breast cancer ([place]) 2007    left    PAST SURGICAL HISTORY: Past Surgical History  Procedure Laterality Date  . Tubal ligation  1985  . Breast lumpectomy  2007    left    FAMILY HISTORY: Family History  Problem Relation Age of Onset  . Colon cancer Neg Hx   . Heart attack Father     SOCIAL HISTORY: Social History   Social History  . Marital Status: Divorced    Spouse Name: N/A  . Number of Children: 2  . Years of Education: 12   Occupational History  . Unemployed    . Disabiliyt    Social History Main Topics  . Smoking status: Never Smoker   . Smokeless tobacco: Never Used  . Alcohol Use: No  . Drug Use: No  . Sexual Activity: No   Other Topics Concern  . Not on file   Social History Narrative   Patient lives at home with her daughter and son.    Patient has 2 children.    Patient is divorced.    Patient a high school education.    Patient is unemployed.      PHYSICAL EXAM  Filed Vitals:   03/18/16 0926  BP: 134/86  Pulse: 85  Height: 5\' 4"  (1.626 m)  Weight: 203 lb 3.2 oz (92.171 kg)   Body mass index is 34.86 kg/(m^2). Generalized: Well developed, obese female in no acute distress  Head: normocephalic and atraumatic,. Oropharynx benign  Neck: Supple, no carotid bruits  Cardiac: Regular rate rhythm, no murmur  Musculoskeletal: No deformity   Neurological examination   Mentation: Alert oriented to time, place, history taking. Attention span and concentration  appropriate. Recent and remote memory intact. Follows all commands speech and language fluent.   Cranial nerve II-XII:  Fundoscopic exam reveals sharp disc margins.Pupils were equal round reactive to light extraocular movements were full, visual field were full on confrontational test. Facial sensation and strength were normal. hearing was intact to finger rubbing bilaterally. Uvula tongue midline. head turning and shoulder shrug were normal and symmetric.Tongue protrusion into cheek strength was normal. Motor: mild bilateral lower extremity spasticity mild bilateral hip flexion and ankle dorsiflexion weakness Sensory: normal and symmetric to light touch, pinprick, and Vibration,  Coordination: finger-nose-finger, heel-to-shin bilaterally, no dysmetria Reflexes: Brachioradialis 2/2, biceps 2/2, triceps 2/2, patellar 2/2, Achilles 2/2, plantar responses were flexor bilaterally. Gait and Station: Rising up from seated position without assistance, wide based cautious mildly unsteady gait. Ambulates with single-point cane  DIAGNOSTIC DATA (LABS, IMAGING, TESTING) - I reviewed patient records, labs, notes, testing and imaging myself where available.  Lab Results  Component Value Date   WBC 3.8 09/19/2015   HGB 13.8 09/16/2014   HCT 42.3 09/19/2015   MCV 77* 09/19/2015  PLT 247 09/19/2015      Component Value Date/Time   NA 143 09/19/2015 1027   K 3.7 09/19/2015 1027   CL 102 09/19/2015 1027   CO2 22 09/19/2015 1027   GLUCOSE 103* 09/19/2015 1027   BUN 9 09/19/2015 1027   CREATININE 0.63 09/19/2015 1027   CALCIUM 10.2 09/19/2015 1027   PROT 7.6 09/19/2015 1027   ALBUMIN 4.5 09/19/2015 1027   AST 16 09/19/2015 1027   ALT 15 09/19/2015 1027   ALKPHOS 136* 09/19/2015 1027   BILITOT 0.4 09/19/2015 1027   BILITOT 0.7 09/16/2014 1336   GFRNONAA 101 09/19/2015 1027   GFRAA 116 09/19/2015 1027    ASSESSMENT AND PLAN 55 y.o. year old female has a past medical history of Multiple  sclerosis; relapsing remitting and gait abnormality here to follow-up. MRI brain March 2014: Multiple periventricular and subcortical chronic demyelinating disease. No acute plaques are seen. MRI cervical spine: Disc bulging at C5-6. Spondylosis at C5-6 and C6-7. C5-6 there is focal disc protrusion slightly deforms anterior spinal cord, with spinal stenosis or foraminal stenosis. No intrinsic or enhancing spinal cord lesions.   Continue tecfidera 240 twice daily Continue Ampyra 10 mg twice daily Labs today, CMP Call for exacerbation of symptoms Follow-up in 6 months Dennie Bible, [facility], [facility], [facility] [address] [place], [place] [address] [phone]

## 2016-03-19 ENCOUNTER — Telehealth: Payer: Self-pay | Admitting: Nurse Practitioner

## 2016-03-19 LAB — COMPREHENSIVE METABOLIC PANEL
A/G RATIO: 1.6 (ref 1.2–2.2)
ALK PHOS: 136 IU/L — AB (ref 39–117)
ALT: 17 IU/L (ref 0–32)
AST: 15 IU/L (ref 0–40)
Albumin: 4.7 g/dL (ref 3.5–5.5)
BUN / CREAT RATIO: 15 (ref 9–23)
BUN: 11 mg/dL (ref 6–24)
Bilirubin Total: 0.5 mg/dL (ref 0.0–1.2)
CALCIUM: 10.3 mg/dL — AB (ref 8.7–10.2)
CO2: 21 mmol/L (ref 18–29)
CREATININE: 0.71 mg/dL (ref 0.57–1.00)
Chloride: 103 mmol/L (ref 96–106)
GFR calc Af Amer: 109 mL/min/{1.73_m2} (ref >59)
GFR, EST NON AFRICAN AMERICAN: 95 mL/min/{1.73_m2} (ref >59)
GLOBULIN, TOTAL: 2.9 g/dL (ref 1.5–4.5)
Glucose: 86 mg/dL (ref 65–99)
POTASSIUM: 4.5 mmol/L (ref 3.5–5.2)
Sodium: 144 mmol/L (ref 134–144)
Total Protein: 7.6 g/dL (ref 6.0–8.5)

## 2016-03-19 NOTE — Telephone Encounter (Signed)
Called and spoke to patient and relayed her labs were ok patient understood.

## 2016-03-19 NOTE — Telephone Encounter (Signed)
-----   Message from Dennie Bible, NP sent at 03/19/2016  7:59 AM EDT ----- Labs ok please call the patient

## 2016-03-19 NOTE — Progress Notes (Signed)
Quick Note:  Called and spoke to patient and relayed labs were ok. Patient understood. ______

## 2016-03-27 ENCOUNTER — Telehealth: Payer: Self-pay

## 2016-03-27 NOTE — Telephone Encounter (Signed)
Patient called to describe some symptoms that she has been experiencing lately: heart burn, bloating, and gas that started about a week ago. Patient also stated that she had a stomachal virus and fatigue about 3 to 4 days ago, but she is feeling better now. She is wondering if these symptoms are due to Tecfidera, and she would like to know what she can take for the heart burn. I told her that I would relay the message to Dr. Edwena Felty nurse. Patient voiced understanding and appreciation.

## 2016-03-27 NOTE — Telephone Encounter (Signed)
Dr. Krista Blue reviewed - symptoms unrelated to Tecfidera - she suggested she use OTC Zantac for her heartburn.  Spoke to patient - she is agreeable to plan.

## 2016-04-05 DIAGNOSIS — H5210 Myopia, unspecified eye: Secondary | ICD-10-CM | POA: Diagnosis not present

## 2016-04-05 DIAGNOSIS — H5213 Myopia, bilateral: Secondary | ICD-10-CM | POA: Diagnosis not present

## 2016-04-09 ENCOUNTER — Telehealth: Payer: Self-pay

## 2016-04-09 NOTE — Telephone Encounter (Signed)
I spoke to the patient in re the ESTEEM study. She was asking about the compensation for the study, and I told her that we were still working out [employer] with the sponsor. I also told her that I will give her a call before mailing her the check.

## 2016-04-16 ENCOUNTER — Other Ambulatory Visit: Payer: Self-pay | Admitting: Family Medicine

## 2016-04-22 ENCOUNTER — Telehealth: Payer: Self-pay

## 2016-04-22 ENCOUNTER — Other Ambulatory Visit: Payer: Self-pay | Admitting: Family Medicine

## 2016-04-22 NOTE — Telephone Encounter (Signed)
Patient was calling in re the ESTEEM study. Patient would like to know when she will be compensated for completion of the Patient Reported Outcome (PRO) questionnaires. I told her that we would have an answer for her by [id number]. She voiced understanding and appreciation.

## 2016-05-08 ENCOUNTER — Ambulatory Visit (INDEPENDENT_AMBULATORY_CARE_PROVIDER_SITE_OTHER): Payer: Commercial Managed Care - HMO | Admitting: Obstetrics and Gynecology

## 2016-05-08 ENCOUNTER — Encounter: Payer: Self-pay | Admitting: Obstetrics and Gynecology

## 2016-05-08 VITALS — BP 151/86 | HR 88 | Temp 98.7°F | Ht 64.0 in | Wt 202.0 lb

## 2016-05-08 DIAGNOSIS — M25562 Pain in left knee: Secondary | ICD-10-CM | POA: Diagnosis not present

## 2016-05-08 DIAGNOSIS — Z23 Encounter for immunization: Secondary | ICD-10-CM

## 2016-05-08 DIAGNOSIS — Z114 Encounter for screening for human immunodeficiency virus [HIV]: Secondary | ICD-10-CM | POA: Diagnosis not present

## 2016-05-08 DIAGNOSIS — Z1159 Encounter for screening for other viral diseases: Secondary | ICD-10-CM

## 2016-05-08 NOTE — Progress Notes (Signed)
   Subjective:   Patient ID: Mariah Jackson, female    DOB: 02-28-1961, 55 y.o.   MRN: [id number]  Patient presents for Same Day Appointment  Chief Complaint  Patient presents with  . Leg Pain    leg/ankle pain    HPI: #EXTREMITY PAIN  Location: right ankle (chronic) and calf (new) Pain started: about a week ago  Pain: feels like something hitting against ankle; throbbing Severity: 7/10 Medications tried: ibuprofen helped Recent trauma: no; also denies any injuries; believes she may have over did it with getting around Similar pain previously: yes in ankle but calf pain new Calf is stiff and tight, sharp pain Hard to walk; walks with a cane at baseline Previously had severe injury/accident to right ankle that required her to wear a brace for a while. After this incident in 2001 she has since had issues with ankle and walking Not on blood thinners  Symptoms Redness: no Swelling: yes in ankles Fever: no Weakness: yes Rash: no  Review of Symptoms - see HPI PMH - Significant for MS. Patient does not believe this is her MS causing new calf pain.   Past medical history, surgical, family, and social history reviewed and updated in the EMR as appropriate.  Objective:  BP 151/86 mmHg  Pulse 88  Temp(Src) 98.7 F (37.1 C) (Oral)  Ht 5\' 4"  (1.626 m)  Wt 202 lb (91.627 kg)  BMI 34.66 kg/m2 Vitals and nursing note reviewed  Physical Exam  Constitutional: She is well-developed, well-nourished, and in no distress.  Cardiovascular: Intact distal pulses.   Musculoskeletal:       Right ankle: She exhibits decreased range of motion. She exhibits no swelling, no ecchymosis and normal pulse. No tenderness.       Right lower leg: She exhibits no tenderness and no swelling.  No calf tenderness to palpation. Homan's negative. Right foot with some eversion and right ankle external rotation.   Neurological: She displays weakness and abnormal stance. Gait abnormal.  R. Ankle: No visible  erythema or swelling. Range of motion is limited. Strength is 5/5 in all directions. Stable lateral and medial ligaments  Assessment & Plan:  1. Arthralgia of left lower leg Believe pain in patient's ankle is chronic. Unsure of what is causing calf pain but could be component of her MS flaring up or radiation from abnormalities in ankle. No signs of DVT on exam. Patient has had injury to this leg and foot before but denies any new injury. Discussed benign nature of exam today. Do believe patient would benefit from orthotics to help with foot/ankle deformities. Referral placed to sports medicine.  Discussed proper shoe wear as well. Conservative treatment. Patient to continue bracing as needed and ibuprofen.   2. Need for hepatitis C screening test - Hepatitis C Antibody ordered  3. Screening for HIV (human immunodeficiency virus) - HIV antibody (with reflex) ordered  4. Need for Tdap vaccination -Given Tdap vaccine greater than or equal to 7yo IM   Luiz Blare, DO 05/08/2016, 10:02 AM PGY-2, [place]

## 2016-05-08 NOTE — Patient Instructions (Signed)
Referral to Sports medicine placed. They will contact you  Ankle Pain Ankle pain is a common symptom. The bones, cartilage, tendons, and muscles of the ankle joint perform a lot of work each day. The ankle joint holds your body weight and allows you to move around. Ankle pain can occur on either side or back of 1 or both ankles. Ankle pain may be sharp and burning or dull and aching. There may be tenderness, stiffness, redness, or warmth around the ankle. The pain occurs more often when a person walks or puts pressure on the ankle. CAUSES  There are many reasons ankle pain can develop. It is important to work with your caregiver to identify the cause since many conditions can impact the bones, cartilage, muscles, and tendons. Causes for ankle pain include:  Injury, including a break (fracture), sprain, or strain often due to a fall, sports, or a high-impact activity.  Swelling (inflammation) of a tendon (tendonitis).  Achilles tendon rupture.  Ankle instability after repeated sprains and strains.  Poor foot alignment.  Pressure on a nerve (tarsal tunnel syndrome).  Arthritis in the ankle or the lining of the ankle.  Crystal formation in the ankle (gout or pseudogout). DIAGNOSIS  A diagnosis is based on your medical history, your symptoms, results of your physical exam, and results of diagnostic tests. Diagnostic tests may include X-ray exams or a computerized magnetic scan (magnetic resonance imaging, MRI). TREATMENT  Treatment will depend on the cause of your ankle pain and may include:  Keeping pressure off the ankle and limiting activities.  Using crutches or other walking support (a cane or brace).  Using rest, ice, compression, and elevation.  Participating in physical therapy or home exercises.  Wearing shoe inserts or special shoes.  Losing weight.  Taking medications to reduce pain or swelling or receiving an injection.  Undergoing surgery. HOME CARE INSTRUCTIONS    Only take over-the-counter or prescription medicines for pain, discomfort, or fever as directed by your caregiver.  Put ice on the injured area.  Put ice in a plastic bag.  Place a towel between your skin and the bag.  Leave the ice on for 15-20 minutes at a time, 03-04 times a day.  Keep your leg raised (elevated) when possible to lessen swelling.  Avoid activities that cause ankle pain.  Follow specific exercises as directed by your caregiver.  Record how often you have ankle pain, the location of the pain, and what it feels like. This information may be helpful to you and your caregiver.  Ask your caregiver about returning to work or sports and whether you should drive.  Follow up with your caregiver for further examination, therapy, or testing as directed. SEEK MEDICAL CARE IF:   Pain or swelling continues or worsens beyond 1 week.  You have an oral temperature above 102 F (38.9 C).  You are feeling unwell or have chills.  You are having an increasingly difficult time with walking.  You have loss of sensation or other new symptoms.  You have questions or concerns. MAKE SURE YOU:   Understand these instructions.  Will watch your condition.  Will get help right away if you are not doing well or get worse.   This information is not intended to replace advice given to you by your health care provider. Make sure you discuss any questions you have with your health care provider.   Document Released: 06/05/2010 Document Revised: 03/09/2012 Document Reviewed: 07/18/2015 [employer] Interactive Patient Education 2016 [employer]  

## 2016-05-09 ENCOUNTER — Encounter: Payer: Self-pay | Admitting: Obstetrics and Gynecology

## 2016-05-09 LAB — HEPATITIS C ANTIBODY: HCV Ab: NEGATIVE

## 2016-05-09 LAB — HIV ANTIBODY (ROUTINE TESTING W REFLEX): HIV: NONREACTIVE

## 2016-05-21 ENCOUNTER — Ambulatory Visit: Payer: Commercial Managed Care - HMO | Admitting: *Deleted

## 2016-05-22 ENCOUNTER — Ambulatory Visit: Payer: Commercial Managed Care - HMO | Admitting: Sports Medicine

## 2016-06-04 ENCOUNTER — Ambulatory Visit (INDEPENDENT_AMBULATORY_CARE_PROVIDER_SITE_OTHER): Payer: Commercial Managed Care - HMO | Admitting: Family Medicine

## 2016-06-04 ENCOUNTER — Ambulatory Visit (INDEPENDENT_AMBULATORY_CARE_PROVIDER_SITE_OTHER): Payer: Commercial Managed Care - HMO | Admitting: *Deleted

## 2016-06-04 ENCOUNTER — Ambulatory Visit (HOSPITAL_COMMUNITY)
Admission: RE | Admit: 2016-06-04 | Discharge: 2016-06-04 | Disposition: A | Discharge status: Home / Self Care | Payer: Commercial Managed Care - HMO | Source: Ambulatory Visit | Attending: Family Medicine | Admitting: Family Medicine

## 2016-06-04 ENCOUNTER — Encounter: Payer: Self-pay | Admitting: *Deleted

## 2016-06-04 VITALS — BP 154/70 | HR 75 | Temp 97.8°F | Ht 64.0 in | Wt 200.4 lb

## 2016-06-04 DIAGNOSIS — I208 Other forms of angina pectoris: Secondary | ICD-10-CM | POA: Diagnosis not present

## 2016-06-04 DIAGNOSIS — R9431 Abnormal electrocardiogram [ECG] [EKG]: Secondary | ICD-10-CM | POA: Diagnosis not present

## 2016-06-04 DIAGNOSIS — Z0000 Encounter for general adult medical examination without abnormal findings: Secondary | ICD-10-CM | POA: Insufficient documentation

## 2016-06-04 MED ORDER — METOPROLOL TARTRATE 25 MG PO TABS
12.5000 mg | ORAL_TABLET | Freq: Two times a day (BID) | ORAL | Status: DC
Start: 2016-06-04 — End: 2018-02-05

## 2016-06-04 MED ORDER — ASPIRIN EC 81 MG PO TBEC: 81.0000 mg | DELAYED_RELEASE_TABLET | Freq: Every day | ORAL | Status: DC

## 2016-06-04 MED ORDER — NITROGLYCERIN 0.4 MG SL SUBL: 0.4000 mg | SUBLINGUAL_TABLET | SUBLINGUAL | Status: DC | PRN

## 2016-06-04 NOTE — Patient Instructions (Signed)
Fat and Cholesterol Restricted Diet Getting too much fat and cholesterol in your diet may cause health problems. Following this diet helps keep your fat and cholesterol at normal levels. This can keep you from getting sick. WHAT TYPES OF FAT SHOULD I CHOOSE?  Choose monosaturated and polyunsaturated fats. These are found in foods such as olive oil, canola oil, flaxseeds, walnuts, almonds, and seeds.  Eat more omega-3 fats. Good choices include salmon, mackerel, sardines, tuna, flaxseed oil, and ground flaxseeds.  Limit saturated fats. These are in animal products such as meats, butter, and cream. They can also be in plant products such as palm oil, palm kernel oil, and coconut oil.   Avoid foods with partially hydrogenated oils in them. These contain trans fats. Examples of foods that have trans fats are stick margarine, some tub margarines, cookies, crackers, and other baked goods. WHAT GENERAL GUIDELINES DO I NEED TO FOLLOW?   Check food labels. Look for the words "trans fat" and "saturated fat."  When preparing a meal:  Fill half of your plate with vegetables and green salads.  Fill one fourth of your plate with whole grains. Look for the word "whole" as the first word in the ingredient list.  Fill one fourth of your plate with lean protein foods.  Limit fruit to two servings a day. Choose fruit instead of juice.  Eat more foods with soluble fiber. Examples of foods with this type of fiber are apples, broccoli, carrots, beans, peas, and barley. Try to get 20-30 g (grams) of fiber per day.  Eat more home-cooked foods. Eat less at restaurants and buffets.  Limit or avoid alcohol.  Limit foods high in starch and sugar.  Limit fried foods.  Cook foods without frying them. Baking, boiling, grilling, and broiling are all great options.  Lose weight if you are overweight. Losing even a small amount of weight can help your overall health. It can also help prevent diseases such as  diabetes and heart disease. WHAT FOODS CAN I EAT? Grains Whole grains, such as whole wheat or whole grain breads, crackers, cereals, and pasta. Unsweetened oatmeal, bulgur, barley, quinoa, or brown rice. Corn or whole wheat flour tortillas. Vegetables Fresh or frozen vegetables (raw, steamed, roasted, or grilled). Green salads. Fruits All fresh, canned (in natural juice), or frozen fruits. Meat and Other Protein Products Ground beef (85% or leaner), grass-fed beef, or beef trimmed of fat. Skinless chicken or [place]. Ground chicken or [place]. Pork trimmed of fat. All fish and seafood. Eggs. Dried beans, peas, or lentils. Unsalted nuts or seeds. Unsalted canned or dry beans. Dairy Low-fat dairy products, such as skim or 1% milk, 2% or reduced-fat cheeses, low-fat ricotta or cottage cheese, or plain low-fat yogurt. Fats and Oils Tub margarines without trans fats. Light or reduced-fat mayonnaise and salad dressings. Avocado. Olive, canola, sesame, or safflower oils. Natural peanut or almond butter (choose ones without added sugar and oil). The items listed above may not be a complete list of recommended foods or beverages. Contact your dietitian for more options. WHAT FOODS ARE NOT RECOMMENDED? Grains White bread. White pasta. White rice. Cornbread. Bagels, pastries, and croissants. Crackers that contain trans fat. Vegetables White potatoes. Corn. Creamed or fried vegetables. Vegetables in a cheese sauce. Fruits Dried fruits. Canned fruit in light or heavy syrup. Fruit juice. Meat and Other Protein Products Fatty cuts of meat. Ribs, chicken wings, bacon, sausage, bologna, salami, chitterlings, fatback, hot dogs, bratwurst, and packaged luncheon meats. Liver and organ meats. Dairy  Whole or 2% milk, cream, half-and-half, and cream cheese. Whole milk cheeses. Whole-fat or sweetened yogurt. Full-fat cheeses. Nondairy creamers and whipped toppings. Processed cheese, cheese spreads, or cheese  curds. Sweets and Desserts Corn syrup, sugars, honey, and molasses. Candy. Jam and jelly. Syrup. Sweetened cereals. Cookies, pies, cakes, donuts, muffins, and ice cream. Fats and Oils Butter, stick margarine, lard, shortening, ghee, or bacon fat. Coconut, palm kernel, or palm oils. Beverages Alcohol. Sweetened drinks (such as sodas, lemonade, and fruit drinks or punches). The items listed above may not be a complete list of foods and beverages to avoid. Contact your dietitian for more information.   This information is not intended to replace advice given to you by your health care provider. Make sure you discuss any questions you have with your health care provider.   Document Released: 06/16/2012 Document Revised: 01/06/2015 Document Reviewed: 03/17/2014 Elsevier Interactive Patient Education 2016 [place] in the Home  Falls can cause injuries. They can happen to people of all ages. There are many things you can do to make your home safe and to help prevent falls.  WHAT CAN I DO ON THE OUTSIDE OF MY HOME?  Regularly fix the edges of walkways and driveways and fix any cracks.  Remove anything that might make you trip as you walk through a door, such as a raised step or threshold.  Trim any bushes or trees on the path to your home.  Use bright outdoor lighting.  Clear any walking paths of anything that might make someone trip, such as rocks or tools.  Regularly check to see if handrails are loose or broken. Make sure that both sides of any steps have handrails.  Any raised decks and porches should have guardrails on the edges.  Have any leaves, snow, or ice cleared regularly.  Use sand or salt on walking paths during winter.  Clean up any spills in your garage right away. This includes oil or grease spills. WHAT CAN I DO IN THE BATHROOM?   Use night lights.  Install grab bars by the toilet and in the tub and shower. Do not use towel bars as grab  bars.  Use non-skid mats or decals in the tub or shower.  If you need to sit down in the shower, use a plastic, non-slip stool.  Keep the floor dry. Clean up any water that spills on the floor as soon as it happens.  Remove soap buildup in the tub or shower regularly.  Attach bath mats securely with double-sided non-slip rug tape.  Do not have throw rugs and other things on the floor that can make you trip. WHAT CAN I DO IN THE BEDROOM?  Use night lights.  Make sure that you have a light by your bed that is easy to reach.  Do not use any sheets or blankets that are too big for your bed. They should not hang down onto the floor.  Have a firm chair that has side arms. You can use this for support while you get dressed.  Do not have throw rugs and other things on the floor that can make you trip. WHAT CAN I DO IN THE KITCHEN?  Clean up any spills right away.  Avoid walking on wet floors.  Keep items that you use a lot in easy-to-reach places.  If you need to reach something above you, use a strong step stool that has a grab bar.  Keep electrical cords out of the way.  Do not use floor polish or wax that makes floors slippery. If you must use wax, use non-skid floor wax.  Do not have throw rugs and other things on the floor that can make you trip. WHAT CAN I DO WITH MY STAIRS?  Do not leave any items on the stairs.  Make sure that there are handrails on both sides of the stairs and use them. Fix handrails that are broken or loose. Make sure that handrails are as long as the stairways.  Check any carpeting to make sure that it is firmly attached to the stairs. Fix any carpet that is loose or worn.  Avoid having throw rugs at the top or bottom of the stairs. If you do have throw rugs, attach them to the floor with carpet tape.  Make sure that you have a light switch at the top of the stairs and the bottom of the stairs. If you do not have them, ask someone to add them for  you. WHAT ELSE CAN I DO TO HELP PREVENT FALLS?  Wear shoes that:  Do not have high heels.  Have rubber bottoms.  Are comfortable and fit you well.  Are closed at the toe. Do not wear sandals.  If you use a stepladder:  Make sure that it is fully opened. Do not climb a closed stepladder.  Make sure that both sides of the stepladder are locked into place.  Ask someone to hold it for you, if possible.  Clearly mark and make sure that you can see:  Any grab bars or handrails.  First and last steps.  Where the edge of each step is.  Use tools that help you move around (mobility aids) if they are needed. These include:  Canes.  Walkers.  Scooters.  Crutches.  Turn on the lights when you go into a dark area. Replace any light bulbs as soon as they burn out.  Set up your furniture so you have a clear path. Avoid moving your furniture around.  If any of your floors are uneven, fix them.  If there are any pets around you, be aware of where they are.  Review your medicines with your doctor. Some medicines can make you feel dizzy. This can increase your chance of falling. Ask your doctor what other things that you can do to help prevent falls.   This information is not intended to replace advice given to you by your health care provider. Make sure you discuss any questions you have with your health care provider.   Document Released: 10/12/2009 Document Revised: 05/02/2015 Document Reviewed: 01/20/2015 Elsevier Interactive Patient Education 2016 [place] Maintenance, Female Adopting a healthy lifestyle and getting preventive care can go a long way to promote health and wellness. Talk with your health care provider about what schedule of regular examinations is right for you. This is a good chance for you to check in with your provider about disease prevention and staying healthy. In between checkups, there are plenty of things you can do on your own. Experts  have done a lot of research about which lifestyle changes and preventive measures are most likely to keep you healthy. Ask your health care provider for more information. WEIGHT AND DIET  Eat a healthy diet  Be sure to include plenty of vegetables, fruits, low-fat dairy products, and lean protein.  Do not eat a lot of foods high in solid fats, added sugars, or salt.  Get regular exercise. This is one  of the most important things you can do for your health.  Most adults should exercise for at least 150 minutes each week. The exercise should increase your heart rate and make you sweat (moderate-intensity exercise).  Most adults should also do strengthening exercises at least twice a week. This is in addition to the moderate-intensity exercise.  Maintain a healthy weight  Body mass index (BMI) is a measurement that can be used to identify possible weight problems. It estimates body fat based on height and weight. Your health care provider can help determine your BMI and help you achieve or maintain a healthy weight.  For females 21 years of age and older:   A BMI below 18.5 is considered underweight.  A BMI of 18.5 to 24.9 is normal.  A BMI of 25 to 29.9 is considered overweight.  A BMI of 30 and above is considered obese.  Watch levels of cholesterol and blood lipids  You should start having your blood tested for lipids and cholesterol at 57 years of age, then have this test every 5 years.  You may need to have your cholesterol levels checked more often if:  Your lipid or cholesterol levels are high.  You are older than 57 years of age.  You are at high risk for heart disease.  CANCER SCREENING   Lung Cancer  Lung cancer screening is recommended for adults 53-97 years old who are at high risk for lung cancer because of a history of smoking.  A yearly low-dose CT scan of the lungs is recommended for people who:  Currently smoke.  Have quit within the past 15  years.  Have at least a 30-pack-year history of smoking. A pack year is smoking an average of one pack of cigarettes a day for 1 year.  Yearly screening should continue until it has been 15 years since you quit.  Yearly screening should stop if you develop a health problem that would prevent you from having lung cancer treatment.  Breast Cancer  Practice breast self-awareness. This means understanding how your breasts normally appear and feel.  It also means doing regular breast self-exams. Let your health care provider know about any changes, no matter how small.  If you are in your 20s or 30s, you should have a clinical breast exam (CBE) by a health care provider every 1-3 years as part of a regular health exam.  If you are 10 or older, have a CBE every year. Also consider having a breast X-ray (mammogram) every year.  If you have a family history of breast cancer, talk to your health care provider about genetic screening.  If you are at high risk for breast cancer, talk to your health care provider about having an MRI and a mammogram every year.  Breast cancer gene (BRCA) assessment is recommended for women who have family members with BRCA-related cancers. BRCA-related cancers include:  Breast.  Ovarian.  Tubal.  Peritoneal cancers.  Results of the assessment will determine the need for genetic counseling and BRCA1 and BRCA2 testing. Cervical Cancer Your health care provider may recommend that you be screened regularly for cancer of the pelvic organs (ovaries, uterus, and vagina). This screening involves a pelvic examination, including checking for microscopic changes to the surface of your cervix (Pap test). You may be encouraged to have this screening done every 3 years, beginning at age 29.  For women ages 10-65, health care providers may recommend pelvic exams and Pap testing every 3 years, or  they may recommend the Pap and pelvic exam, combined with testing for human  papilloma virus (HPV), every 5 years. Some types of HPV increase your risk of cervical cancer. Testing for HPV may also be done on women of any age with unclear Pap test results.  Other health care providers may not recommend any screening for nonpregnant women who are considered low risk for pelvic cancer and who do not have symptoms. Ask your health care provider if a screening pelvic exam is right for you.  If you have had past treatment for cervical cancer or a condition that could lead to cancer, you need Pap tests and screening for cancer for at least 20 years after your treatment. If Pap tests have been discontinued, your risk factors (such as having a new sexual partner) need to be reassessed to determine if screening should resume. Some women have medical problems that increase the chance of getting cervical cancer. In these cases, your health care provider may recommend more frequent screening and Pap tests. Colorectal Cancer  This type of cancer can be detected and often prevented.  Routine colorectal cancer screening usually begins at 57 years of age and continues through 57 years of age.  Your health care provider may recommend screening at an earlier age if you have risk factors for colon cancer.  Your health care provider may also recommend using home test kits to check for hidden blood in the stool.  A small camera at the end of a tube can be used to examine your colon directly (sigmoidoscopy or colonoscopy). This is done to check for the earliest forms of colorectal cancer.  Routine screening usually begins at age 28.  Direct examination of the colon should be repeated every 5-10 years through 57 years of age. However, you may need to be screened more often if early forms of precancerous polyps or small growths are found. Skin Cancer  Check your skin from head to toe regularly.  Tell your health care provider about any new moles or changes in moles, especially if there is a  change in a mole's shape or color.  Also tell your health care provider if you have a mole that is larger than the size of a pencil eraser.  Always use sunscreen. Apply sunscreen liberally and repeatedly throughout the day.  Protect yourself by wearing long sleeves, pants, a wide-brimmed hat, and sunglasses whenever you are outside. HEART DISEASE, DIABETES, AND HIGH BLOOD PRESSURE   High blood pressure causes heart disease and increases the risk of stroke. High blood pressure is more likely to develop in:  People who have blood pressure in the high end of the normal range (130-139/85-89 mm Hg).  People who are overweight or obese.  People who are African American.  If you are 73-39 years of age, have your blood pressure checked every 3-5 years. If you are 36 years of age or older, have your blood pressure checked every year. You should have your blood pressure measured twice--once when you are at a hospital or clinic, and once when you are not at a hospital or clinic. Record the average of the two measurements. To check your blood pressure when you are not at a hospital or clinic, you can use:  An automated blood pressure machine at a pharmacy.  A home blood pressure monitor.  If you are between 63 years and 52 years old, ask your health care provider if you should take aspirin to prevent strokes.  Have regular  diabetes screenings. This involves taking a blood sample to check your fasting blood sugar level.  If you are at a normal weight and have a low risk for diabetes, have this test once every three years after 57 years of age.  If you are overweight and have a high risk for diabetes, consider being tested at a younger age or more often. PREVENTING INFECTION  Hepatitis B  If you have a higher risk for hepatitis B, you should be screened for this virus. You are considered at high risk for hepatitis B if:  You were born in a country where hepatitis B is common. Ask your health  care provider which countries are considered high risk.  Your parents were born in a high-risk country, and you have not been immunized against hepatitis B (hepatitis B vaccine).  You have HIV or AIDS.  You use needles to inject street drugs.  You live with someone who has hepatitis B.  You have had sex with someone who has hepatitis B.  You get hemodialysis treatment.  You take certain medicines for conditions, including cancer, organ transplantation, and autoimmune conditions. Hepatitis C  Blood testing is recommended for:  Everyone born from [birth year] through 1967.  Anyone with known risk factors for hepatitis C. Sexually transmitted infections (STIs)  You should be screened for sexually transmitted infections (STIs) including gonorrhea and chlamydia if:  You are sexually active and are younger than 57 years of age.  You are older than 57 years of age and your health care provider tells you that you are at risk for this type of infection.  Your sexual activity has changed since you were last screened and you are at an increased risk for chlamydia or gonorrhea. Ask your health care provider if you are at risk.  If you do not have HIV, but are at risk, it may be recommended that you take a prescription medicine daily to prevent HIV infection. This is called pre-exposure prophylaxis (PrEP). You are considered at risk if:  You are sexually active and do not regularly use condoms or know the HIV status of your partner(s).  You take drugs by injection.  You are sexually active with a partner who has HIV. Talk with your health care provider about whether you are at high risk of being infected with HIV. If you choose to begin PrEP, you should first be tested for HIV. You should then be tested every 3 months for as long as you are taking PrEP.  PREGNANCY   If you are premenopausal and you may become pregnant, ask your health care provider about preconception counseling.  If you may  become pregnant, take 400 to 800 micrograms (mcg) of folic acid every day.  If you want to prevent pregnancy, talk to your health care provider about birth control (contraception). OSTEOPOROSIS AND MENOPAUSE   Osteoporosis is a disease in which the bones lose minerals and strength with aging. This can result in serious bone fractures. Your risk for osteoporosis can be identified using a bone density scan.  If you are 75 years of age or older, or if you are at risk for osteoporosis and fractures, ask your health care provider if you should be screened.  Ask your health care provider whether you should take a calcium or vitamin D supplement to lower your risk for osteoporosis.  Menopause may have certain physical symptoms and risks.  Hormone replacement therapy may reduce some of these symptoms and risks. Talk to your health  care provider about whether hormone replacement therapy is right for you.  HOME CARE INSTRUCTIONS   Schedule regular health, dental, and eye exams.  Stay current with your immunizations.   Do not use any tobacco products including cigarettes, chewing tobacco, or electronic cigarettes.  If you are pregnant, do not drink alcohol.  If you are breastfeeding, limit how much and how often you drink alcohol.  Limit alcohol intake to no more than 1 drink per day for nonpregnant women. One drink equals 12 ounces of beer, 5 ounces of wine, or 1 ounces of hard liquor.  Do not use street drugs.  Do not share needles.  Ask your health care provider for help if you need support or information about quitting drugs.  Tell your health care provider if you often feel depressed.  Tell your health care provider if you have ever been abused or do not feel safe at home.   This information is not intended to replace advice given to you by your health care provider. Make sure you discuss any questions you have with your health care provider.   Document Released: 07/01/2011  Document Revised: 01/06/2015 Document Reviewed: 11/17/2013 Elsevier Interactive Patient Education [employer].

## 2016-06-04 NOTE — Progress Notes (Signed)
Subjective:   Mariah Jackson is a 55 y.o. female who presents for an Initial Medicare Annual Wellness Visit.      Objective:    Today's Vitals   06/04/16 0856 06/04/16 1010  BP: 156/85 154/70  Pulse: 75   Temp: 97.8 F (36.6 C)   TempSrc: Oral   Height: 5\' 4"  (1.626 m)   Weight: 200 lb 6.4 oz (90.901 kg)   SpO2: 98%   PainSc: 7     Body mass index is 34.38 kg/(m^2).   Current Medications (verified) Outpatient Encounter Prescriptions as of 06/04/2016  Medication Sig  . amLODipine (NORVASC) 10 MG tablet TAKE 1 TABLET EVERY DAY  . dalfampridine (AMPYRA) 10 MG TB12 Take 1 tablet (10 mg total) by mouth every 12 (twelve) hours.  . Dimethyl Fumarate (TECFIDERA) 240 MG CPDR Take 1 capsule (240 mg total) by mouth 2 (two) times daily.  Marland Kitchen ibuprofen (ADVIL,MOTRIN) 600 MG tablet TAKE 1 TABLET EVERY 8 HOURS AS NEEDED  . levothyroxine (SYNTHROID, LEVOTHROID) 100 MCG tablet TAKE 1 TABLET DAILY BEFORE BREAKFAST.  . Multiple Vitamin (MULTIVITAMIN) tablet Take 1 tablet by mouth daily.  . rosuvastatin (CRESTOR) 10 MG tablet TAKE 1 TABLET EVERY DAY  . diclofenac sodium (VOLTAREN) 1 % GEL Apply 4 g topically 4 (four) times daily. (Patient not taking: Reported on 06/04/2016)   No facility-administered encounter medications on file as of 06/04/2016.    Allergies (verified) Vicodin   History: Past Medical History  Diagnosis Date  . Multiple sclerosis ([place])   . Hypothyroidism   . Hypertension   . Hyperlipidemia   . Breast cancer [facility]) 2007    left   Past Surgical History  Procedure Laterality Date  . Tubal ligation  1985  . Breast lumpectomy  2007    left   Family History  Problem Relation Age of Onset  . Colon cancer Neg Hx   . Heart attack Father   . Stroke Father   . Hypertension Mother   . Hypertension Sister   . Hypertension Brother   . Diabetes Brother 70    Prediabetic  . Hypertension Brother   . Hypertension Sister    Social History   Occupational History  .  Unemployed    . Disabiliyt    Social History Main Topics  . Smoking status: Never Smoker   . Smokeless tobacco: Never Used  . Alcohol Use: No  . Drug Use: No  . Sexual Activity: No    Tobacco Counseling Counseling given: No   Activities of Daily Living In your present state of health, do you have any difficulty performing the following activities: 06/04/2016  Hearing? N  Vision? N  Difficulty concentrating or making decisions? N  Walking or climbing stairs? Y  Dressing or bathing? N  Doing errands, shopping? N   Home Safety:  My home has a working smoke alarm:  Yes           My home throw rugs have been fastened down to the floor or removed:  Yes I have non-slip mats in the bathtub and shower:  Yes, also uses bath chair         All my home's stairs have railings or bannisters: Yes, also, back door with extended wheelchair ramp         My home's floors, stairs and hallways are free from clutter, wires and cords:  Yes       Immunizations and Health Maintenance Immunization History  Administered Date(s) Administered  .  Influenza,inj,Quad PF,36+ Mos 11/07/2014  . Tdap 05/08/2016   Health Maintenance Due  Topic Date Due  . PAP SMEAR  01/22/1980  Patient to receive pap smear at next office visit  Patient Care Team: Aquilla Hacker, MD as PCP - General (Family Medicine) Webb Laws, [place] as Referring Physician (Optometry) Thurman Coyer, DO as Consulting Physician (Sports Medicine) Marcial Pacas, MD as Consulting Physician (Neurology)  Indicate any recent Medical Services you may have received from other than [facility] providers in the past year (date may be approximate).     Assessment:   This is a routine wellness examination for [facility].   Hearing/Vision screen  Hearing Screening   125Hz  250Hz  500Hz  1000Hz  2000Hz  4000Hz  8000Hz   Right ear:   40 25 25 25    Left ear:   25 25 25 25      Dietary issues and exercise activities discussed: Current Exercise Habits: Home  exercise routine, Type of exercise: walking;strength training/weights, Time (Minutes): 20, Frequency (Times/Week): 5, Weekly Exercise (Minutes/Week): 100, Intensity: Mild, Exercise limited by: neurologic condition(s);orthopedic condition(s) (MS and hurt right ankle)  Patient states that she sometimes experiences non-radiating, left-sided chest pressure while walking on ramp for exercise. States pressure is relieved with rest.  Goals    . Blood Pressure < 140/90          . Exercise 5x per week (15 min per time)     Walks up and down outdoor ramp    . LDL CALC < 100    . Weight < 186 lb (84.369 kg)     7% weight loss      Depression Screen PHQ 2/9 Scores 06/04/2016 05/08/2016 07/06/2015 04/07/2015  PHQ - 2 Score 1 0 0 0    Fall Risk Fall Risk  06/04/2016 05/08/2016 07/06/2015 04/07/2015  Falls in the past year? No No Yes Yes  Number falls in past yr: - - 1 1  Risk for fall due to : Impaired balance/gait;Impaired mobility - - -  Risk for fall due to (comments): Patient with MS - - -    Cognitive Function: Mini-Cog Failed with score 2/5  TUG Test:  Done in 12 seconds. Patient used both hands to push out of chair and to sit back down. Walks with cane. Has ankle brace on right ankle.  Screening Tests Health Maintenance  Topic Date Due  . PAP SMEAR  01/22/1980  . INFLUENZA VACCINE  07/30/2016  . MAMMOGRAM  09/17/2017  . COLONOSCOPY  05/07/2018  . TETANUS/TDAP  05/08/2026  . Hepatitis C Screening  Completed  . HIV Screening  Completed      Plan:    During the course of the visit, Johnita was educated and counseled about the following appropriate screening and preventive services:   Vaccines to include Pneumoccal, Influenza, Td, Zostavax   Electrocardiogram  Colorectal cancer screening  Bone density screening  Diabetes screening  Mammography/PAP  Nutrition counseling  Smoking cessation counseling  Patient Instructions (the written plan) were given to the patient.   After  completing visit patient c/o left-sided chest pressure (not pain) that she rated 7-8/10. Lasted approximately 10 seconds. Pain radiated around to left side of back and down left arm to elbow. Discussed with preceptors, Drs.  Chambliss and Hensel, and patient placed on PCP's (Dr. Minda Ditto) schedule for same day visit.    Velora Heckler, RN   06/04/2016

## 2016-06-04 NOTE — Progress Notes (Signed)
   After completing visit patient c/o left-sided chest pressure (not pain) that she rated 7-8/10. Lasted approximately 10 seconds. Pain radiated around to left side of back and down left arm to elbow. Discussed with preceptors, Drs.  Chambliss and Hensel, and patient placed on PCP's (Dr. Minda Ditto) schedule for same day visit. Velora Heckler, RN

## 2016-06-04 NOTE — Progress Notes (Signed)
Patient ID: Mariah Jackson, female   DOB: March 30, 1961, 55 y.o.   MRN: [id number]   [facility] Aquilla Hacker, MD Phone: [phone]  Subjective:   # Chest Pain - was here for wellness visit this morning, had a "twinge" in her arm.  - blood pressure was checked.  - She has been having some chest pain on the left lateral side of her chest radiating to the back and into the proximal part of her arm at times. She now says this has been going on for some time.  - some "strain" feeling going into her arm.  - No diaphoresis, no nausea, no neck pain or jaw pain.  - pain is a "dull pain" in her chest. Described as a pressure.  - She says it specifically feels like a "pressure" whenever she exerts herself. Resting helps this to go away. - She will have SOB, and will have diaphoresis whenever she reaches the point of exertion. She rests, this will go away, and then she will reexert herself and these symptoms return.   - 5 x on the ramp at her house elicits these symptoms.  - she says she will take ibuprofen and this helps, but the pain returns again when she exerts herself.  - She does exercise with seated chest butterfly machine. She does this every morning and have been doing this for a month, she has been a little "sore" from this recently.  - the above symptoms have been ongoing for about 6-7 months.  - Has never had a heart cath or stress test.  - BP is borderline controlled.  - Norvasc for BP. She has MS as well.  - Synthroid for thyroid.  - Dad had heart attack - 64's, Mom has no heart issues, siblings without issues as well.  - No Tobacco, does not drink ETOH.   All relevant systems were reviewed and were negative unless otherwise noted in the HPI  Past Medical History Reviewed problem list.  Medications- reviewed and updated Current Outpatient Prescriptions  Medication Sig Dispense Refill  . amLODipine (NORVASC) 10 MG tablet TAKE 1 TABLET EVERY DAY 90 tablet 4    . dalfampridine (AMPYRA) 10 MG TB12 Take 1 tablet (10 mg total) by mouth every 12 (twelve) hours. 180 tablet 3  . diclofenac sodium (VOLTAREN) 1 % GEL Apply 4 g topically 4 (four) times daily. (Patient not taking: Reported on 06/04/2016) 100 g 6  . Dimethyl Fumarate (TECFIDERA) 240 MG CPDR Take 1 capsule (240 mg total) by mouth 2 (two) times daily. 180 capsule 2  . ibuprofen (ADVIL,MOTRIN) 600 MG tablet TAKE 1 TABLET EVERY 8 HOURS AS NEEDED 30 tablet 0  . levothyroxine (SYNTHROID, LEVOTHROID) 100 MCG tablet TAKE 1 TABLET DAILY BEFORE BREAKFAST. 90 tablet 0  . Multiple Vitamin (MULTIVITAMIN) tablet Take 1 tablet by mouth daily.    . rosuvastatin (CRESTOR) 10 MG tablet TAKE 1 TABLET EVERY DAY 90 tablet 1   No current facility-administered medications for this visit.   Chief complaint-noted No additions to family history Social history- patient is a non smoker  Objective: There were no vitals taken for this visit. Gen: NAD, alert, cooperative with exam HEENT: NCAT, EOMI, PERRL Neck: FROM, supple CV: RRR, good S1/S2, no murmur Resp: CTABL, no wheezes, non-labored Abd: SNTND, BS present, no guarding or organomegaly Ext: No edema, warm, normal tone, moves UE/LE spontaneously Neuro: Alert and oriented, No gross deficits Skin: no rashes no lesions  EKG - Flatened TW  V2,3, AVF, Inverted TW III, QTC 569.   Assessment/Plan:  # Stable Angina - some typical / atypical symptoms. Concern for CAD. T wave flattening and inversion of lead IIII.  - Start ASA 81mg  daily.  - Continue the Crestor  - Start metoprolol given borderline BP's and possible CAD.  - referral to cardiology for stress test  - s/s of MI reviewed return precautions reviewed.  - will rx nitro SL tabs - No QT prolonging medicines. Will repeat EKG in the future to monitor.

## 2016-06-04 NOTE — Patient Instructions (Signed)
Thanks for coming in today.   We have started you on Aspirin daily.   Metoprolol for your heart and blood pressure 1/2 of a tablet in the morning and at night.   You will be called with your cardiology appointment.   Use the nitroglycerin in the event that you have chest pain.   Call Ambulance if you have chest pain, shortness of breath, sweating, nausea, or arm pain that is not going away, or if you are concerned that you are having a heart attack.   Follow up with [place] in 1-2 months to ensure that your symptoms are stable, and to follow up on Cardiology referral.   Thanks for letting us take care of you.   Sincerely, Paula Compton, MD Family Medicine -PGY 2

## 2016-06-05 ENCOUNTER — Other Ambulatory Visit: Payer: Self-pay | Admitting: Family Medicine

## 2016-06-10 ENCOUNTER — Encounter: Payer: Self-pay | Admitting: Sports Medicine

## 2016-06-10 ENCOUNTER — Ambulatory Visit (INDEPENDENT_AMBULATORY_CARE_PROVIDER_SITE_OTHER): Payer: Commercial Managed Care - HMO | Admitting: Sports Medicine

## 2016-06-10 VITALS — BP 153/84 | HR 84 | Ht 64.0 in | Wt 202.0 lb

## 2016-06-10 DIAGNOSIS — M79671 Pain in right foot: Secondary | ICD-10-CM | POA: Diagnosis not present

## 2016-06-10 DIAGNOSIS — M6789 Other specified disorders of synovium and tendon, multiple sites: Secondary | ICD-10-CM | POA: Diagnosis not present

## 2016-06-10 DIAGNOSIS — M76829 Posterior tibial tendinitis, unspecified leg: Secondary | ICD-10-CM

## 2016-06-10 NOTE — Progress Notes (Signed)
Patient ID: Mariah Jackson, female   DOB: 07/07/1961, 55 y.o.   MRN: [id number]  CC: R ankle "turning out"  HPI: Mariah Jackson is a 55 yo female with PMH of MS, R ankle OA, hypothyroidism presenting with several month history of R ankle "turning out."  Pt states her first trouble with that ankle was in 2001 when she fell and badly injured it, subsequently diagnosed with MS causing decreased sensation in her feet leading to fall.  Diagnosed with OA in that ankle  three years ago was placed in ankle support stocking, walks with cain at baseline.  States for the past several months or perhaps longer started noticing her ankle turning out.  She does not notice increased pain over this time (well controlled on Ibuprofen), just change in position of her foot.  Still using support stocking and cain, has custom orthotics made years ago and they helped her pain some, has not been using them recently.  MS is well-controlled on medication.    ROS:  Gen: - fevers/chills MSK: - except as per HPI Neuro: + numbness/decreased sensation bilateral LEs  PE:  BP 153/84 mmHg  Pulse 84  Ht 5\' 4"  (1.626 m)  Wt 202 lb (91.627 kg)  BMI 34.66 kg/m2 Gen: 55 yo female in NAD sitting on exam table HEENT: normocephalic, atraumatic Pulm: normal work of breathing MSK:  RLE: bilateral pes planus, R calcaneal valgus, signs consistent with R posterior tibial tendon insufficiency ("too many toes" sign when viewed from posterior), full active ROM, non-TTP throughout, sensation grossly intact, talar tilt and anterior drawer sign negative for laxity. Good subtalar motion. 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 I agree with the above plan of care. Patient has pretty classic and rather severe posterior tibialis tendon insufficiency in the right foot. We will try her in a temporary insert for now and see her back in 3 weeks. She would benefit from custom orthotic at some point in time.

## 2016-06-11 ENCOUNTER — Ambulatory Visit: Payer: Commercial Managed Care - HMO | Admitting: Cardiovascular Disease

## 2016-06-14 ENCOUNTER — Encounter: Payer: Self-pay | Admitting: *Deleted

## 2016-06-14 NOTE — Progress Notes (Signed)
Patient ID: Mariah Jackson, female   DOB: [birth year], 55 y.o.   MRN: [id number] I have reviewed this visit and discussed with Howell Rucks, RN, BSN, and agree with her documentation.

## 2016-06-19 NOTE — Progress Notes (Signed)
Cardiology Office Note   Date:  06/20/2016   ID:  Mariah Jackson, DOB 1961/02/28, MRN [id number]  PCP:  Marina Goodell, MD  Cardiologist:   Minus Breeding, MD   Chief Complaint  Patient presents with  . New Evaluation    Stable angina.  . Dizziness  . Shortness of Breath  . Edema    RIGHT ANKLE      History of Present Illness: Mariah Jackson is a 55 y.o. female who presents for evaluation of chest pain.  She has no past cardiac history. This was a sharp discomfort that radiated around slightly to the axilla. It would come on sporadically. It felt like previous "gas" for reflux that she had had. She said it was mild. He would be a shooting discomfort. It would come on with rest. He could not make it happen although she is limited by multiple sclerosis. She did not have any associated symptoms such as nausea vomiting or diaphoresis. She didn't have any palpitations, presyncope or syncope. She's not had any PND or orthopnea. She did seem to notice some of this after she walks up the ramp that she uses at her house. She would do this for 5 times for exercise and developed some symptoms occasionally. However, these are completely gone away and she's not had any symptoms in many days.  Past Medical History  Diagnosis Date  . Multiple sclerosis ([place])   . Hypothyroidism   . Hypertension   . Hyperlipidemia   . Breast cancer [facility]) 2007    left    Past Surgical History  Procedure Laterality Date  . Tubal ligation  1985  . Breast lumpectomy  2007    left, radiation     Current Outpatient Prescriptions  Medication Sig Dispense Refill  . amLODipine (NORVASC) 10 MG tablet TAKE 1 TABLET EVERY DAY 90 tablet 4  . aspirin EC 81 MG tablet Take 1 tablet (81 mg total) by mouth daily. 90 tablet 3  . dalfampridine (AMPYRA) 10 MG TB12 Take 1 tablet (10 mg total) by mouth every 12 (twelve) hours. 180 tablet 3  . diclofenac sodium (VOLTAREN) 1 % GEL Apply 4 g topically 4 (four) times daily. 100 g 6    . Dimethyl Fumarate (TECFIDERA) 240 MG CPDR Take 1 capsule (240 mg total) by mouth 2 (two) times daily. 180 capsule 2  . ibuprofen (ADVIL,MOTRIN) 600 MG tablet TAKE 1 TABLET EVERY 8 HOURS AS NEEDED 30 tablet 0  . levothyroxine (SYNTHROID, LEVOTHROID) 100 MCG tablet TAKE 1 TABLET DAILY BEFORE BREAKFAST. 90 tablet 0  . metoprolol tartrate (LOPRESSOR) 25 MG tablet Take 0.5 tablets (12.5 mg total) by mouth 2 (two) times daily. 90 tablet 3  . Multiple Vitamin (MULTIVITAMIN) tablet Take 1 tablet by mouth daily.    . nitroGLYCERIN (NITROSTAT) 0.4 MG SL tablet Place 1 tablet (0.4 mg total) under the tongue every 5 (five) minutes as needed for chest pain. 50 tablet 3  . rosuvastatin (CRESTOR) 10 MG tablet TAKE 1 TABLET EVERY DAY 90 tablet 1   No current facility-administered medications for this visit.    Allergies:   Vicodin    Social History:  The patient  reports that she has never smoked. She has never used smokeless tobacco. She reports that she does not drink alcohol or use illicit drugs.   Family History:  The patient's family history includes Diabetes (age of onset: 48) in her brother; Hypertension in her brother, brother, mother, sister, and sister; Stroke in her father.  There is no history of Colon cancer.    ROS:  Please see the history of present illness.   Otherwise, review of systems are positive for neuropathy   All other systems are reviewed and negative.    PHYSICAL EXAM: VS:  BP 142/80 mmHg  Pulse 94  Ht 5\' 4"  (1.626 m)  Wt 200 lb (90.719 kg)  BMI 34.31 kg/m2 , BMI Body mass index is 34.31 kg/(m^2). PHYSICAL EXAM GEN:  No distress NECK:  No jugular venous distention at 90 degrees, waveform within normal limits, carotid upstroke brisk and symmetric, no bruits, no thyromegaly LYMPHATICS:  No cervical adenopathy LUNGS:  Clear to auscultation bilaterally BACK:  No CVA tenderness CHEST:  Unremarkable HEART:  S1 and S2 within normal limits, no S3, no S4, no clicks, no rubs, no  murmurs ABD:  Positive bowel sounds normal in frequency in pitch, no bruits, no rebound, no guarding, unable to assess midline mass or bruit with the patient seated. EXT:  2 plus pulses throughout, moderate edema, no cyanosis no clubbing SKIN:  No rashes no nodules NEURO:  Cranial nerves II through XII grossly intact, motor grossly intact throughout PSYCH:  Cognitively intact, oriented to person place and time   EKG:  EKG is not ordered today. The ekg ordered 06/04/16 demonstrates sinus rhythm, rate 89, axis within normal limits, intervals within normal limits, nonspecific T-wave flattening   Recent Labs: 09/19/2015: Platelets 247 03/18/2016: ALT 17; BUN 11; Creatinine, Ser 0.71; Potassium 4.5; Sodium 144    Lipid Panel No results found for: CHOL, TRIG, HDL, CHOLHDL, VLDL, LDLCALC, LDLDIRECT    Wt Readings from Last 3 Encounters:  06/20/16 200 lb (90.719 kg)  06/10/16 202 lb (91.627 kg)  06/04/16 200 lb 6.4 oz (90.901 kg)      Other studies Reviewed: Additional studies/ records that were reviewed today include: Office records. Review of the above records demonstrates:  Please see elsewhere in the note.     ASSESSMENT AND PLAN:  CHEST PAIN:  Her pain was atypical. She has no high-risk cardiovascular risk factors. Her symptoms have completely abated. Therefore, I don't think pretest probability justify screening with a stress perfusion study. She is instructed to call me should she have any further symptoms and I would reevaluate.  ABNORMAL EKG:  I do note a QT that was reported to be prolonged on her chest x-ray. However, this is overestimated diffuse nonspecific changes. No change in therapy or further evaluation is indicated.     Current medicines are reviewed at length with the patient today.  The patient does not have concerns regarding medicines.  The following changes have been made:  no change  Labs/ tests ordered today include: None  No orders of the defined types were  placed in this encounter.     Disposition:   FU with me as needed.      Signed, Minus Breeding, MD  06/20/2016 3:04 PM    [place]    Chest

## 2016-06-20 ENCOUNTER — Encounter: Payer: Self-pay | Admitting: Cardiology

## 2016-06-20 ENCOUNTER — Ambulatory Visit (INDEPENDENT_AMBULATORY_CARE_PROVIDER_SITE_OTHER): Payer: Commercial Managed Care - HMO | Admitting: Cardiology

## 2016-06-20 VITALS — BP 142/80 | HR 94 | Ht 64.0 in | Wt 200.0 lb

## 2016-06-20 DIAGNOSIS — R079 Chest pain, unspecified: Secondary | ICD-10-CM | POA: Diagnosis not present

## 2016-06-20 NOTE — Patient Instructions (Signed)
Your physician recommends that you schedule a follow-up appointment in: As Needed    

## 2016-07-08 ENCOUNTER — Other Ambulatory Visit: Payer: Self-pay | Admitting: *Deleted

## 2016-07-08 MED ORDER — DIMETHYL FUMARATE 240 MG PO CPDR: 240.0000 mg | DELAYED_RELEASE_CAPSULE | Freq: Two times a day (BID) | ORAL | Status: DC

## 2016-07-09 ENCOUNTER — Ambulatory Visit (INDEPENDENT_AMBULATORY_CARE_PROVIDER_SITE_OTHER): Payer: Commercial Managed Care - HMO | Admitting: Sports Medicine

## 2016-07-09 ENCOUNTER — Encounter: Payer: Self-pay | Admitting: Sports Medicine

## 2016-07-09 VITALS — BP 153/83 | HR 86 | Ht 64.0 in | Wt 200.0 lb

## 2016-07-09 DIAGNOSIS — M6789 Other specified disorders of synovium and tendon, multiple sites: Secondary | ICD-10-CM | POA: Diagnosis not present

## 2016-07-09 DIAGNOSIS — M76829 Posterior tibial tendinitis, unspecified leg: Secondary | ICD-10-CM

## 2016-07-09 NOTE — Progress Notes (Signed)
   Subjective:    Patient ID: Mariah Jackson, female    DOB: 1961/07/06, 55 y.o.   MRN: [id number]  HPI   Patient comes in today for custom orthotics. The green sports insoles and scaphoid pads were a little uncomfortable but it turns out that she was actually wearing them in her shoes without removing the prefabricated shoe insert. She has a history of severe posterior tibialis tendon insufficiency and pes planus. We had discussed custom orthotics at her office visit back on June 12. She would like to proceed with those today.    Review of Systems     Objective:   Physical Exam  Well-developed, well-nourished. No acute distress  Physical exam of her feet are basically unchanged from the exam on 06/10/2016. She has severe pes planus bilaterally, right greater than left. Difficulty with calcaneal inversion when standing on her tiptoes. Good subtalar motion. Neurovascularly intact distally. Walking with the assistance of a cane.      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 Patient found them to be comfortable prior to leaving the office. She will follow-up with me in 4 weeks for reevaluation. Hopefully she will get some benefit from these orthotics but her posterior tibialis tendon insufficiency is quite severe.  Total time spent with the patient was 30 minutes with greater than 50% of the time spent in face-to-face consultation discussing orthotic construction, instruction, and fitting.  Patient was fitted for a : standard, cushioned, semi-rigid orthotic. The orthotic was heated and afterward the patient stood on the orthotic blank positioned on the orthotic stand. The patient was positioned in subtalar neutral position and 10 degrees of ankle dorsiflexion in a weight bearing stance. After completion of molding, a stable base was applied to the orthotic blank. The blank was ground to a stable position  for weight bearing. Size: 8 Base: Blue EVA Posting: none Additional orthotic padding: none

## 2016-07-11 ENCOUNTER — Other Ambulatory Visit: Payer: Self-pay | Admitting: *Deleted

## 2016-07-11 MED ORDER — ROSUVASTATIN CALCIUM 10 MG PO TABS: 10.0000 mg | ORAL_TABLET | Freq: Every day | ORAL | Status: DC

## 2016-08-01 DIAGNOSIS — Z0100 Encounter for examination of eyes and vision without abnormal findings: Secondary | ICD-10-CM | POA: Diagnosis not present

## 2016-08-06 ENCOUNTER — Ambulatory Visit: Payer: Commercial Managed Care - HMO | Admitting: Sports Medicine

## 2016-08-07 ENCOUNTER — Other Ambulatory Visit: Payer: Self-pay | Admitting: Family Medicine

## 2016-08-09 NOTE — Telephone Encounter (Signed)
Patient has not had TSH in some time.  She will need to be seen at [facility] before any further refills of levothyroxine.

## 2016-08-29 ENCOUNTER — Ambulatory Visit (INDEPENDENT_AMBULATORY_CARE_PROVIDER_SITE_OTHER): Payer: Commercial Managed Care - HMO | Admitting: Family Medicine

## 2016-08-29 ENCOUNTER — Encounter: Payer: Self-pay | Admitting: Family Medicine

## 2016-08-29 VITALS — BP 165/88 | HR 91 | Temp 98.1°F | Wt 200.0 lb

## 2016-08-29 DIAGNOSIS — R739 Hyperglycemia, unspecified: Secondary | ICD-10-CM

## 2016-08-29 DIAGNOSIS — D126 Benign neoplasm of colon, unspecified: Secondary | ICD-10-CM

## 2016-08-29 DIAGNOSIS — I10 Essential (primary) hypertension: Secondary | ICD-10-CM

## 2016-08-29 DIAGNOSIS — E785 Hyperlipidemia, unspecified: Secondary | ICD-10-CM | POA: Diagnosis not present

## 2016-08-29 DIAGNOSIS — E042 Nontoxic multinodular goiter: Secondary | ICD-10-CM

## 2016-08-29 DIAGNOSIS — K219 Gastro-esophageal reflux disease without esophagitis: Secondary | ICD-10-CM

## 2016-08-29 DIAGNOSIS — Z23 Encounter for immunization: Secondary | ICD-10-CM | POA: Diagnosis not present

## 2016-08-29 DIAGNOSIS — R413 Other amnesia: Secondary | ICD-10-CM

## 2016-08-29 DIAGNOSIS — E038 Other specified hypothyroidism: Secondary | ICD-10-CM | POA: Diagnosis not present

## 2016-08-29 DIAGNOSIS — G35 Multiple sclerosis: Secondary | ICD-10-CM

## 2016-08-29 DIAGNOSIS — D0512 Intraductal carcinoma in situ of left breast: Secondary | ICD-10-CM

## 2016-08-29 DIAGNOSIS — Z79899 Other long term (current) drug therapy: Secondary | ICD-10-CM

## 2016-08-29 HISTORY — DX: Nontoxic multinodular goiter: E04.2

## 2016-08-29 HISTORY — DX: Intraductal carcinoma in situ of left breast: D05.12

## 2016-08-29 LAB — POCT GLYCOSYLATED HEMOGLOBIN (HGB A1C): Hemoglobin A1C: 5.5

## 2016-08-29 LAB — TSH: TSH: 3.1 mIU/L

## 2016-08-29 MED ORDER — OMEPRAZOLE 20 MG PO CPDR: 20.0000 mg | DELAYED_RELEASE_CAPSULE | Freq: Every day | ORAL | 1 refills | Status: DC

## 2016-08-29 NOTE — Assessment & Plan Note (Signed)
Established problem. Stable. Continue current therapy of Crestor Checking Lipids today

## 2016-08-29 NOTE — Assessment & Plan Note (Addendum)
Established problem. Stable. Lab Results  Component Value Date   TSH 3.10 08/29/2016  Adequate levothyroxine replacement Continue current therapy LT4 100 mcg daily. Recheck TSH in 6 to 12 months.

## 2016-08-29 NOTE — Assessment & Plan Note (Signed)
New problem Try trial of omeprazole 20 mg daily for two weeks to see if it helps with chest discomfort.

## 2016-08-29 NOTE — Assessment & Plan Note (Signed)
Established problem. Stable. No progression nor exacerbations.  Continue current therapy of Tecfidera and Ampyra Followed at [facility] Dr Gordy Clement.

## 2016-08-29 NOTE — Assessment & Plan Note (Signed)
Established problem. Stable. Continue current therapy Can stop NTG SL next ov

## 2016-08-29 NOTE — Progress Notes (Signed)
Subjective:    Patient ID: Mariah Jackson, female    DOB: 1961/09/11, 55 y.o.   MRN: [id number] Riham Contreras is alone Sources of clinical information for visit is/are patient and past medical records. Nursing assessment for this office visit was reviewed with the patient for accuracy and revision.   HPI Problem List Items Addressed This Visit      High   Multiple sclerosis ([place]) - Dx in 2001 after weakness in left lower leg - Followed by Dr Darreld Mclean. [facility]) - Taking Tecfidera and Ampyra - Only side effect is flushing.  No abd pain, no jaundice, no frequent infections, no diarrhea, no itching, no nausea - Pt reports no new MS lesions on MRI  Brain 2016 - Pt reports no progression of symptoms.  Denies flares.      Unprioritized   Multinodular goiter (nontoxic) - longstanding - no change in size - no difficulty swallowing or breathing. No orthopnea. No awakening gasping for air at night - - Ultrasound guided assessment by Dr Tera Helper in 03/2015 found no changes in dominant nodules, including prior biopsied nodule in right lobe.  No biopsy performed on this [place] monitor   Hypothyroidism Longstanding issue for patient Patient presents for evaluation of thyroid function.  Symptoms  denies fatigue, weight changes, heat/cold intolerance, bowel/skin changes or CVS symptoms.  The problem has been unchanged.   Previous thyroid studies include TSH and Neck US.  The hypothyroidism is due to hypothyroidism and multiple thyroid nodules.    Relevant Orders   TSH   Hyperlipidemia - Primary - longstanding problem - Taking Crestor  - denies jaundice, RUQ opain - Denies myalgias    Relevant Orders   Lipid panel   GERD (gastroesophageal reflux disease) - Chest pain noted 05/2016 by Dr Minda Ditto with referral to Cardiology - Dr Percival Spanish though pain atypical and not needing further risk stratification - Pt believes chest pain is related to GERD.  She has tried otc GERD meds with  improvement in symptoms - she has not had to take a NTG SL - She would like to try a PPI   Relevant Medications   omeprazole (PRILOSEC) 20 MG capsule   Essential hypertension CHRONIC HYPERTENSION  Disease Monitoring  Blood pressure range: not checking  Chest pain: yes, see above   Dyspnea: yes, but only after exercise   Claudication: no   Medication compliance: yes  Medication Side Effects  Lightheadedness: no   Urinary frequency: no   Edema: no         Other Visit Diagnoses    Encounter for immunization       Relevant Medications   omeprazole (PRILOSEC) 20 MG capsule   Other Relevant Orders   Flu Vaccine QUAD 36+ mos IM (Completed)   High risk medications (not anticoagulants) long-term use       Relevant Orders   COMPLETE METABOLIC PANEL WITH GFR   Hyperglycemia       Relevant Orders   POCT glycosylated hemoglobin (Hb A1C) (Completed)      SH: smoking status reviewed  Review of Systems See HPI No diplopia No vision loss No falls    Objective:   Physical Exam VS reviewed GEN: Alert, Cooperative, Groomed, NAD Gait: Normal speed, No significant path deviation, Step through +, Able to go up on toes bilaterally holding lightly onto examiner for balance  Uses arms to rise from chair.  Using a one point cane Psych: Normal affect/thought/speech/language  Assessment & Plan:

## 2016-08-29 NOTE — Assessment & Plan Note (Signed)
New problem noted on Medicare AWV MiniCog 2/5 points fail Will check with [facility] next ov

## 2016-08-30 ENCOUNTER — Encounter: Payer: Self-pay | Admitting: Family Medicine

## 2016-08-30 LAB — LIPID PANEL
CHOL/HDL RATIO: 2.2 ratio (ref <=5.0)
Cholesterol: 174 mg/dL (ref 125–200)
HDL: 80 mg/dL (ref >=46)
LDL CALC: 72 mg/dL (ref <130)
Triglycerides: 109 mg/dL (ref <150)
VLDL: 22 mg/dL (ref <30)

## 2016-08-30 LAB — COMPLETE METABOLIC PANEL WITH GFR
ALT: 18 U/L (ref 6–29)
AST: 17 U/L (ref 10–35)
Albumin: 4.6 g/dL (ref 3.6–5.1)
Alkaline Phosphatase: 111 U/L (ref 33–130)
BUN: 10 mg/dL (ref 7–25)
CO2: 21 mmol/L (ref 20–31)
Calcium: 10.1 mg/dL (ref 8.6–10.4)
Chloride: 106 mmol/L (ref 98–110)
Creat: 0.82 mg/dL (ref 0.50–1.05)
GFR, Est African American: >89 mL/min (ref >=60)
GFR, Est Non African American: 80 mL/min (ref >=60)
GLUCOSE: 91 mg/dL (ref 65–99)
POTASSIUM: 4.1 mmol/L (ref 3.5–5.3)
SODIUM: 142 mmol/L (ref 135–146)
TOTAL PROTEIN: 7.4 g/dL (ref 6.1–8.1)
Total Bilirubin: 0.7 mg/dL (ref 0.2–1.2)

## 2016-08-30 MED ORDER — LEVOTHYROXINE SODIUM 100 MCG PO TABS: ORAL_TABLET | ORAL | 3 refills | Status: DC

## 2016-08-30 NOTE — Addendum Note (Signed)
Addended byWendy Poet, Caylen Kuwahara D on: 08/30/2016 12:02 PM   Modules accepted: Orders

## 2016-09-12 ENCOUNTER — Other Ambulatory Visit: Payer: Self-pay | Admitting: Family Medicine

## 2016-09-12 DIAGNOSIS — Z1231 Encounter for screening mammogram for malignant neoplasm of breast: Secondary | ICD-10-CM

## 2016-09-16 ENCOUNTER — Ambulatory Visit: Payer: Commercial Managed Care - HMO | Admitting: Nurse Practitioner

## 2016-09-18 ENCOUNTER — Ambulatory Visit
Admission: RE | Admit: 2016-09-18 | Discharge: 2016-09-18 | Disposition: A | Discharge status: Home / Self Care | Payer: Commercial Managed Care - HMO | Source: Ambulatory Visit | Attending: Family Medicine | Admitting: Family Medicine

## 2016-09-18 DIAGNOSIS — Z1231 Encounter for screening mammogram for malignant neoplasm of breast: Secondary | ICD-10-CM | POA: Diagnosis not present

## 2016-10-01 ENCOUNTER — Other Ambulatory Visit: Payer: Self-pay | Admitting: Nurse Practitioner

## 2016-10-01 NOTE — Telephone Encounter (Signed)
Spoke to pt and she aware needs appt.  Needs to speak to billing to pay on her account.   I attempted to transfer, both lines no answer (angie and gaynell).  Told pt to call back and speak with accounting and they will transfer call to operator and they can make appt.  I will refill once done.   She had one more bottle tecfidera for one month and was low on her ampyra.

## 2016-10-07 ENCOUNTER — Other Ambulatory Visit: Payer: Self-pay | Admitting: *Deleted

## 2016-10-07 NOTE — Telephone Encounter (Signed)
Refilled for 3 months.  Pt aware of need to pay bill and making RV appt.

## 2016-10-08 ENCOUNTER — Encounter: Payer: Self-pay | Admitting: Nurse Practitioner

## 2016-10-08 ENCOUNTER — Ambulatory Visit (INDEPENDENT_AMBULATORY_CARE_PROVIDER_SITE_OTHER): Payer: Commercial Managed Care - HMO | Admitting: Nurse Practitioner

## 2016-10-08 VITALS — BP 138/84 | HR 83 | Ht 64.0 in | Wt 200.2 lb

## 2016-10-08 DIAGNOSIS — G35 Multiple sclerosis: Secondary | ICD-10-CM

## 2016-10-08 DIAGNOSIS — R269 Unspecified abnormalities of gait and mobility: Secondary | ICD-10-CM

## 2016-10-08 NOTE — Patient Instructions (Signed)
Continue tecfidera 240 twice daily Continue Ampyra 10 mg twice daily Recent labs reviewed CMP Call for exacerbation of symptoms Follow-up in 6 months, next with Dr. Krista Blue

## 2016-10-08 NOTE — Progress Notes (Signed)
[facility]  PATIENT: Mariah Jackson DOB: 01-18-1961   REASON FOR VISIT: Follow-up for multiple sclerosis, abnormality of gait HISTORY FROM: Patient    HISTORY OF PRESENT ILLNESS: HISTORY: Mariah Jackson, 55 year old female returns for followup for RRMS She was diagnosed with multiple sclerosis in 2001, by Dr. Dellis Filbert at [facility]. The disease has primarily affected her balance and gait. She could only clarify one exacerbations, presenting as worsening balance difficulty, and says she has never been treated with intravenous steroids.   She was placed on Betaseron shortly after diagnosis, and has been on that drug ever since. Her gait difficulty had little changes over past 11 years. She ambulates with a cane, but is able to get around the house pretty well without it.   Most recent MRI was in January 2011,shows multiple periventricular, periatrial and subcortical white matter hyperintensities suggestive of demyelinating disease.No enhancing lesions or significant atrophy is seen. compared to MRI films from Aug 2000 there appear to be more T2 lesions in right frontal subcortical region.   She is tired of taking the shots and does have multiple areas of lipoatrophy, especially on the abdominal area. She is ambulating with a cane. She is wishing to switch to an oral preparation, was switched to [facility] Oct 2014, doing well.  UPDATE Sep 18th 2015: She denies signficant side effect from Tecfidera. She has been on Ampyra 10mg  bid for few years, which definitely has helped her walking  MRI brain March 2014: Multiple periventricular and subcortical chronic demyelinating disease. No acute plaques are seen. MRI cervical spine: Disc bulging at C5-6. Spondylosis at C5-6 and C6-7. C5-6 there is focal disc protrusion slightly deforms anterior spinal cord, with spinal stenosis or foraminal stenosis. No intrinsic or enhancing spinal cord lesions.  UPDATE March 17th 2016: She fell in  Feb 2016, now with right shoulder and neck pain, overall has improved, she is continue taking Tecfidera, Ampyra, denies significant side effect, ambulate with spastic gait, wear her right ankle brace. UPDATE 03/18/2016 CMMs.Mariah Jackson, 55 year old female returns for follow-up. She was last seen in the office 09/19/15.  She has a history of relapsing remitting multiple sclerosis. She was diagnosed in 2001. She is currently on tecfidera and denies any side effects. She is not having any exacerbation of symptoms. She also is on Ampyra for her gait abnormality which has been very beneficial. She denies any recent falls. She ambulates with a single-point cane. She has had no new weakness or sensory changes, visual disturbance, speech or swallowing difficulties or loss of bowel or bladder control since last seen. She returns for reevaluation  UPDATE 10/10/17CM Mariah Jackson, 55 year old female returns for follow-up with history of relapsing remitting multiple. She was originally diagnosed in 2001. She is currently on tecfidera and has very rare hot flashes or flushing that just lasts moments she states. She has not had any exacerbation of her MS symptoms, she denies any double vision or visual symptoms  and no weakness no increased spasticity she denies any problems with bowel or bladder control. She denies any swallowing difficulty .She ambulates with single-point cane. She is on ampyra which has helped her ambulation. She has a history of fractured right ankle and she wears a brace to her right ankle. She returns for reevaluation   REVIEW OF SYSTEMS: Full 14 system review of systems performed and notable only for those listed, all others are neg:  Constitutional: neg  Cardiovascular: neg Ear/Nose/Throat: neg  Skin: neg Eyes: neg Respiratory: neg Gastroitestinal: neg  Hematology/Lymphatic: neg  Endocrine: neg Musculoskeletal: Walking difficulty  Allergy/Immunology: neg Neurological: neg Psychiatric: neg Sleep  : neg   ALLERGIES: Allergies  Allergen Reactions  . Vicodin [Hydrocodone-Acetaminophen] Nausea Only    HOME MEDICATIONS: Outpatient Medications Prior to Visit  Medication Sig Dispense Refill  . amLODipine (NORVASC) 10 MG tablet TAKE 1 TABLET EVERY DAY 90 tablet 4  . aspirin EC 81 MG tablet Take 1 tablet (81 mg total) by mouth daily. 90 tablet 3  . dalfampridine (AMPYRA) 10 MG TB12 Take 1 tablet (10 mg total) by mouth every 12 (twelve) hours. 180 tablet 3  . ibuprofen (ADVIL,MOTRIN) 600 MG tablet TAKE 1 TABLET EVERY 8 HOURS AS NEEDED 30 tablet 0  . levothyroxine (SYNTHROID, LEVOTHROID) 100 MCG tablet TAKE 1 TABLET EVERY DAY BEFORE BREAKFAST 90 tablet 3  . metoprolol tartrate (LOPRESSOR) 25 MG tablet Take 0.5 tablets (12.5 mg total) by mouth 2 (two) times daily. 90 tablet 3  . Multiple Vitamin (MULTIVITAMIN) tablet Take 1 tablet by mouth daily.    . nitroGLYCERIN (NITROSTAT) 0.4 MG SL tablet Place 1 tablet (0.4 mg total) under the tongue every 5 (five) minutes as needed for chest pain. 50 tablet 3  . omeprazole (PRILOSEC) 20 MG capsule Take 1 capsule (20 mg total) by mouth daily. 90 capsule 1  . rosuvastatin (CRESTOR) 10 MG tablet Take 1 tablet (10 mg total) by mouth daily. 90 tablet 1  . TECFIDERA 240 MG CPDR TAKE 1 CAPSULE BY MOUTH TWICE A DAY 180 capsule 0   No facility-administered medications prior to visit.     PAST MEDICAL HISTORY: Past Medical History:  Diagnosis Date  . Abnormality of gait 09/10/2013  . Breast cancer ([place]) 2007   left  . 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 - SEE ONCOLOGY TABLE.  COMMENT ONCOLOGY TABLE-BREAST, IN SITU CARCINOMA ONLY  . Essential hypertension 11/09/2014  . History of breast lump/mass excision 11/09/2014   Lumpectomy 2007. Pathology report non-cancerous, fibrous tissue.    Marland Kitchen Hyperlipidemia   . Hypertension   .  Hypothyroidism   . Multinodular goiter (nontoxic) 08/29/2016  . Multiple sclerosis ([place])   . Thyroid enlargement 11/09/2014   Right sided thyroid enlargement      PAST SURGICAL HISTORY: Past Surgical History:  Procedure Laterality Date  . BREAST LUMPECTOMY  2007   left, radiation  . TUBAL LIGATION  1985    FAMILY HISTORY: Family History  Problem Relation Age of Onset  . Colon cancer Neg Hx   . Stroke Father   . Hypertension Mother   . Hypertension Sister   . Hypertension Brother   . Diabetes Brother 5    Prediabetic  . Hypertension Brother   . Hypertension Sister     SOCIAL HISTORY: Social History   Social History  . Marital status: Divorced    Spouse name: N/A  . Number of children: 2  . Years of education: 12   Occupational History  . Unemployed    . Disabiliy    Social History Main Topics  . Smoking status: Never Smoker  . Smokeless tobacco: Never Used  . Alcohol use No  . Drug use: No  . Sexual activity: No   Other Topics Concern  . Not on file   Social History Narrative   Patient lives at home with her daughter and son.    Patient has 2 children.  Patient is divorced.    Patient a high school education.    Patient is unemployed.      PHYSICAL EXAM  Vitals:   10/08/16 0938  BP: 138/84  Pulse: 83  Weight: 200 lb 3.2 oz (90.8 kg)  Height: 5\' 4"  (1.626 m)   Body mass index is 34.36 kg/m. Generalized: Well developed, obese female in no acute distress  Head: normocephalic and atraumatic,. Oropharynx benign  Neck: Supple, no carotid bruits  Cardiac: Regular rate rhythm, no murmur  Musculoskeletal: No deformity   Neurological examination   Mentation: Alert oriented to time, place, history taking. Attention span and concentration appropriate. Recent and remote memory intact. Follows all commands speech and language fluent.   Cranial nerve II-XII:  Visual acuity 20/40 right 20/30 left. Fundoscopic exam reveals sharp disc  margins.Pupils were equal round reactive to light extraocular movements were full, visual field were full on confrontational test. Facial sensation and strength were normal. hearing was intact to finger rubbing bilaterally. Uvula tongue midline. head turning and shoulder shrug were normal and symmetric.Tongue protrusion into cheek strength was normal. Motor: mild bilateral lower extremity spasticity mild bilateral hip flexion and ankle dorsiflexion weakness, brace to right  ankle Sensory: normal and symmetric to light touch, pinprick, and Vibration, In the upper and lower extremities Coordination: finger-nose-finger, heel-to-shin bilaterally, no dysmetria Reflexes: Brachioradialis 2/2, biceps 2/2, triceps 2/2, patellar 2/2, Achilles 2/2, plantar responses were flexor bilaterally. Gait and Station: Rising up from seated position without assistance, wide based cautious mildly unsteady gait. Ambulates with single-point cane  DIAGNOSTIC DATA (LABS, IMAGING, TESTING) - I reviewed patient records, labs, notes, testing and imaging myself where available.      Component Value Date/Time   NA 142 08/29/2016 1227   NA 144 03/18/2016 1002   K 4.1 08/29/2016 1227   CL 106 08/29/2016 1227   CO2 21 08/29/2016 1227   GLUCOSE 91 08/29/2016 1227   BUN 10 08/29/2016 1227   BUN 11 03/18/2016 1002   CREATININE 0.82 08/29/2016 1227   CALCIUM 10.1 08/29/2016 1227   PROT 7.4 08/29/2016 1227   PROT 7.6 03/18/2016 1002   ALBUMIN 4.6 08/29/2016 1227   ALBUMIN 4.7 03/18/2016 1002   AST 17 08/29/2016 1227   ALT 18 08/29/2016 1227   ALKPHOS 111 08/29/2016 1227   BILITOT 0.7 08/29/2016 1227   BILITOT 0.5 03/18/2016 1002   GFRNONAA 80 08/29/2016 1227   GFRAA >89 08/29/2016 1227    ASSESSMENT AND PLAN 55 y.o. year old female has a past medical history of Multiple sclerosis; relapsing remitting and gait abnormality here to follow-up. MRI brain March 2014: Multiple periventricular and subcortical chronic  demyelinating disease. No acute plaques are seen. MRI cervical spine: Disc bulging at C5-6. Spondylosis at C5-6 and C6-7. C5-6 there is focal disc protrusion slightly deforms anterior spinal cord, with spinal stenosis or foraminal stenosis. No intrinsic or enhancing spinal cord lesions.   Continue tecfidera 240 twice daily Continue Ampyra 10 mg twice daily Reviewed  CMP drawn on 08/29/2016 Call for exacerbation of symptoms Follow-up in 6 months next with Dr. Luan Pulling, [facility], [facility], [facility] [address] [place], [place] [address] [phone]

## 2016-10-14 ENCOUNTER — Telehealth: Payer: Self-pay | Admitting: Family Medicine

## 2016-10-14 ENCOUNTER — Other Ambulatory Visit: Payer: Self-pay | Admitting: Family Medicine

## 2016-10-14 DIAGNOSIS — R05 Cough: Secondary | ICD-10-CM

## 2016-10-14 DIAGNOSIS — R059 Cough, unspecified: Secondary | ICD-10-CM

## 2016-10-14 MED ORDER — BENZONATATE 200 MG PO CAPS: 200.0000 mg | ORAL_CAPSULE | Freq: Two times a day (BID) | ORAL | 0 refills | Status: DC | PRN

## 2016-10-14 NOTE — Telephone Encounter (Signed)
Pt called because the medication for her cough we called in is not covered by her insurance. Can we call in something that would be covered by her insurance. jw

## 2016-10-14 NOTE — Telephone Encounter (Signed)
Pt is calling because she is congested and coughing. She would like the doctor to call in something for this. Please let patient know jw

## 2016-10-14 NOTE — Telephone Encounter (Signed)
-----   Message from Blane Ohara McDiarmid, MD sent at 10/14/2016  2:34 PM EDT ----- Regarding: Rx for cough  Please let patient know that Tessalon pearls called in for cough to her [facility] on [address]

## 2016-10-14 NOTE — Progress Notes (Signed)
I have reviewed and agreed above plan. 

## 2016-10-14 NOTE — Telephone Encounter (Signed)
Pt informed of rx sent to her pharmacy for cough.

## 2016-10-14 NOTE — Telephone Encounter (Signed)
Will forward to MD to advise. Jazmin Hartsell,CMA  

## 2016-10-15 MED ORDER — DEXTROMETHORPHAN HBR 15 MG/5ML PO SYRP: 10.0000 mL | ORAL_SOLUTION | Freq: Four times a day (QID) | ORAL | 1 refills | Status: DC | PRN

## 2016-10-15 NOTE — Telephone Encounter (Signed)
Please let Mariah Jackson know I sent in a prescription for Dextromethorphan for her cough to her [facility]

## 2016-10-15 NOTE — Telephone Encounter (Signed)
Pt informed of new rx sent to her pharmacy.

## 2016-10-17 ENCOUNTER — Other Ambulatory Visit: Payer: Self-pay

## 2016-10-17 NOTE — Patient Outreach (Signed)
[facility]) Care Management  10/17/2016   Mariah Jackson Jun 14, 1961 [id number]  Telephonic Screening   Referral Date:  10/07/16 Source:  [facility] Prevent Issue:  MS, Breast Cancer history 2007, cholesterol management. [employer]  Daughter, [name]   Outreach call #1 to patient.   Patient not reached.  RN CM left HIPAA compliant voice message with name and number for call back.  RN CM scheduled for next outreach call within one week.   Nathaneil Canary, BSN, RN, [facility] Care Management Coordinator [phone] Direct [phone] Cell [phone] Office [phone] Fax [email or web address]

## 2016-10-18 ENCOUNTER — Other Ambulatory Visit: Payer: Self-pay

## 2016-10-18 NOTE — Patient Outreach (Signed)
[facility]) Care Management  10/18/2016  Mariah Jackson [birth year] [id number]   Telephonic Screening   Referral Date:  10/07/16 Source:  Emmi Prevent Issue:  MS, Breast Cancer history 2007, cholesterol management. Daughter, Mariah Jackson  Insurance:  [employer] and [place]   Subjective:  Outreach call #1 to patient.  Patient reached and completed Screening and Initial Assessment.  Mariah Jackson [id number] Patient C/O having a cough relating to what she believes has been a cold.  Patient notified her MD who called in a prescription but patient was unable to afford the $37.00 co-pay and requested MD call something in covered by her insurance.  States MD has called in something over the counter but she has not picked it up yet. States normally she is able to afford her co-pays but this medication was not covered by her insurance provider.   Providers: Primary MD:  Dr. Sherren Mocha Diarmid -  last appt: 08/29/16  Neurologist:  Dr. Marcial Pacas and Cecille Rubin, NP - 10/08/2016  HH: none   Social: Patient lives in her home with daughter and son. Pets: 3 dogs.  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  Mobility:  cane Falls: none Pain: none  Depression: no Transportation: [employer] Caregiver: daughter  Emergency Contact: daughter, Mariah Arbour, CNA Advance Directive:  No but interested.  Consent:  Yes  Resources: -Financial payment plan with the neurology office. -MS Society   DME: cane, walker, partial plate (upper), BP monitor (digital), eyeglasses (No scales in the home).   Co-morbidities  Essential HTN, Hyperlipidemia, MS (2001), GERD, Hypothyroidism; H/o Breast Cancer (2007) Admissions: 0 ER visits: 0  HTN BP 138/84 10/08/2016 Weight 200 lb (91 kg) 10/08/2016 Height 64 in (163 cm) 10/08/2016 BMI 34.40 (Obese Class I) 10/08/2016 Managed with Amlodipine.  Patient also has a prescription of Metoprolol  but is not clear on instructions or if she is supposed to be taking.  States the only BP medication she is taking is the Amlodipine.  HDL 80.000 08/29/2016 LDL 72.000 08/29/2016 Cholesterol, total 174.000 08/29/2016 Triglycerides 109.000 08/29/2016 A1C 5.500 08/29/2016 Managed with Crestor   Medications:  Patient taking more than / less than 15 medications  Co-pay cost issues: none except  Cough medicine:  $37.00 co-pay called MD back and requested to order something covered by insurance.  MD ordered OTC medication and patient needs to pick up at pharmacy.   Prevnar (PCV13) N/D Pneumovax (PPS N/D Flu Vaccine 08/29/2016 tDAP Vaccine 05/08/2016 Mail Order  Medication Reconciliation completed with patient 10/18/16   Encounter Medications:  Outpatient Encounter Prescriptions as of 10/18/2016  Medication Sig Note  . amLODipine (NORVASC) 10 MG tablet TAKE 1 TABLET EVERY DAY   . aspirin EC 81 MG tablet Take 1 tablet (81 mg total) by mouth daily.   Marland Kitchen dalfampridine (AMPYRA) 10 MG TB12 Take 1 tablet (10 mg total) by mouth every 12 (twelve) hours.   Marland Kitchen ibuprofen (ADVIL,MOTRIN) 600 MG tablet TAKE 1 TABLET EVERY 8 HOURS AS NEEDED   . levothyroxine (SYNTHROID, LEVOTHROID) 100 MCG tablet TAKE 1 TABLET EVERY DAY BEFORE BREAKFAST   . Multiple Vitamin (MULTIVITAMIN) tablet Take 1 tablet by mouth daily.   . nitroGLYCERIN (NITROSTAT) 0.4 MG SL tablet Place 1 tablet (0.4 mg total) under the tongue every 5 (five) minutes as needed for chest pain.   Marland Kitchen omeprazole (PRILOSEC) 20 MG capsule Take 1 capsule (20 mg total) by mouth daily.   Marland Kitchen  rosuvastatin (CRESTOR) 10 MG tablet Take 1 tablet (10 mg total) by mouth daily.   . TECFIDERA 240 MG CPDR TAKE 1 CAPSULE BY MOUTH TWICE A DAY   . dextromethorphan 15 MG/5ML syrup Take 10 mLs (30 mg total) by mouth 4 (four) times daily as needed for cough. (Patient not taking: Reported on 10/18/2016)   . metoprolol tartrate (LOPRESSOR) 25 MG tablet Take 0.5 tablets (12.5 mg total)  by mouth 2 (two) times daily. (Patient not taking: Reported on 10/18/2016) 08/29/2016: Takes "once in a blue moon"   No facility-administered encounter medications on file as of 10/18/2016.     Functional Status:  In your present state of health, do you have any difficulty performing the following activities: 10/18/2016 06/04/2016  Hearing? N N  Vision? N N  Difficulty concentrating or making decisions? N N  Walking or climbing stairs? N Y  Dressing or bathing? N N  Doing errands, shopping? N N  Preparing Food and eating ? N N  Using the Toilet? N N  In the past six months, have you accidently leaked urine? N Y  Do you have problems with loss of bowel control? N N  Managing your Medications? N N  Managing your Finances? N N  Housekeeping or managing your Housekeeping? N N  Some recent data might be hidden    Fall/Depression Screening: PHQ 2/9 Scores 10/18/2016 08/29/2016 07/09/2016 06/10/2016 06/04/2016 05/08/2016 07/06/2015  PHQ - 2 Score 0 0 0 0 1 0 0    Fall Risk  10/18/2016 07/09/2016 06/10/2016 06/04/2016 05/08/2016  Falls in the past year? No No No No No  Number falls in past yr: - - - - -  Risk for fall due to : Impaired balance/gait;Other (Comment) - - Impaired balance/gait;Impaired mobility -  Risk for fall due to (comments): uses a cane due to MS. - - Patient with MS -    Preventives: Hearing: Never screened but no issues Eyes: Dr:  (patient unable to find name).   Summer 2017 Dentist: None.  Dental Insurance with [employer] but has not started making payment for the policy.  Patient has two teeth that needs extracting but scared; fear of needles.  Colonoscopy 05/07/2013 Mammogram 09/19/2016  Assessment / Plan:  Referral Date:  10/07/16 Screening and Initial Assessment:  10/18/16 [facility] Telephonic RN CM services:  10/18/2016 Program:  HTN 10/18/16  HTN Patient motivated to work on weight reduction.  Patient agreed to education on HTN, Low Carb, Low Salt diet.   -RN CM will search  [employer] Benefit for scales benefit  Medications  -RN CM will contact Primary MD to verify if patient is supposed to be taking Amlodipine and Metoprolol.   Advance Directive Patient interested in completing.  Advance Directive Document (mailed 10/18/2016) RN CM will continue to provide ongoing education and supportive assistance in completion of document.   Emmi Education (mailed 10/18/16) -About High Blood Pressure (Hypertension) -High Blood Pressure (Hypertension): Health Problems -High Blood Pressure (Hypertension):  Taking Your Blood Pressure -High Blood Pressure (Hypertension):  What You Can Do -High blood Pressure (Hypertension):  What You Can Do -Keeping An Eye On Your Blood Pressure -Medications For High Blood Pressure (Hypertension) -Low-Salt Diet -Counting Carbohydrates -Portion Size -Reading Food Labels Chart -Advance Directive Document RN CM will review the above educational materials with patient over the next several months.  RN CM advised in next [facility] scheduled contact call within next 30 days for monthly assessment and care coordination services as needed. RN CM advised  to please notify MD of any changes in condition prior to scheduled appt's.   RN CM provided contact name and # [phone] or main office # [phone] and 24-hour nurse line # [phone].  RN CM confirmed patient is aware of 911 services for urgent emergency needs.  RN CM sent successful outreach letter and  [facility] Introductory package. RN CM notified [place] Management Assistant: agreed to services/case opened. RN CM sent Physician Enrollment/Barriers Letter and Initial Assessment to Primary MD  [facility] CM Care Plan Problem One   Flowsheet Row Most Recent Value  Care Plan Problem One  (P) Knowledge deficit relating to HTN self management interventions.   Role Documenting the Problem One  (P) Care Management Telephonic [place] for Problem One  (P) Active  THN Long Term Goal (31-90  days)  (P) Patient will improve self managment knowledge of HTN mangment over the next 31-90 days by demonstrating improved diet.   THN Long Term Goal Start Date  (P) 10/18/16  Interventions for Problem One Long Term Goal  (P) RN CM will provide supportive education on HTN managment over the next 31-90 days.   THN CM Short Term Goal #1 (0-30 days)  (P) Patient will review Emmi Educational materials relating to HTN managment over the nex 31-90 days.   THN CM Short Term Goal #1 Start Date  (P) 10/18/16  Interventions for Short Term Goal #1  (P) RN CM will provide education on low carb diet over the next 30 days.     THN CM Care Plan Problem Two   Flowsheet Row Most Recent Value  Care Plan Problem Two  (P) Knowledge Deficit associated to instructions on HTN medication management.  Role Documenting the Problem Two  (P) Care Management [place] for Problem Two  (P) Active  THN CM Short Term Goal #1 (0-30 days)  (P) Patient will understand HTN medication orders over the next 30 days.   THN CM Short Term Goal #1 Start Date  (P) 10/18/16  Interventions for Short Term Goal #2   (P) RN CM will contact PCP to verify BP medication order over the next 30 days.     [facility] CM Care Plan Problem Three   Flowsheet Row Most Recent Value  Care Plan Problem Three  (P) Knowledge deficit associated to Advance Directives.   Role Documenting the Problem Three  (P) Care Management Telephonic [place] for Problem Three  (P) Active  THN Long Term Goal (31-90) days  (P) Patient will complete Advance Directive over the next 31 - 90 days.   THN Long Term Goal Start Date  (P) 10/18/16  Interventions for Problem Three Long Term Goal  (P) RN CM will provide coaching and education on comletion of Advance Directive over the next 31-90 days.       Nathaneil Canary, BSN, RN, [place] Management Care Management Coordinator [phone] Direct [phone]  Cell [phone] Office [phone] Fax [email or web address]

## 2016-11-15 ENCOUNTER — Ambulatory Visit: Payer: Self-pay

## 2016-11-18 ENCOUNTER — Ambulatory Visit: Payer: Self-pay

## 2016-12-02 ENCOUNTER — Other Ambulatory Visit: Payer: Self-pay

## 2016-12-02 NOTE — Patient Outreach (Signed)
[facility]) Care Management  12/02/2016  Starr Gazda [birth year] [id number]  Telephonic Monthly Assessment  Insurance:  [place] and Medicaid  [address]  [place] [address] [phone] (M)  Subjective:  Outreach call #1 to patient.  Patient reached and completed.   Providers: Primary MD:  Dr. Sherren Mocha Diarmid [phone] -  last appt: 08/29/16  - Next appt  6 months.  Patient thinks around January.  Neurologist:  Dr. Marcial Pacas and Cecille Rubin, NP - 10/08/2016  Next appt 04/21/17  Social: Patient lives in her home with daughter and son. Pets: 3 dogs.  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  Mobility:  cane Falls: none Pain: none  Depression: no Transportation: [employer] Caregiver: daughter  Emergency Contact: daughter, [name], CNA Advance Directive:  No but interested.  Document mailed to patient (10/18/16).  Consent:  Yes  Resources: -Financial payment plan with the neurology office. -[place] program participate for MS which covers some medical expense for patient.    DME: cane, walker, BP monitor (digital), partial plate (upper), eyeglasses  Co-morbidities  Essential HTN, Hyperlipidemia, MS (2001), GERD, Hypothyroidism; H/o Breast Cancer (2007) Admissions: 0 ER visits: 0  HTN BP 138/84 10/08/2016 Weight 200 lb (91 kg) 10/08/2016 Height 64 in (163 cm) 10/08/2016 BMI 34.40 (Obese Class I) 10/08/2016 Managed with Amlodipine.  Patient states clothes are fitting looser and cutting back on salt.      HDL 80.000 08/29/2016 LDL 72.000 08/29/2016 Cholesterol, total 174.000 08/29/2016 Triglycerides 109.000 08/29/2016 A1C 5.500 08/29/2016 Managed with Crestor   Medications:  Patient taking less than 15 medications  Co-pay cost issues: none except    Prevnar (PCV13) N/D Pneumovax (PPS N/D Flu Vaccine 08/29/2016 tDAP Vaccine 05/08/2016 Pharmacy:  Mail Order    Encounter Medications:  Outpatient Encounter Prescriptions as of 12/02/2016  Medication Sig Note  . amLODipine (NORVASC) 10 MG tablet TAKE 1 TABLET EVERY DAY   . aspirin EC 81 MG tablet Take 1 tablet (81 mg total) by mouth daily.   Marland Kitchen dalfampridine (AMPYRA) 10 MG TB12 Take 1 tablet (10 mg total) by mouth every 12 (twelve) hours.   Marland Kitchen ibuprofen (ADVIL,MOTRIN) 600 MG tablet TAKE 1 TABLET EVERY 8 HOURS AS NEEDED   . levothyroxine (SYNTHROID, LEVOTHROID) 100 MCG tablet TAKE 1 TABLET EVERY DAY BEFORE BREAKFAST   . Multiple Vitamin (MULTIVITAMIN) tablet Take 1 tablet by mouth daily.   . nitroGLYCERIN (NITROSTAT) 0.4 MG SL tablet Place 1 tablet (0.4 mg total) under the tongue every 5 (five) minutes as needed for chest pain.   Marland Kitchen omeprazole (PRILOSEC) 20 MG capsule Take 1 capsule (20 mg total) by mouth daily.   . rosuvastatin (CRESTOR) 10 MG tablet Take 1 tablet (10 mg total) by mouth daily.   . TECFIDERA 240 MG CPDR TAKE 1 CAPSULE BY MOUTH TWICE A DAY   . dextromethorphan 15 MG/5ML syrup Take 10 mLs (30 mg total) by mouth 4 (four) times daily as needed for cough. (Patient not taking: Reported on 12/02/2016)   . metoprolol tartrate (LOPRESSOR) 25 MG tablet Take 0.5 tablets (12.5 mg total) by mouth 2 (two) times daily. (Patient not taking: Reported on 12/02/2016) 12/02/2016: Patient not taking.    No facility-administered encounter medications on file as of 12/02/2016.     Functional Status:  In your present state of health, do you have any difficulty performing the following activities: 10/18/2016 06/04/2016  Hearing? N N  Vision? N N  Difficulty concentrating or making decisions? N N  Walking or climbing stairs? N Y  Dressing or bathing? N N  Doing errands, shopping? N N  Preparing Food and eating ? N N  Using the Toilet? N N  In the past six months, have you accidently leaked urine? N Y  Do you have problems with loss of bowel control? N N  Managing your Medications? N N  Managing your Finances?  N N  Housekeeping or managing your Housekeeping? N N  Some recent data might be hidden    Fall/Depression Screening: PHQ 2/9 Scores 10/18/2016 08/29/2016 07/09/2016 06/10/2016 06/04/2016 05/08/2016 07/06/2015  PHQ - 2 Score 0 0 0 0 1 0 0     Preventives: Hearing: Never screened but no issues Eyes: Dr:  (patient unable to find name).   Summer 2017 Dentist: None.  Dental Insurance with [employer] but has not started making payment for the policy.  Patient has two teeth that needs extracting but scared; fear of needles.  Colonoscopy 05/07/2013 Mammogram 09/19/2016  Assessment / Plan:  Referral Date:  10/07/16 Screening and Initial Assessment:  10/18/16 [facility] Telephonic RN CM services:  10/18/2016 Program:  HTN 10/18/2016  HTN -Patient has located her Benefits book and states he daughter is in the process of reviewing for home scales benefit.  RN CM will monitor progress and encouraged to purchase scales to improve home self management program and patient goal of weight loss.  RN CM encouraged to check home BP and log readings.  RN CM encouraged to schedule next MD appt.   Advance Directive H/o Patient interested in completing.  Advance Directive Document (mailed 10/18/2016 - received and reviewing with daughter) RN CM will continue to provide ongoing education and supportive assistance in completion of document.   Emmi Education (mailed 10/18/16 - reviewed 12/02/16) -About High Blood Pressure (Hypertension) -High Blood Pressure (Hypertension): Health Problems -High Blood Pressure (Hypertension):  Taking Your Blood Pressure -High Blood Pressure (Hypertension):  What You Can Do -High blood Pressure (Hypertension):  What You Can Do -Keeping An Eye On Your Blood Pressure -Medications For High Blood Pressure (Hypertension) -Low-Salt Diet -Counting Carbohydrates -Portion Size -Reading Food Labels Chart -Advance Directive Document RN CM will continue to review the above educational materials with  patient over the next several months as needed.  RN CM advised in next [facility] scheduled contact call within next 30 days for monthly assessment and care coordination services as needed. RN CM advised to please notify MD of any changes in condition prior to scheduled appt's.   RN CM provided contact name and # [phone] or main office # [phone] and 24-hour nurse line # [phone].  RN CM confirmed patient is aware of 911 services for urgent emergency needs.  Nathaneil Canary, BSN, RN, [place] Management Care Management Coordinator [phone] Direct [phone] Cell [phone] Office [phone] Fax [email or web address]

## 2016-12-13 ENCOUNTER — Ambulatory Visit: Payer: Commercial Managed Care - HMO

## 2016-12-16 ENCOUNTER — Telehealth: Payer: Self-pay | Admitting: *Deleted

## 2016-12-16 NOTE — Telephone Encounter (Signed)
Received approval for tecfidera 240mg  caps thru 12/29/2017.   ID [id number].  [employer] .

## 2016-12-25 ENCOUNTER — Other Ambulatory Visit: Payer: Self-pay | Admitting: *Deleted

## 2016-12-25 MED ORDER — DIMETHYL FUMARATE 240 MG PO CPDR: 1.0000 | DELAYED_RELEASE_CAPSULE | Freq: Two times a day (BID) | ORAL | 1 refills | Status: DC

## 2016-12-25 NOTE — Telephone Encounter (Signed)
Next appt with Dr. Krista Blue 04-21-17.

## 2016-12-31 ENCOUNTER — Ambulatory Visit: Payer: Self-pay

## 2017-01-01 ENCOUNTER — Ambulatory Visit: Payer: Self-pay

## 2017-01-02 ENCOUNTER — Other Ambulatory Visit: Payer: Self-pay

## 2017-01-02 NOTE — Patient Outreach (Signed)
[name]) Care Management  01/02/2017  Mariah Jackson 1961/09/15 [id number]  Telephonic Monthly Assessment  Insurance:  [employer] and [place]   Subjective:  Outreach call #1 to patient.  Patient reached and completed. [address]  [place] [address] [phone] ([phone] ([phone])  Providers: Primary MD:  Dr. Sherren Mocha McDiarmid [phone] -  last appt: 08/29/16  - Next appt  6 months.  Patient thinks around January.  Neurologist:  Dr. Marcial Pacas and Cecille Rubin, NP - 10/08/2016  Next appt 04/21/17  Social: Patient lives in her home with daughter and son. Pets: 3 dogs.  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  Mobility:  cane Falls: none Pain: none  Depression: no Transportation: [employer] Caregiver: daughter  Emergency Contact: daughter, [name], CNA Advance Directive:  No but interested.  Document mailed to patient (10/18/16).  Consent:  Yes  Resources: -Financial payment plan with the neurology office. -[place] program participate for MS which covers some medical expense for patient.    DME: cane, walker, BP monitor (digital), partial plate (upper), eyeglasses  Co-morbidities  Essential HTN, Hyperlipidemia, MS (2001), GERD, Hypothyroidism; H/o Breast Cancer (2007) Admissions: 0 ER visits: 0  HTN Managed with Amlodipine.   BP 138/84 10/08/2016 Weight 200 lb (91 kg) 10/08/2016 Height 64 in (163 cm) 10/08/2016 BMI 34.40 (Obese Class I) 10/08/2016  HDL 80.000 08/29/2016 LDL 72.000 08/29/2016 Cholesterol, total 174.000 08/29/2016 Triglycerides 109.000 08/29/2016 A1C 5.500 08/29/2016  Medications:  Prevnar (PCV13) N/D Pneumovax (PPS N/D Flu Vaccine 08/29/2016 tDAP Vaccine 05/08/2016 Pharmacy:  Mail Order   Encounter Medications:  Outpatient Encounter Prescriptions as of 01/02/2017  Medication Sig Note  . amLODipine (NORVASC) 10 MG tablet TAKE 1 TABLET EVERY DAY   . aspirin  EC 81 MG tablet Take 1 tablet (81 mg total) by mouth daily.   Marland Kitchen dalfampridine (AMPYRA) 10 MG TB12 Take 1 tablet (10 mg total) by mouth every 12 (twelve) hours.   . Dimethyl Fumarate (TECFIDERA) 240 MG CPDR Take 1 capsule (240 mg total) by mouth 2 (two) times daily.   Marland Kitchen ibuprofen (ADVIL,MOTRIN) 600 MG tablet TAKE 1 TABLET EVERY 8 HOURS AS NEEDED   . levothyroxine (SYNTHROID, LEVOTHROID) 100 MCG tablet TAKE 1 TABLET EVERY DAY BEFORE BREAKFAST   . Multiple Vitamin (MULTIVITAMIN) tablet Take 1 tablet by mouth daily.   . nitroGLYCERIN (NITROSTAT) 0.4 MG SL tablet Place 1 tablet (0.4 mg total) under the tongue every 5 (five) minutes as needed for chest pain.   Marland Kitchen omeprazole (PRILOSEC) 20 MG capsule Take 1 capsule (20 mg total) by mouth daily.   . rosuvastatin (CRESTOR) 10 MG tablet Take 1 tablet (10 mg total) by mouth daily.   Marland Kitchen dextromethorphan 15 MG/5ML syrup Take 10 mLs (30 mg total) by mouth 4 (four) times daily as needed for cough. (Patient not taking: Reported on 01/02/2017)   . metoprolol tartrate (LOPRESSOR) 25 MG tablet Take 0.5 tablets (12.5 mg total) by mouth 2 (two) times daily. (Patient not taking: Reported on 01/02/2017) 12/02/2016: Patient not taking.    No facility-administered encounter medications on file as of 01/02/2017.     Functional Status:  In your present state of health, do you have any difficulty performing the following activities: 01/02/2017 10/18/2016  Hearing? N N  Vision? N N  Difficulty concentrating or making decisions? N N  Walking or climbing stairs? N N  Dressing or bathing? N N  Doing errands,  shopping? N N  Preparing Food and eating ? - N  Using the Toilet? - N  In the past six months, have you accidently leaked urine? - N  Do you have problems with loss of bowel control? - N  Managing your Medications? - N  Managing your Finances? - N  Housekeeping or managing your Housekeeping? - N  Some recent data might be hidden    Fall/Depression Screening: PHQ 2/9  Scores 01/02/2017 12/02/2016 10/18/2016 08/29/2016 07/09/2016 06/10/2016 06/04/2016  PHQ - 2 Score 0 0 0 0 0 0 1    Fall Risk  01/02/2017 12/02/2016 10/18/2016 07/09/2016 06/10/2016  Falls in the past year? No No No No No  Number falls in past yr: - - - - -  Risk for fall due to : - Impaired mobility Impaired balance/gait;Other (Comment) - -  Risk for fall due to (comments): - Cane uses a cane due to MS. - -    Preventives: Hearing: Never screened but no issues Eyes: Dr:  (patient unable to find name).   Summer 2017 Dentist: None.  Dental Insurance with [employer] but has not started making payment for the policy.  Patient has two teeth that needs extracting but scared; fear of needles.  Colonoscopy 05/07/2013 Mammogram 09/19/2016  Assessment / Plan:  Referral Date:  10/07/16 Screening and Initial Assessment:  10/18/16 [facility] Telephonic RN CM services:  10/18/2016 - 01/02/2017 Program:  HTN 10/18/2016 - 01/02/2017  Advance Directive H/o Patient interested in completing.  Advance Directive Document (mailed 10/18/2016 - received and reviewing with daughter; will complete when ready.  Emmi Education (mailed 10/18/16 - reviewed 12/02/16, 01/02/2017) -About High Blood Pressure (Hypertension) -High Blood Pressure (Hypertension): Health Problems -High Blood Pressure (Hypertension):  Taking Your Blood Pressure -High Blood Pressure (Hypertension):  What You Can Do -High blood Pressure (Hypertension):  What You Can Do -Keeping An Eye On Your Blood Pressure -Medications For High Blood Pressure (Hypertension) -Low-Salt Diet -Counting Carbohydrates -Portion Size -Reading Food Labels Chart -Advance Directive Document RN CM will continue to review the above educational materials with patient over the next several months as needed.  RN CM advised goals of care met; case closure.  RN CM advised to please notify MD of any changes in condition prior to scheduled appt's.   RN CM provided contact name and # [phone] or  main office # [phone] and 24-hour nurse line # [phone].  RN CM confirmed patient is aware of 911 services for urgent emergency needs.  THN notified case closed MD notified via letter Case closure letter to patient.   [facility] CM Care Plan Problem One   Flowsheet Row Most Recent Value  Care Plan Problem One  Knowledge deficit relating to HTN self management interventions.   Role Documenting the Problem One  Care Management Telephonic Coordinator  Care Plan for Problem One  Active  THN Long Term Goal (31-90 days)  Patient will improve self managment knowledge of HTN mangment over the next 31-90 days by demonstrating improved diet.   THN Long Term Goal Start Date  10/18/16  THN Long Term Goal Met Date  01/02/17  Interventions for Problem One Long Term Goal  RN CM will provide supportive education on HTN managment over the next 31-90 days.   THN CM Short Term Goal #1 (0-30 days)  Patient will apply emmi education on low salt and low carb diet over the next 30 days.   THN CM Short Term Goal #1 Start Date  12/02/16  [facility]  CM Short Term Goal #1 Met Date  01/02/17  Interventions for Short Term Goal #1  RN CM will provide education on low carb / low salt diet over the next 30 days.     THN CM Care Plan Problem Two   Flowsheet Row Most Recent Value  Care Plan Problem Two  Knowledge Deficit associated to instructions on HTN medication management.  Role Documenting the Problem Two  Care Management Telephonic Coordinator  Care Plan for Problem Two  Active  THN CM Short Term Goal #1 (0-30 days)  Patient will check and log home BP reading over the next 30 days.   THN CM Short Term Goal #1 Start Date  12/02/16  [facility] CM Short Term Goal #1 Met Date   01/02/17  Interventions for Short Term Goal #2   RN CM will provide education on importance of self management interventions for home BP managment program over the next 30 days.     [facility] CM Care Plan Problem Three   Flowsheet Row Most Recent Value   Care Plan Problem Three  Knowledge deficit associated to Advance Directives.   Role Documenting the Problem Three  Care Management Telephonic Coordinator  Care Plan for Problem Three  Active  THN Long Term Goal (31-90) days  Patient will complete Advance Directive over the next 31 - 90 days.   THN Long Term Goal Start Date  10/18/16  THN Long Term Goal Met Date  01/02/17  Interventions for Problem Three Long Term Goal  RN CM will provide coaching and education on comletion of Advance Directive over the next 31-90 days.   THN CM Short Term Goal #1 (0-30 days)  Patient will review with daughter over the next 30 days  THN CM Short Term Goal #1 Start Date  12/02/16  [facility] CM Short Term Goal #1 Met Date  01/02/17  Interventions for Short Term Goal #1  RN CM will review for questions and further instruction on Advance Directive over the next 30 days.Mariann Laster, MSHL, BSN, RN, [place] Care Management Care Management Coordinator [phone] Direct [phone] Cell [phone] Office [phone] Fax [email or web address]

## 2017-01-13 ENCOUNTER — Other Ambulatory Visit: Payer: Self-pay | Admitting: Family Medicine

## 2017-01-17 ENCOUNTER — Ambulatory Visit: Payer: Commercial Managed Care - HMO

## 2017-01-20 ENCOUNTER — Other Ambulatory Visit: Payer: Self-pay | Admitting: *Deleted

## 2017-01-20 MED ORDER — DALFAMPRIDINE ER 10 MG PO TB12: 10.0000 mg | ORAL_TABLET | Freq: Two times a day (BID) | ORAL | 3 refills | Status: DC

## 2017-02-18 ENCOUNTER — Other Ambulatory Visit: Payer: Self-pay | Admitting: *Deleted

## 2017-02-19 MED ORDER — IBUPROFEN 600 MG PO TABS: 600.0000 mg | ORAL_TABLET | Freq: Three times a day (TID) | ORAL | 3 refills | Status: DC | PRN

## 2017-04-20 DIAGNOSIS — Z0100 Encounter for examination of eyes and vision without abnormal findings: Secondary | ICD-10-CM | POA: Diagnosis not present

## 2017-04-21 ENCOUNTER — Ambulatory Visit: Payer: Commercial Managed Care - HMO | Admitting: Neurology

## 2017-04-24 ENCOUNTER — Ambulatory Visit (INDEPENDENT_AMBULATORY_CARE_PROVIDER_SITE_OTHER): Payer: Medicare HMO | Admitting: Family Medicine

## 2017-04-24 ENCOUNTER — Encounter: Payer: Self-pay | Admitting: Family Medicine

## 2017-04-24 VITALS — BP 138/80 | HR 96 | Temp 98.2°F | Wt 197.0 lb

## 2017-04-24 DIAGNOSIS — E7800 Pure hypercholesterolemia, unspecified: Secondary | ICD-10-CM

## 2017-04-24 DIAGNOSIS — Z79899 Other long term (current) drug therapy: Secondary | ICD-10-CM | POA: Diagnosis not present

## 2017-04-24 DIAGNOSIS — E038 Other specified hypothyroidism: Secondary | ICD-10-CM | POA: Diagnosis not present

## 2017-04-24 DIAGNOSIS — Z853 Personal history of malignant neoplasm of breast: Secondary | ICD-10-CM

## 2017-04-24 DIAGNOSIS — I10 Essential (primary) hypertension: Secondary | ICD-10-CM | POA: Diagnosis not present

## 2017-04-24 DIAGNOSIS — G35 Multiple sclerosis: Secondary | ICD-10-CM

## 2017-04-24 NOTE — Patient Instructions (Signed)
Dr Savaughn Karwowski will call you if your tests are not good. Otherwise he will send you a letter.  If you sign up for MyChart online, you will be able to see your test results once Dr Lyndol Vanderheiden has reviewed them.  If you do not hear from us with in 2 weeks please call our office   

## 2017-04-25 ENCOUNTER — Encounter: Payer: Self-pay | Admitting: Family Medicine

## 2017-04-25 ENCOUNTER — Telehealth: Payer: Self-pay | Admitting: Neurology

## 2017-04-25 LAB — CBC WITH DIFFERENTIAL/PLATELET
BASOS ABS: 0 10*3/uL (ref 0.0–0.2)
BASOS: 0 %
EOS (ABSOLUTE): 0.1 10*3/uL (ref 0.0–0.4)
Eos: 4 %
Hematocrit: 42.5 % (ref 34.0–46.6)
Hemoglobin: 14.5 g/dL (ref 11.1–15.9)
IMMATURE GRANS (ABS): 0 10*3/uL (ref 0.0–0.1)
IMMATURE GRANULOCYTES: 0 %
LYMPHS: 18 %
Lymphocytes Absolute: 0.7 10*3/uL (ref 0.7–3.1)
MCH: 25.5 pg — AB (ref 26.6–33.0)
MCHC: 34.1 g/dL (ref 31.5–35.7)
MCV: 75 fL — AB (ref 79–97)
Monocytes Absolute: 0.3 10*3/uL (ref 0.1–0.9)
Monocytes: 8 %
NEUTROS PCT: 70 %
Neutrophils Absolute: 2.8 10*3/uL (ref 1.4–7.0)
PLATELETS: 260 10*3/uL (ref 150–379)
RBC: 5.68 x10E6/uL — ABNORMAL HIGH (ref 3.77–5.28)
RDW: 16.1 % — AB (ref 12.3–15.4)
WBC: 4 10*3/uL (ref 3.4–10.8)

## 2017-04-25 LAB — CMP14+EGFR
A/G RATIO: 1.7 (ref 1.2–2.2)
ALK PHOS: 151 IU/L — AB (ref 39–117)
ALT: 15 IU/L (ref 0–32)
AST: 20 IU/L (ref 0–40)
Albumin: 4.7 g/dL (ref 3.5–5.5)
BILIRUBIN TOTAL: 0.6 mg/dL (ref 0.0–1.2)
BUN/Creatinine Ratio: 12 (ref 9–23)
BUN: 10 mg/dL (ref 6–24)
CALCIUM: 9.9 mg/dL (ref 8.7–10.2)
CHLORIDE: 104 mmol/L (ref 96–106)
CO2: 24 mmol/L (ref 18–29)
Creatinine, Ser: 0.82 mg/dL (ref 0.57–1.00)
GFR calc Af Amer: 91 mL/min/{1.73_m2} (ref >59)
GFR, EST NON AFRICAN AMERICAN: 79 mL/min/{1.73_m2} (ref >59)
GLOBULIN, TOTAL: 2.8 g/dL (ref 1.5–4.5)
Glucose: 101 mg/dL — ABNORMAL HIGH (ref 65–99)
POTASSIUM: 3.7 mmol/L (ref 3.5–5.2)
SODIUM: 144 mmol/L (ref 134–144)
Total Protein: 7.5 g/dL (ref 6.0–8.5)

## 2017-04-25 LAB — TSH: TSH: 3.28 u[IU]/mL (ref 0.450–4.500)

## 2017-04-25 NOTE — Progress Notes (Signed)
   Subjective:    Patient ID: Mariah Jackson, female    DOB: Jul 29, 1961, 56 y.o.   MRN: [id number] Analilia Geddis is alone Sources of clinical information for visit is/are patient and past medical records. Nursing assessment for this office visit was reviewed with the patient for accuracy and revision.   HPI Problem List Items Addressed This Visit      High   Multiple sclerosis ([place]) - Diagnosed 2001. Followed by Dr Darreld Mclean. Krista Blue and NP Evlyn Courier (Neuro, [facility]) with whom last visit 09/2016. No exacerbations since last OV at [facility].   - Primary involvement of right lower leg motor function and balance and gait.  Pain with ambulating over 100 ft. Uses a swivel cane. Right ankle brace since fracturing ankle years ago.  No progression in impairments.  No change in vision. No difficulty urinating. No dysphagia.  - - Taking Ampyra and Tecfidera.  Treated with Betaseron in past from dx to 2014 when switched to Tecfidera. - Occasional Hot Flashes; No jaundic, no RUQ pain       Medium   Essential hypertension - Primary Disease Monitoring  Blood pressure range: no  Chest pain: no   Dyspnea: no   Claudication: no   Medication compliance: yes, Amlodipine  And Metoprolol Medication Side Effects  Lightheadedness: no   Urinary frequency: no   Edema: no    Preventitive Healthcare:  Exercise: No formal   Diet Pattern: regular  Salt Restriction: Less than 4 g     Hypothyroidism Hypothyroidism Longstanding issue for patient (+) Multinodular Goiter Patient presents for evaluation of thyroid function.  Symptoms consist of denies fatigue, weight changes, heat/cold intolerance, bowel/skin changes or CVS symptoms.  The problem has been stable.   Previous thyroid studies include TSH, Neck US, prior dominant nodule biopsy 2015  The hypothyroidism is due to hypothyroidism and multiple thyroid nodules.    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     Low   Pure hypercholesterolemia - taking Crestor - See HTN - Denies jaundice, RUQ pain    Other Visit Diagnoses    High risk medication use       Relevant Orders   CMP14+EGFR (Completed)   CBC with Differential/Platelet (Completed)   TSH (Completed)     SH: never smoked   Review of Systems See HPI No weight loss No falls    Objective:   Physical Exam VS reviewed GEN: Alert, Cooperative, Groomed, NAD Neuro:Bilateral legs spasticity. 4/5 strength hip flexion and ankle dorsiflexion. Right ankle brace in place  intact to light touch face, arms and feet, normal finger-nose-finger, heel-to-shin bilaterally Knee 2/2, Achilles 2/2, plantar responses normal bilaterally. Rising up from seated position without assistance, wide based stance, full swing and stance phases, (+) step thru. Ambulates with single-point cane  HEENT: PERRL; EAC bilaterally not occluded, TM's translucent with normal LM, (+) LR; COR: RRR, No M/G/R, No JVD, Normal PMI size and location LUNGS: BCTA, No Acc mm use, speaking in full sentences Psych: Conversational orientation to person, place and time. Normal affect/thought/speech/language     Assessment & Plan:

## 2017-04-25 NOTE — Assessment & Plan Note (Signed)
Established problem Controlled Continue Amlodipine.   Consider stop metoprolol. No clear indication for Beta-blockade therapy.  Add either CCB, Thiazide

## 2017-04-25 NOTE — Progress Notes (Signed)
Your lab test for monitoring for adverse effects from your Tecfidera and Ampyra were all normal except for a very mild increase in your liver's serum Alkaline Phosphatase.  This mild increase has been present since at least 2014. The increase could be from the [place] therapy.  . The other liver tests show no damage to your liver.  I would not recommend any changes in your MS medications.    I have forwarded a copy of these labs to Dr Krista Blue for her review.   Your Thyroid test, TSH, is in a good range.  I recommend you stay on your Levothyroxine 100 micrograms every day.

## 2017-04-25 NOTE — Assessment & Plan Note (Signed)
Needs surveillance mammogram

## 2017-04-25 NOTE — Assessment & Plan Note (Signed)
Established problem. Stable. Continue current therapy  

## 2017-04-25 NOTE — Assessment & Plan Note (Signed)
Lab Results  Component Value Date   TSH 3.280 04/24/2017  Adequate LT4 replacement tx Continue LT4 100 mcg daily.  Recheck tx in 12 months.

## 2017-04-25 NOTE — Telephone Encounter (Signed)
Please call patient, laboratory evaluation showed mild elevated glucose, this could be due to timing of her blood sample and her last meal. Also mild elevated alkaline phosphate, which is about at her baseline,  Will continue monitoring her laboratory evaluations periodically

## 2017-04-25 NOTE — Assessment & Plan Note (Signed)
Established problem. Stable. No progression nor exacerbations.  Continue current therapy of Tecfidera and Ampyra Lab monitoring for ADE were unremarkable.  Labs forwarded to Dr Gordy Clement. Followed at [facility] Neuro Dr Gordy Clement in end of year. Marland Kitchen

## 2017-04-28 NOTE — Telephone Encounter (Signed)
Spoke to patient she is aware of results

## 2017-05-16 ENCOUNTER — Other Ambulatory Visit: Payer: Self-pay | Admitting: Family Medicine

## 2017-06-09 ENCOUNTER — Telehealth: Payer: Self-pay

## 2017-06-09 NOTE — Telephone Encounter (Signed)
Ampyra PA started fax to [facility] at [phone]. Fax receive. Pending 3 to 5 business days for approval.

## 2017-06-10 NOTE — Telephone Encounter (Signed)
Ampyra ER 10 mg approve tru 06/09/2019 from [place].

## 2017-06-19 ENCOUNTER — Other Ambulatory Visit: Payer: Self-pay | Admitting: *Deleted

## 2017-06-20 NOTE — Telephone Encounter (Signed)
2nd request.  Martin, Tamika L, RN  

## 2017-06-23 ENCOUNTER — Telehealth: Payer: Self-pay | Admitting: Neurology

## 2017-06-23 ENCOUNTER — Other Ambulatory Visit: Payer: Self-pay | Admitting: *Deleted

## 2017-06-23 MED ORDER — AMLODIPINE BESYLATE 10 MG PO TABS: 10.0000 mg | ORAL_TABLET | Freq: Every day | ORAL | 3 refills | Status: DC

## 2017-06-23 MED ORDER — DALFAMPRIDINE ER 10 MG PO TB12: 10.0000 mg | ORAL_TABLET | Freq: Two times a day (BID) | ORAL | 3 refills | Status: DC

## 2017-06-23 NOTE — Telephone Encounter (Signed)
[employer] requesting rx to be resent.  This has been completed.

## 2017-06-23 NOTE — Telephone Encounter (Signed)
Previous message sent in error, it was not properly routed.  Pt: received a call from  [place], [place] [phone] (Phone) [phone] (Fax)    re: dalfampridine (AMPYRA) 10 MG TB12 , they are in need of a prescription.

## 2017-06-23 NOTE — Telephone Encounter (Signed)
Pt: received a call from  [place], [place] [phone] (Phone) [phone] (Fax)    re: dalfampridine (AMPYRA) 10 MG TB12 , they are in need of a prescription.

## 2017-06-25 ENCOUNTER — Ambulatory Visit: Payer: Commercial Managed Care - HMO | Admitting: Neurology

## 2017-07-16 ENCOUNTER — Ambulatory Visit (INDEPENDENT_AMBULATORY_CARE_PROVIDER_SITE_OTHER): Payer: Medicare HMO | Admitting: Neurology

## 2017-07-16 ENCOUNTER — Encounter: Payer: Self-pay | Admitting: Neurology

## 2017-07-16 VITALS — BP 157/91 | HR 86 | Ht 64.0 in | Wt 197.0 lb

## 2017-07-16 DIAGNOSIS — G35 Multiple sclerosis: Secondary | ICD-10-CM | POA: Diagnosis not present

## 2017-07-16 DIAGNOSIS — G8929 Other chronic pain: Secondary | ICD-10-CM | POA: Insufficient documentation

## 2017-07-16 DIAGNOSIS — M25569 Pain in unspecified knee: Secondary | ICD-10-CM | POA: Insufficient documentation

## 2017-07-16 MED ORDER — CELECOXIB 100 MG PO CAPS: 100.0000 mg | ORAL_CAPSULE | Freq: Two times a day (BID) | ORAL | 11 refills | Status: DC

## 2017-07-16 NOTE — Progress Notes (Signed)
[facility]  PATIENT: Mariah Jackson DOB: [birth year]  HISTORY OF PRESENT ILLNESS: HISTORY: Mariah Jackson, 56 year old female returns for followup for RRMS  She was diagnosed with multiple sclerosis in 2001, by Dr. Dellis Filbert at [facility]. The disease has primarily affected her balance and gait. She could only clarify one exacerbation, presenting as worsening balance difficulty, and says she has never been treated with intravenous steroids.   She was placed on Betaseron shortly after diagnosis, and has been on that drug ever since. Her gait difficulty had little changes over the  years. She ambulates with a cane, but is able to get around the house pretty well without it.   Most recent MRI was in January 2011,shows multiple periventricular, periatrial and subcortical white matter hyperintensities suggestive of demyelinating disease.No enhancing lesions or significant atrophy is seen. compared to MRI films from Aug 2000 there appear to be more T2 lesions in right frontal subcortical region.   She is tired of taking the shots and does have multiple areas of lipoatrophy, especially on the abdominal area. She is ambulating with a cane. She  switched to Tecfidera since Oct 2014, doing well.  UPDATE Sep 18th 2015: She denies signficant side effect from Tecfidera. She has been on Ampyra 10mg  bid for few years, which definitely has helped her walking  MRI brain March 2014: Multiple periventricular and subcortical chronic demyelinating disease. No acute plaques are seen. MRI cervical spine: Disc bulging at C5-6. Spondylosis at C5-6 and C6-7. C5-6 there is focal disc protrusion slightly deforms anterior spinal cord, with spinal stenosis or foraminal stenosis. No intrinsic or enhancing spinal cord lesions.  UPDATE March 17th 2016: She fell in Feb 2016, now with right shoulder and neck pain, overall has improved, she is continue taking Tecfidera, Ampyra, denies significant side effect,  ambulate with spastic gait, wear her right ankle brace.  UPDATE July 16 2018: She is overall doing well, no flareup, continue have gait abnormality, complains of bilateral knee pain, has valgrus, reviewed laboratory evaluations, normal CMP, elevated alkaline phosphate 151, mild elevated glucose 101, CBC showed low MCV 75, normal TSH   REVIEW OF SYSTEMS: Full 14 system review of systems performed and notable only for those listed, all others are neg:     Gait abnormality, bilateral knee pain, leg swelling, restless leg, joint pain, swelling achy muscles, walking difficulty, numbness   ALLERGIES: Allergies  Allergen Reactions  . Vicodin [Hydrocodone-Acetaminophen] Nausea Only    HOME MEDICATIONS: Outpatient Medications Prior to Visit  Medication Sig Dispense Refill  . amLODipine (NORVASC) 10 MG tablet Take 1 tablet (10 mg total) by mouth daily. 90 tablet 3  . aspirin EC 81 MG tablet Take 1 tablet (81 mg total) by mouth daily. 90 tablet 3  . dalfampridine (AMPYRA) 10 MG TB12 Take 1 tablet (10 mg total) by mouth every 12 (twelve) hours. 180 tablet 3  . Dimethyl Fumarate (TECFIDERA) 240 MG CPDR Take 1 capsule (240 mg total) by mouth 2 (two) times daily. 180 capsule 1  . ibuprofen (ADVIL,MOTRIN) 600 MG tablet Take 1 tablet (600 mg total) by mouth every 8 (eight) hours as needed. 60 tablet 3  . levothyroxine (SYNTHROID, LEVOTHROID) 100 MCG tablet TAKE 1 TABLET EVERY DAY BEFORE BREAKFAST 90 tablet 3  . metoprolol tartrate (LOPRESSOR) 25 MG tablet Take 0.5 tablets (12.5 mg total) by mouth 2 (two) times daily. 90 tablet 3  . Multiple Vitamin (MULTIVITAMIN) tablet Take 1 tablet by mouth daily.    Marland Kitchen omeprazole (PRILOSEC) 20  MG capsule TAKE 1 CAPSULE EVERY DAY 90 capsule 1  . rosuvastatin (CRESTOR) 10 MG tablet TAKE 1 TABLET EVERY DAY 90 tablet 1   No facility-administered medications prior to visit.     PAST MEDICAL HISTORY: Past Medical History:  Diagnosis Date  . Abnormality of gait  09/10/2013  . Arthritis   . Breast cancer ([place]) 2007   left  . 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 - SEE ONCOLOGY TABLE.  COMMENT ONCOLOGY TABLE-BREAST, IN SITU CARCINOMA ONLY  . Essential hypertension 11/09/2014  . GERD (gastroesophageal reflux disease)   . History of breast cancer 12/30/2005  . History of breast lump/mass excision 11/09/2014   Lumpectomy 2007. Pathology report non-cancerous, fibrous tissue.    Marland Kitchen Hyperlipidemia   . Hypertension   . Hypothyroidism   . Multinodular goiter (nontoxic) 08/29/2016  . Multiple sclerosis ([place])   . Multiple sclerosis, relapsing-remitting ([place]) 09/10/2013  . Pure hypercholesterolemia 11/09/2014  . Thyroid enlargement 11/09/2014   Right sided thyroid enlargement      PAST SURGICAL HISTORY: Past Surgical History:  Procedure Laterality Date  . BREAST LUMPECTOMY  2007   left, radiation  . TUBAL LIGATION  1985    FAMILY HISTORY: Family History  Problem Relation Age of Onset  . Stroke Father   . Hypertension Mother   . Hypertension Sister   . Hypertension Brother   . Diabetes Brother 52       Prediabetic  . Hypertension Brother   . Hypertension Sister   . Colon cancer Neg Hx     SOCIAL HISTORY: Social History   Social History  . Marital status: Divorced    Spouse name: N/A  . Number of children: 2  . Years of education: 12   Occupational History  . Unemployed    . Disabiliy    Social History Main Topics  . Smoking status: Never Smoker  . Smokeless tobacco: Never Used  . Alcohol use No  . Drug use: No  . Sexual activity: No   Other Topics Concern  . Not on file   Social History Narrative   Patient a high school education.    Patient is unemployed.   Patient has 2 children.  Patient lives in her home with daughter and son. Pets: 3 dogs.  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    Patient is divorced.    Mobility:  cane   Falls: none   Pain: none    Depression: no   Transportation: SCAT   Caregiver: daughter    Emergency Contact: daughter, Mariah Arbour, CNA   Advance Directive:  None   Resources:   -Financial payment plan with the neurology office.   -[place] program participate for MS which covers some medical expense for patient.         DME: cane, walker, BP monitor (digital), partial plate (upper), eyeglasses     PHYSICAL EXAM  Vitals:   07/16/17 1012  BP: (!) 157/91  Pulse: 86  Weight: 197 lb (89.4 kg)  Height: 5\' 4"  (1.626 m)   Body mass index is 33.81 kg/m.  PHYSICAL EXAMNIATION:  Gen: NAD, conversant, well nourised, obese, well groomed                     Cardiovascular: Regular  rate rhythm, no peripheral edema, warm, nontender. Eyes: Conjunctivae clear without exudates or hemorrhage Neck: Supple, no carotid bruits. Pulmonary: Clear to auscultation bilaterally   NEUROLOGICAL EXAM:  MENTAL STATUS: Speech:    Speech is normal; fluent and spontaneous with normal comprehension.  Cognition:     Orientation to time, place and person     Normal recent and remote memory     Normal Attention span and concentration     Normal Language, naming, repeating,spontaneous speech     Fund of knowledge   CRANIAL NERVES: CN II: Visual fields are full to confrontation. Fundoscopic exam is normal with sharp discs and no vascular changes. Pupils are round equal and briskly reactive to light. CN III, IV, VI: extraocular movement are normal. No ptosis. CN V: Facial sensation is intact to pinprick in all 3 divisions bilaterally. Corneal responses are intact.  CN VII: Face is symmetric with normal eye closure and smile. CN VIII: Hearing is normal to rubbing fingers CN IX, X: Palate elevates symmetrically. Phonation is normal. CN XI: Head turning and shoulder shrug are intact CN XII: Tongue is  midline with normal movements and no atrophy.  MOTOR: There is no pronator drift of out-stretched arms. Muscle bulk and tone are normal. Muscle strength is normal.  REFLEXES: Reflexes are 2+ and symmetric at the biceps, triceps, knees, and ankles. Plantar responses are flexor.  SENSORY: Intact to light touch, pinprick, positional and vibratory sensation are intact in fingers and toes.  COORDINATION: Rapid alternating movements and fine finger movements are intact. There is no dysmetria on finger-to-nose and heel-knee-shin.    GAIT/STANCE: She rely on Marcaine to get up from seated position, bilateral valgrus, unsteady, cautious gait   DIAGNOSTIC DATA (LABS, IMAGING, TESTING) - I reviewed patient records, labs, notes, testing and imaging myself where available.      Component Value Date/Time   NA 144 04/24/2017 1110   K 3.7 04/24/2017 1110   CL 104 04/24/2017 1110   CO2 24 04/24/2017 1110   GLUCOSE 101 (H) 04/24/2017 1110   GLUCOSE 91 08/29/2016 1227   BUN 10 04/24/2017 1110   CREATININE 0.82 04/24/2017 1110   CREATININE 0.82 08/29/2016 1227   CALCIUM 9.9 04/24/2017 1110   PROT 7.5 04/24/2017 1110   ALBUMIN 4.7 04/24/2017 1110   AST 20 04/24/2017 1110   ALT 15 04/24/2017 1110   ALKPHOS 151 (H) 04/24/2017 1110   BILITOT 0.6 04/24/2017 1110   GFRNONAA 79 04/24/2017 1110   GFRNONAA 80 08/29/2016 1227   GFRAA 91 04/24/2017 1110   GFRAA >89 08/29/2016 1227    ASSESSMENT AND PLAN  Relapsing remitting multiple sclerosis  Repeat MRI of the brain with and without contrast  Laboratory evaluations  Continue Tecfidera  Gait abnormality, bilateral knee pain,  Continue ampyra  Celebrex 100mg  prn, she has tried ibuprofen 600 mg without helping her symptoms  Marcial Pacas, M.D. Ph.D.  [facility] [place], [place] [address] Phone: [phone] Fax:      [phone]

## 2017-07-28 ENCOUNTER — Other Ambulatory Visit: Payer: Self-pay | Admitting: Family Medicine

## 2017-07-28 DIAGNOSIS — E038 Other specified hypothyroidism: Secondary | ICD-10-CM

## 2017-07-29 MED ORDER — LEVOTHYROXINE SODIUM 100 MCG PO TABS: 100.0000 ug | ORAL_TABLET | Freq: Every day | ORAL | 3 refills | Status: DC

## 2017-08-05 ENCOUNTER — Other Ambulatory Visit: Payer: Medicare HMO

## 2017-08-10 ENCOUNTER — Other Ambulatory Visit: Payer: Self-pay | Admitting: Nurse Practitioner

## 2017-08-15 ENCOUNTER — Ambulatory Visit
Admission: RE | Admit: 2017-08-15 | Discharge: 2017-08-15 | Disposition: A | Discharge status: Home / Self Care | Payer: Medicare HMO | Source: Ambulatory Visit | Attending: Neurology | Admitting: Neurology

## 2017-08-15 DIAGNOSIS — M25569 Pain in unspecified knee: Secondary | ICD-10-CM

## 2017-08-15 DIAGNOSIS — G35 Multiple sclerosis: Secondary | ICD-10-CM

## 2017-08-15 MED ORDER — GADOBENATE DIMEGLUMINE 529 MG/ML IV SOLN
17.0000 mL | Freq: Once | INTRAVENOUS | Status: AC | PRN
  Administered 2017-08-15: 17 mL via INTRAVENOUS

## 2017-08-18 ENCOUNTER — Telehealth: Payer: Self-pay | Admitting: Neurology

## 2017-08-18 NOTE — Telephone Encounter (Signed)
Please get her MRI CD from our mobile unit for me to compare   IMPRESSION:  This MRI of the brain with and without contrast shows the following: 1.    There are multiple T2/FLAIR hyperintense foci in the white matter of both hemispheres with large confluencies in the periatrial white matter bilaterally. The pattern is nonspecific and could be seen with chronic microvascular ischemic change or demyelination. There are no infratentorial lesions noted. 2.    There is a normal enhancement pattern and there are no acute findings.

## 2017-08-19 NOTE — Telephone Encounter (Addendum)
Spoke to patient - she is aware that we will call her back to discuss her MRI results once Dr. Krista Blue has reviewed it.  Also, she tried Celebrex and it was not helpful.  She is describing pain in her calves being tight and knotted - more like a muscle spasm.  She is taking ibuprofen right now but not getting much relief.  She would like to try another medication.

## 2017-08-19 NOTE — Telephone Encounter (Signed)
Pt called the clinic requesting MRI results. I explained the imaging has not been read yet but she would be contacted when Dr Krista Blue has results.  Pt also said celecoxib (CELEBREX) 100 MG capsule is not helping with the pain. She stopped taking it about 1 week ago. Said she is taking ibuprofen 200mg - 3 tabs every day and 3 tabs at night and it is helping. Please call

## 2017-08-20 MED ORDER — GABAPENTIN 300 MG PO CAPS: ORAL_CAPSULE | ORAL | 0 refills | Status: DC

## 2017-08-20 NOTE — Telephone Encounter (Signed)
Left message on both home and mobile numbers requesting a call back.   1) Need to discuss MRI results 2) Need to to discuss pain medication (Dr. Krista Blue is offering gabapentin 300mg , TID).

## 2017-08-20 NOTE — Telephone Encounter (Signed)
Please call patient I personally reviewed MRI compared to her previous MRI films in 2014, there is continued evidence of multiple sclerosis, there was no significant changes.

## 2017-08-20 NOTE — Telephone Encounter (Signed)
Attempted call to patient again and was able to speak with her.  She is aware of the MRI results and agreeable to try gabapentin.  Rx sent to her requested pharmacy.

## 2017-08-20 NOTE — Addendum Note (Signed)
Addended by: Desmond Lope on: 08/20/2017 05:53 PM   Modules accepted: Orders

## 2017-08-26 ENCOUNTER — Telehealth: Payer: Self-pay | Admitting: Neurology

## 2017-08-26 ENCOUNTER — Encounter: Payer: Self-pay | Admitting: *Deleted

## 2017-08-26 MED ORDER — DULOXETINE HCL 60 MG PO CPEP: 60.0000 mg | ORAL_CAPSULE | Freq: Every day | ORAL | 11 refills | Status: DC

## 2017-08-26 NOTE — Telephone Encounter (Signed)
Spoke to patient - she is experiencing intolerable dizziness with gabapentin and has stopped taking it.  Dr. Krista Blue has offered her a prescription of duloxetine 60mg , one tablet daily.  Patient agreeable to try the new medication.  Rx sent to pharmacy.

## 2017-08-26 NOTE — Addendum Note (Signed)
Addended by: Noberto Retort C on: 08/26/2017 02:04 PM   Modules accepted: Orders

## 2017-08-26 NOTE — Telephone Encounter (Signed)
Pt has called back and is asking for a returned call from [employer]

## 2017-08-26 NOTE — Telephone Encounter (Signed)
Pt calling re: the gabapentin (NEURONTIN) 300 MG capsule, she states it makes her feel light headed and does not want to take it anymore.  Pt is asking to be called back

## 2017-08-26 NOTE — Telephone Encounter (Signed)
Left message requesting a return call.

## 2017-10-03 NOTE — Telephone Encounter (Signed)
ERROR

## 2017-10-23 ENCOUNTER — Other Ambulatory Visit: Payer: Self-pay | Admitting: Family Medicine

## 2017-10-23 DIAGNOSIS — Z1231 Encounter for screening mammogram for malignant neoplasm of breast: Secondary | ICD-10-CM

## 2017-10-30 ENCOUNTER — Ambulatory Visit: Payer: Medicare HMO | Admitting: Family Medicine

## 2017-11-13 ENCOUNTER — Ambulatory Visit: Payer: Medicare HMO

## 2017-11-19 ENCOUNTER — Ambulatory Visit
Admission: RE | Admit: 2017-11-19 | Discharge: 2017-11-19 | Disposition: A | Discharge status: Home / Self Care | Payer: Medicare HMO | Source: Ambulatory Visit | Attending: Family Medicine | Admitting: Family Medicine

## 2017-11-19 DIAGNOSIS — Z1231 Encounter for screening mammogram for malignant neoplasm of breast: Secondary | ICD-10-CM

## 2017-11-19 HISTORY — DX: Personal history of irradiation: Z92.3

## 2017-12-03 ENCOUNTER — Telehealth: Payer: Self-pay | Admitting: *Deleted

## 2017-12-03 NOTE — Telephone Encounter (Signed)
Patient left message on nurse line requesting med for cough and sinus pressure. Returned patient's call. 2-3 day hx of non-productive cough, nasal congestion and pressure behind eyes. Has been taking "nighttime cold med" with relief. Explained that this is most likely viral at this time. Recommended rest, plenty of fluids, nasal saline lavage (has done in past with success). Continue taking cold medication for symptom relief. Patient knows to call office if develops fever or discolored drainage. Patient appreciative of call. Hubbard Hartshorn, RN, BSN

## 2017-12-08 ENCOUNTER — Other Ambulatory Visit: Payer: Self-pay | Admitting: Family Medicine

## 2017-12-31 ENCOUNTER — Telehealth: Payer: Self-pay | Admitting: *Deleted

## 2017-12-31 NOTE — Telephone Encounter (Signed)
PA initiated for Tecfidera through [place] [phone]) - pt VG#[id number].  Decision pending.

## 2018-01-05 NOTE — Telephone Encounter (Signed)
PA for Tecfidera approved through 01/01/2020.

## 2018-01-19 ENCOUNTER — Ambulatory Visit: Payer: Self-pay | Admitting: Neurology

## 2018-02-05 ENCOUNTER — Encounter: Payer: Self-pay | Admitting: Family Medicine

## 2018-02-05 ENCOUNTER — Ambulatory Visit (INDEPENDENT_AMBULATORY_CARE_PROVIDER_SITE_OTHER): Payer: Medicare HMO | Admitting: Family Medicine

## 2018-02-05 ENCOUNTER — Other Ambulatory Visit: Payer: Self-pay

## 2018-02-05 VITALS — BP 132/70 | HR 99 | Temp 98.0°F | Ht 64.0 in | Wt 189.0 lb

## 2018-02-05 DIAGNOSIS — M25569 Pain in unspecified knee: Secondary | ICD-10-CM

## 2018-02-05 DIAGNOSIS — Z79899 Other long term (current) drug therapy: Secondary | ICD-10-CM

## 2018-02-05 DIAGNOSIS — Z124 Encounter for screening for malignant neoplasm of cervix: Secondary | ICD-10-CM

## 2018-02-05 DIAGNOSIS — Z23 Encounter for immunization: Secondary | ICD-10-CM

## 2018-02-05 DIAGNOSIS — E042 Nontoxic multinodular goiter: Secondary | ICD-10-CM | POA: Diagnosis not present

## 2018-02-05 DIAGNOSIS — E038 Other specified hypothyroidism: Secondary | ICD-10-CM | POA: Diagnosis not present

## 2018-02-05 DIAGNOSIS — I10 Essential (primary) hypertension: Secondary | ICD-10-CM | POA: Diagnosis not present

## 2018-02-05 DIAGNOSIS — G35 Multiple sclerosis: Secondary | ICD-10-CM

## 2018-02-05 DIAGNOSIS — E7800 Pure hypercholesterolemia, unspecified: Secondary | ICD-10-CM

## 2018-02-05 MED ORDER — CELECOXIB 200 MG PO CAPS: 200.0000 mg | ORAL_CAPSULE | Freq: Two times a day (BID) | ORAL | 99 refills | Status: DC

## 2018-02-05 NOTE — Assessment & Plan Note (Signed)
Adequate blood pressure control.  No evidence of new end organ damage.  Tolerating medication without significant adverse effects.  Plan to continue blood pressure regiment of Amlodipine without metoprolol.

## 2018-02-05 NOTE — Assessment & Plan Note (Signed)
Established problem Controlled Continue current therapy regiment by Dr Krista Blue (Neuro) Checking ADE monitoring labs.

## 2018-02-05 NOTE — Patient Instructions (Addendum)
Restart taking the Duloxetine daily.  It makes a low level of constant pain control.  It takes a few weeks to really start helping.   Restart Celecoxib (Celebrex) twice a day if you needed it for muscle or knee pain.  Do not take Ibuprofen if you are taking celecoxib. They are like cousin medications.  Dr Codi Kertz will set up an appointment in our [facility] clinic to have you Pap smear in our gynecology chair.  This chair should make it easier on you to lie down for the gynecology exam.    We are checking blood work to keep an eye out for problems from your MS medications.

## 2018-02-05 NOTE — Assessment & Plan Note (Signed)
Symptomatically stable.  Monitoring TSH pending on LT4 100 mcg daily

## 2018-02-05 NOTE — Assessment & Plan Note (Signed)
Established problem Analgesia not controlled on prn APAP and Ibuprofen prn Start celecoxib 200 mg BID prn. Stop Ibuprofen Restart daily Duloxetine.  RTC 3 m

## 2018-02-05 NOTE — Assessment & Plan Note (Signed)
Last Pap smear over 10 yrs ago.  Normal pap.  Not repeated bc difficulty tolerating lithotomy position from M. Sclerosis.  Referral to [facility] for pap, possibliy in [place] exam chair.

## 2018-02-05 NOTE — Progress Notes (Signed)
Subjective:    Patient ID: Mariah Jackson, female    DOB: 03-Sep-1961, 57 y.o.   MRN: [id number] Mariah Jackson is alone Sources of clinical information for visit is/are patient. Nursing assessment for this office visit was reviewed with the patient for accuracy and revision.  Previous Report(s) Reviewed: historical medical records, notes from Dr Rhea Belton office  Depression screen [facility] 2/9 02/05/2018  Decreased Interest 0  Down, Depressed, Hopeless 0  PHQ - 2 Score 0   Fall Risk  02/05/2018 01/02/2017 12/02/2016 10/18/2016 07/09/2016  Falls in the past year? Yes No No No No  Number falls in past yr: 1 - - - -  Injury with Fall? No - - - -  Risk for fall due to : - - Impaired mobility Impaired balance/gait;Other (Comment) -  Risk for fall due to: Comment - - Cane uses a cane due to MS. -    HPI  Problem List Items Addressed This Visit      Medium   Essential hypertension - Primary CHRONIC HYPERTENSION  Disease Monitoring  Blood pressure range: not checking at home  Chest pain: no   Dyspnea: no   Claudication: no   Medication compliance: yes, taking amlodipine, not taking metoprolol  Medication Side Effects  Lightheadedness: no   Urinary frequency: no   Edema: no     Preventitive Healthcare:  Exercise: yes, active in caring for her 6 year old mother, no formal exercise   Diet Pattern: fair  Salt Restriction: no     Hypothyroidism Hypothyroidism Longstanding issue for patient Patient presents for evaluation of thyroid function.  Symptoms consist of denies fatigue, weight changes, heat/cold intolerance, bowel/skin changes or CVS symptoms.  The problem has been unchanged.   Previous thyroid studies include TSH.  The hypothyroidism is due to hypothyroidism and multiple thyroid nodule gotier.      Low   Pure hypercholesterolemia - taking crestor.  No RUQ pain,  No jaundice     knee osteoarthritis  Knee osteoarthritis  with knee pain involving both knees. Onset was years  ago. Inciting event: none known. Current symptoms include: stiffness. Pain is aggravated by rising after sitting. Patient has had prior knee problems. Evaluation to date: none. Treatment to date: OTC analgesics which are somewhat effective. Takes Ibuprofen 400 mg two to three times a week with fair relief.  Reports better relief in past with celebrex.  APAP not helping. Not taking prescribed Duloxetine bc she thought it was to be used prn.      Relevant Medications   celecoxib (CELEBREX) 200 MG capsule    Other Visit Diagnoses    Need for immunization against influenza       Relevant Orders   Flu Vaccine QUAD 36+ mos IM (Completed)   High risk medications (not anticoagulants) long-term use       Relevant Orders   Comprehensive metabolic panel   CBC with Differential   Screening for cervical cancer     - Last Pap was over 10 years ago. Normal.  It was very difficult to remain in lithotomy position because of  Shaking and tremor of right leg.    Relevant Orders   Ambulatory referral to Women's Health     Smoking status reviewed   Review of Systems See HPI    Objective:   Physical Exam  VS reviewed GEN: Alert, Cooperative, Groomed, NAD HEENT: PERRL; EAC bilaterally not occluded, TM's translucent with normal LM, (+) LR;  No cervical LAN, mild thyromegaly, symmetri without mass COR: RRR, No M/G/R, No JVD, Normal PMI size and location LUNGS: BCTA, No Acc mm use, speaking in full sentences Left ankle in lace up ankle brace.  Gait: Normal speed, No significant path deviation, Step through +,  Psych: Normal affect/thought/speech/language    Assessment & Plan:

## 2018-02-06 LAB — COMPREHENSIVE METABOLIC PANEL
A/G RATIO: 1.6 (ref 1.2–2.2)
ALK PHOS: 141 IU/L — AB (ref 39–117)
ALT: 14 IU/L (ref 0–32)
AST: 17 IU/L (ref 0–40)
Albumin: 4.6 g/dL (ref 3.5–5.5)
BILIRUBIN TOTAL: 0.7 mg/dL (ref 0.0–1.2)
BUN/Creatinine Ratio: 15 (ref 9–23)
BUN: 12 mg/dL (ref 6–24)
CHLORIDE: 107 mmol/L — AB (ref 96–106)
CO2: 23 mmol/L (ref 20–29)
Calcium: 10.3 mg/dL — ABNORMAL HIGH (ref 8.7–10.2)
Creatinine, Ser: 0.8 mg/dL (ref 0.57–1.00)
GFR calc non Af Amer: 81 mL/min/{1.73_m2} (ref >59)
GFR, EST AFRICAN AMERICAN: 93 mL/min/{1.73_m2} (ref >59)
GLUCOSE: 92 mg/dL (ref 65–99)
Globulin, Total: 2.9 g/dL (ref 1.5–4.5)
POTASSIUM: 3.9 mmol/L (ref 3.5–5.2)
Sodium: 143 mmol/L (ref 134–144)
TOTAL PROTEIN: 7.5 g/dL (ref 6.0–8.5)

## 2018-02-06 LAB — CBC WITH DIFFERENTIAL/PLATELET
BASOS ABS: 0 10*3/uL (ref 0.0–0.2)
Basos: 1 %
EOS (ABSOLUTE): 0.1 10*3/uL (ref 0.0–0.4)
Eos: 3 %
HEMOGLOBIN: 14.2 g/dL (ref 11.1–15.9)
Hematocrit: 41 % (ref 34.0–46.6)
IMMATURE GRANS (ABS): 0 10*3/uL (ref 0.0–0.1)
Immature Granulocytes: 0 %
LYMPHS: 18 %
Lymphocytes Absolute: 0.7 10*3/uL (ref 0.7–3.1)
MCH: 26 pg — AB (ref 26.6–33.0)
MCHC: 34.6 g/dL (ref 31.5–35.7)
MCV: 75 fL — ABNORMAL LOW (ref 79–97)
MONOCYTES: 10 %
Monocytes Absolute: 0.4 10*3/uL (ref 0.1–0.9)
NEUTROS ABS: 2.6 10*3/uL (ref 1.4–7.0)
NEUTROS PCT: 68 %
Platelets: 235 10*3/uL (ref 150–379)
RBC: 5.46 x10E6/uL — AB (ref 3.77–5.28)
RDW: 15.1 % (ref 12.3–15.4)
WBC: 3.8 10*3/uL (ref 3.4–10.8)

## 2018-02-06 LAB — TSH: TSH: 2.71 u[IU]/mL (ref 0.450–4.500)

## 2018-02-10 ENCOUNTER — Encounter: Payer: Self-pay | Admitting: Obstetrics and Gynecology

## 2018-02-13 ENCOUNTER — Telehealth: Payer: Self-pay | Admitting: *Deleted

## 2018-02-13 NOTE — Telephone Encounter (Signed)
Patient wants to know the results of her labs from 02/05/18. Fleeger, Salome Spotted, CMA

## 2018-02-13 NOTE — Telephone Encounter (Signed)
Patient informed of results. Jazmin Hartsell,CMA  

## 2018-02-13 NOTE — Telephone Encounter (Signed)
Please let [name] know that her blood work was normal.

## 2018-03-16 ENCOUNTER — Ambulatory Visit: Payer: Medicare HMO | Admitting: Neurology

## 2018-03-16 ENCOUNTER — Encounter: Payer: Self-pay | Admitting: Neurology

## 2018-03-16 VITALS — BP 144/79 | HR 77 | Ht 64.0 in | Wt 193.0 lb

## 2018-03-16 DIAGNOSIS — G35 Multiple sclerosis: Secondary | ICD-10-CM

## 2018-03-16 NOTE — Progress Notes (Signed)
[facility]  PATIENT: Mariah Jackson DOB: 1961/12/26  HISTORY OF PRESENT ILLNESS: HISTORY: Mariah Jackson, 57 year old female returns for followup for RRMS  She was diagnosed with multiple sclerosis in 2001, by Dr. Dellis Filbert at [facility]. The disease has primarily affected her balance and gait. She could only clarify one exacerbation, presenting as worsening balance difficulty, and says she has never been treated with intravenous steroids.   She was placed on Betaseron shortly after diagnosis, and has been on that drug ever since. Her gait difficulty had little changes over the  years. She ambulates with a cane, but is able to get around the house pretty well without it.   Most recent MRI was in January 2011,shows multiple periventricular, periatrial and subcortical white matter hyperintensities suggestive of demyelinating disease.No enhancing lesions or significant atrophy is seen. compared to MRI films from Aug 2000 there appear to be more T2 lesions in right frontal subcortical region.   She is tired of taking the shots and does have multiple areas of lipoatrophy, especially on the abdominal area. She is ambulating with a cane. She  switched to Tecfidera since Oct 2014, doing well.  UPDATE Sep 18th 2015: She denies signficant side effect from Tecfidera. She has been on Ampyra 10mg  bid for few years, which definitely has helped her walking  MRI brain March 2014: Multiple periventricular and subcortical chronic demyelinating disease. No acute plaques are seen. MRI cervical spine: Disc bulging at C5-6. Spondylosis at C5-6 and C6-7. C5-6 there is focal disc protrusion slightly deforms anterior spinal cord, with spinal stenosis or foraminal stenosis. No intrinsic or enhancing spinal cord lesions.  UPDATE March 17th 2016: She fell in Feb 2016, now with right shoulder and neck pain, overall has improved, she is continue taking Tecfidera, Ampyra, denies significant side effect,  ambulate with spastic gait, wear her right ankle brace.  UPDATE July 16 2017: She is overall doing well, no flareup, continue have gait abnormality, complains of bilateral knee pain, has valgrus, reviewed laboratory evaluations, normal CMP, elevated alkaline phosphate 151, mild elevated glucose 101, CBC showed low MCV 75, normal TSH  Update March 16, 2018:  She is overall doing very well, no significant change, continue taking Ampyra, using of one-point cane, unsteady gait,  I have personally reviewed MRI of brain with and without contrast in August 2018, multiple T2/FLAIR hyperintensity lesions at both hemisphere, no contrast enhancement, there was no infratentorial lesions, no significant change compared to previous scans.  REVIEW OF SYSTEMS: Full 14 system review of systems performed and notable only for those listed, all others are neg:     Gait abnormality, bilateral knee pain, leg swelling, restless leg, joint pain, swelling achy muscles, walking difficulty, numbness   ALLERGIES: Allergies  Allergen Reactions  . Vicodin [Hydrocodone-Acetaminophen] Nausea Only    HOME MEDICATIONS: Outpatient Medications Prior to Visit  Medication Sig Dispense Refill  . amLODipine (NORVASC) 10 MG tablet Take 1 tablet (10 mg total) by mouth daily. 90 tablet 3  . aspirin EC 81 MG tablet Take 1 tablet (81 mg total) by mouth daily. 90 tablet 3  . celecoxib (CELEBREX) 200 MG capsule Take 1 capsule (200 mg total) by mouth 2 (two) times daily. 60 capsule prn  . dalfampridine (AMPYRA) 10 MG TB12 Take 1 tablet (10 mg total) by mouth every 12 (twelve) hours. 180 tablet 3  . levothyroxine (SYNTHROID, LEVOTHROID) 100 MCG tablet Take 1 tablet (100 mcg total) by mouth daily. 90 tablet 3  . Multiple Vitamin (MULTIVITAMIN)  tablet Take 1 tablet by mouth daily.    Marland Kitchen omeprazole (PRILOSEC) 20 MG capsule TAKE 1 CAPSULE EVERY DAY 90 capsule 3  . rosuvastatin (CRESTOR) 10 MG tablet TAKE 1 TABLET EVERY DAY 90 tablet 1    . TECFIDERA 240 MG CPDR TAKE 1 CAPSULE (240MG  TOTAL) BY MOUTH TWO TIMES DAILY 60 capsule 6  . DULoxetine (CYMBALTA) 60 MG capsule Take 1 capsule (60 mg total) by mouth daily. 30 capsule 11   No facility-administered medications prior to visit.     PAST MEDICAL HISTORY: Past Medical History:  Diagnosis Date  . Abnormality of gait 09/10/2013  . Arthritis   . Breast cancer ([place]) 2007   left  . 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 - SEE ONCOLOGY TABLE.  COMMENT ONCOLOGY TABLE-BREAST, IN SITU CARCINOMA ONLY  . Essential hypertension 11/09/2014  . GERD (gastroesophageal reflux disease)   . History of breast cancer 12/30/2005  . History of breast lump/mass excision 11/09/2014   Lumpectomy 2007. Pathology report non-cancerous, fibrous tissue.    Marland Kitchen Hyperlipidemia   . Hypertension   . Hypothyroidism   . Multinodular goiter (nontoxic) 08/29/2016  . Multiple sclerosis ([place])   . Multiple sclerosis, relapsing-remitting ([place]) 09/10/2013  . Personal history of radiation therapy   . Pure hypercholesterolemia 11/09/2014  . Thyroid enlargement 11/09/2014   Right sided thyroid enlargement      PAST SURGICAL HISTORY: Past Surgical History:  Procedure Laterality Date  . BREAST LUMPECTOMY  2007   left, radiation  . TUBAL LIGATION  1985    FAMILY HISTORY: Family History  Problem Relation Age of Onset  . Stroke Father   . Hypertension Mother   . Hypertension Sister   . Hypertension Brother   . Diabetes Brother 41       Prediabetic  . Hypertension Brother   . Hypertension Sister   . Colon cancer Neg Hx   . Breast cancer Neg Hx     SOCIAL HISTORY: Social History   Socioeconomic History  . Marital status: Divorced    Spouse name: Not on file  . Number of children: 2  . Years of education: 20  . Highest education level: Not on file  Social Needs  . Financial  resource strain: Not on file  . Food insecurity - worry: Not on file  . Food insecurity - inability: Not on file  . Transportation needs - medical: Not on file  . Transportation needs - non-medical: Not on file  Occupational History  . Occupation: Unemployed   . Occupation: Disabiliy  Tobacco Use  . Smoking status: Never Smoker  . Smokeless tobacco: Never Used  Substance and Sexual Activity  . Alcohol use: No  . Drug use: No  . Sexual activity: No  Other Topics Concern  . Not on file  Social History Narrative   Patient a high school education.    Patient is unemployed.   Patient has 2 children.  Patient lives in her home with daughter and son. Pets: 3 dogs.  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    Patient is divorced.    Mobility:  cane   Falls: none   Pain: none    Depression: no   Transportation: SCAT   Caregiver: daughter    Emergency Contact: daughter, [name], CNA   Advance  Directive:  None   Resources:   -Warehouse manager with the neurology office.   -[place] program participate for MS which covers some medical expense for patient.         DME: cane, walker, BP monitor (digital), partial plate (upper), eyeglasses     PHYSICAL EXAM  Vitals:   03/16/18 1558  BP: (!) 144/79  Pulse: 77  Weight: 193 lb (87.5 kg)  Height: 5\' 4"  (1.626 m)   Body mass index is 33.13 kg/m.  PHYSICAL EXAMNIATION:  Gen: NAD, conversant, well nourised, obese, well groomed                     Cardiovascular: Regular rate rhythm, no peripheral edema, warm, nontender. Eyes: Conjunctivae clear without exudates or hemorrhage Neck: Supple, no carotid bruits. Pulmonary: Clear to auscultation bilaterally   NEUROLOGICAL EXAM:  MENTAL STATUS: Speech:    Speech is normal; fluent and spontaneous with normal comprehension.  Cognition:     Orientation to time, place and person     Normal recent and  remote memory     Normal Attention span and concentration     Normal Language, naming, repeating,spontaneous speech     Fund of knowledge   CRANIAL NERVES: CN II: Visual fields are full to confrontation. Fundoscopic exam is normal with sharp discs and no vascular changes. Pupils are round equal and briskly reactive to light. CN III, IV, VI: extraocular movement are normal. No ptosis. CN V: Facial sensation is intact to pinprick in all 3 divisions bilaterally. Corneal responses are intact.  CN VII: Face is symmetric with normal eye closure and smile. CN VIII: Hearing is normal to rubbing fingers CN IX, X: Palate elevates symmetrically. Phonation is normal. CN XI: Head turning and shoulder shrug are intact CN XII: Tongue is midline with normal movements and no atrophy.  MOTOR: There is no pronator drift of out-stretched arms. Muscle bulk and tone are normal. Muscle strength is normal.  REFLEXES: Reflexes are 2+ and symmetric at the biceps, triceps, knees, and ankles. Plantar responses are flexor.  SENSORY: Intact to light touch, pinprick, positional and vibratory sensation are intact in fingers and toes.  COORDINATION: Rapid alternating movements and fine finger movements are intact. There is no dysmetria on finger-to-nose and heel-knee-shin.    GAIT/STANCE: She rely on Marcaine to get up from seated position, bilateral valgrus, unsteady, cautious gait   DIAGNOSTIC DATA (LABS, IMAGING, TESTING) - I reviewed patient records, labs, notes, testing and imaging myself where available.      Component Value Date/Time   NA 143 02/05/2018 0919   K 3.9 02/05/2018 0919   CL 107 (H) 02/05/2018 0919   CO2 23 02/05/2018 0919   GLUCOSE 92 02/05/2018 0919   GLUCOSE 91 08/29/2016 1227   BUN 12 02/05/2018 0919   CREATININE 0.80 02/05/2018 0919   CREATININE 0.82 08/29/2016 1227   CALCIUM 10.3 (H) 02/05/2018 0919   PROT 7.5 02/05/2018 0919   ALBUMIN 4.6 02/05/2018 0919   AST 17 02/05/2018  0919   ALT 14 02/05/2018 0919   ALKPHOS 141 (H) 02/05/2018 0919   BILITOT 0.7 02/05/2018 0919   GFRNONAA 81 02/05/2018 0919   GFRNONAA 80 08/29/2016 1227   GFRAA 93 02/05/2018 0919   GFRAA >89 08/29/2016 1227    ASSESSMENT AND PLAN  Relapsing remitting multiple sclerosis  Repeat MRI of the brain with and without contrast in August 2018 showed evidence of supratentorium MS lesions, no contrast  enhancement, no change compared to previous MRIs  laboratory evaluations in February 2019 showed normal WBC, absolute lymphocyte 0.7 x 10  continue Tecfidera  Gait abnormality, bilateral knee pain,  Continue ampyra  Celebrex 100mg  prn  I encouraged her moderate exercise  Marcial Pacas, M.D. Ph.D.  [facility] [place], [place] [address] Phone: [phone] Fax:      [phone]

## 2018-03-17 ENCOUNTER — Telehealth: Payer: Self-pay | Admitting: Neurology

## 2018-03-17 NOTE — Telephone Encounter (Signed)
3/19 LVM to schedule pt for a 1 yr follow up from appt on 3/18, pt was in a hurry due to transportation during check out and we were not able to schedule it. Pt needs 1 yr follow up with NP per Dr. Krista Blue

## 2018-03-17 NOTE — Telephone Encounter (Signed)
Pt returned call, was able to schedule appt for 03/23/19 with Dr. Krista Blue pt preferred to see the Dr over the NP. Pt willing to see NP if she had to.

## 2018-03-18 ENCOUNTER — Other Ambulatory Visit: Payer: Self-pay | Admitting: Family Medicine

## 2018-03-18 ENCOUNTER — Other Ambulatory Visit: Payer: Self-pay | Admitting: *Deleted

## 2018-03-18 MED ORDER — DIMETHYL FUMARATE 240 MG PO CPDR: DELAYED_RELEASE_CAPSULE | ORAL | 3 refills | Status: DC

## 2018-04-01 ENCOUNTER — Encounter: Payer: Medicare HMO | Admitting: Obstetrics and Gynecology

## 2018-04-10 DIAGNOSIS — H5213 Myopia, bilateral: Secondary | ICD-10-CM | POA: Diagnosis not present

## 2018-05-03 ENCOUNTER — Encounter: Payer: Self-pay | Admitting: Gastroenterology

## 2018-05-21 ENCOUNTER — Other Ambulatory Visit: Payer: Self-pay | Admitting: Family Medicine

## 2018-05-21 DIAGNOSIS — E038 Other specified hypothyroidism: Secondary | ICD-10-CM

## 2018-06-23 ENCOUNTER — Telehealth: Payer: Self-pay | Admitting: Neurology

## 2018-06-23 ENCOUNTER — Telehealth: Payer: Self-pay | Admitting: *Deleted

## 2018-06-23 ENCOUNTER — Other Ambulatory Visit: Payer: Self-pay | Admitting: *Deleted

## 2018-06-23 ENCOUNTER — Ambulatory Visit (INDEPENDENT_AMBULATORY_CARE_PROVIDER_SITE_OTHER): Payer: Medicare HMO | Admitting: Family Medicine

## 2018-06-23 ENCOUNTER — Other Ambulatory Visit: Payer: Self-pay

## 2018-06-23 VITALS — BP 140/80 | HR 78 | Temp 98.4°F | Ht 64.0 in | Wt 193.0 lb

## 2018-06-23 DIAGNOSIS — M79661 Pain in right lower leg: Secondary | ICD-10-CM | POA: Insufficient documentation

## 2018-06-23 MED ORDER — DALFAMPRIDINE ER 10 MG PO TB12
10.0000 mg | ORAL_TABLET | Freq: Two times a day (BID) | ORAL | 3 refills | Status: DC
Start: 2018-06-23 — End: 2019-03-22

## 2018-06-23 MED ORDER — OMEPRAZOLE 20 MG PO CPDR: 20.0000 mg | DELAYED_RELEASE_CAPSULE | Freq: Every day | ORAL | 3 refills | Status: DC

## 2018-06-23 MED ORDER — TROLAMINE SALICYLATE 10 % EX CREA: 1.0000 "application " | TOPICAL_CREAM | CUTANEOUS | 0 refills | Status: DC | PRN

## 2018-06-23 NOTE — Patient Instructions (Signed)
It was great to see you!  Our plans for today:  - Obtain ultrasound of right leg to assess for blood clot - Continue to use celebrex for pain. Try taking tylenol with it to help. You can also use aspercreme for relief. - Continue to use ice packs for relief. - If your ultrasound shows no clots and you are not having good relief with tylenol, Celebrex, and aspercreme, we can give you a steroid injection of your knee as your pain may be coming from arthritis.  Take care and seek immediate care sooner if you develop any concerns.   Dr. [facility]

## 2018-06-23 NOTE — Telephone Encounter (Signed)
Spoke to patient - reports her pain started two days ago.  It starts in the back of her knee radiating into her calf.  She denies swelling, redness or her leg feeling hot to touch. She has been trying to treat it today with Celebrex, ice and elevation which is providing some relief.  She was instructed to contact her PCP for further evaluation. She was agreeable to this plan and will call this morning.

## 2018-06-23 NOTE — Progress Notes (Signed)
   Subjective:   Patient ID: Mariah Jackson    DOB: 02/24/1961, 57 y.o. female   MRN: [id number]  Kadijah Shamoon is a 57 y.o. female with a history of HTN, hypothyroidism, MS, OA here for   Knee and calf pain - Endorses R knee and calf pain for the past couple days. States it feels like a spasm or a sprain, that her muscle "feels tight." States she got up yesterday and had difficulty walking and fell back on bed. States pain is behind her knee and down her calf when she stands or walks on it. She has tried ice packs, celebrex with some relief. Has tried staying off of her feet. She thinks it has been swollen but doesn't think it has been red or warm. - She has difficulty walking at baseline due to her MS and normally ambulates with a cane. Has had to use a walker the past few days. Endorses some missed doses of her tecfidera, no missed doses of ampyra. Last MRI 07/2017 with no significant change from previous. - Previously had arthritis in her ankle and thinks the pain may be from favoring her ankle. Also thinks she may have arthritis in her knee.  Has previously had steroid injections in her knee before which helped with pain. - No history of blood clots. Takes aspirin daily but hasn't taken in some time. Remote h/o breast cancer in 2007 with last mammogram in 10/2017, normal. - Denies trauma/falls, fevers, recent weight loss. - Lives at home with brother, son and his girlfriend, and two dogs.  Review of Systems:  Per HPI.  [place], medications and smoking status reviewed.  Objective:   BP 140/80   Pulse 78   Temp 98.4 F (36.9 C) (Oral)   Ht 5\' 4"  (1.626 m)   Wt 193 lb (87.5 kg)   SpO2 98%   BMI 33.13 kg/m  Vitals and nursing note reviewed.  General: well nourished, well developed, in no acute distress with non-toxic appearance CV: regular rate and rhythm without murmurs, rubs, or gallops, no lower extremity edema Lungs: clear to auscultation bilaterally with normal work of  breathing Skin: warm, dry, no rashes or lesions Extremities: warm and well perfused, normal tone. Some crepitus with R knee extension and flexion. Swelling noted to R knee with slight warmth. Tenderness to palpation of R calf. No difference in calf circumference (40cm bilaterally). MSK: ROM grossly intact, 5/5 LE strength bilaterally intact, gait slowed, ambulates with walker. Neuro: Alert and oriented, speech normal  Assessment & Plan:   Right calf pain Pain most likely due to arthritis pain given crepitus on exam, previous relief with steroids and relief with celebrex. However given tenderness on exam and some mild warmth and knee swelling will obtain doppler US to rule out DVT. Advised patient to use tylenol with celebrex and aspercreme with ice packs for continued relief. If still no relief with conservative treatment and negative [place], can RTC for joint injection.  No orders of the defined types were placed in this encounter.  Meds ordered this encounter  Medications  . trolamine salicylate (ASPERCREME) 10 % cream    Sig: Apply 1 application topically as needed for muscle pain.    Dispense:  85 g    Refill:  0    Rory Percy, DO PGY-1, [facility] 06/23/2018 3:32 PM

## 2018-06-23 NOTE — Telephone Encounter (Signed)
Patient states for the last 2 days her right leg from her knee to her calf is very painful and tight. It is very hard for her to walk. She tries to keep an ice pack on it. Is there something that can be called to pharmacy? She uses [facility] on [address]. Please call and discuss.

## 2018-06-23 NOTE — Telephone Encounter (Signed)
Pt has had pain in her knee and calf x 1 day.  It feels like a muscle tightness and hurts when she walks. It is slightly swollen but is not warm to the touch.  Spoke with Dr. Nori Riis who suggest an appt today.  Put on overflow, pt will be here @ 1:30. Trinidee Schrag, Salome Spotted, CMA

## 2018-06-23 NOTE — Assessment & Plan Note (Signed)
Pain most likely due to arthritis pain given crepitus on exam, previous relief with steroids and relief with celebrex. However given tenderness on exam and some mild warmth and knee swelling will obtain doppler US to rule out DVT. Advised patient to use tylenol with celebrex and aspercreme with ice packs for continued relief. If still no relief with conservative treatment and negative [place], can RTC for joint injection.

## 2018-06-24 ENCOUNTER — Ambulatory Visit (HOSPITAL_COMMUNITY)
Admission: RE | Admit: 2018-06-24 | Discharge: 2018-06-24 | Disposition: A | Discharge status: Home / Self Care | Payer: Medicare HMO | Source: Ambulatory Visit | Attending: Family Medicine | Admitting: Family Medicine

## 2018-06-24 ENCOUNTER — Telehealth: Payer: Self-pay

## 2018-06-24 DIAGNOSIS — M79661 Pain in right lower leg: Secondary | ICD-10-CM | POA: Insufficient documentation

## 2018-06-24 DIAGNOSIS — M7989 Other specified soft tissue disorders: Secondary | ICD-10-CM | POA: Diagnosis not present

## 2018-06-24 NOTE — Progress Notes (Signed)
Right lower extremity venous duplex completed. There is no evidence of a DVT or Baker's cyst. Toma Copier, RVS 6.26.2019, 9:40 AM

## 2018-06-24 NOTE — Telephone Encounter (Signed)
Spoke with patient and informed of negative doppler. Advised patient pain is likely arthritic in nature. She has not yet tried medication therapy we previously discussed during our visit, states pain is getting better. Advised if gets worse or does not respond well to conservative therapy to return to the clinic for possible steroid injection.  Rory Percy, DO PGY-1, [facility] 06/24/2018 4:35 PM

## 2018-06-24 NOTE — Telephone Encounter (Signed)
Received call from [place] in the vascular lab that Right LE venous study negative. Advised that patient can leave. Informed preceptor of result and will route note to ordering physician to make aware.  Danley Danker, RN [facility] RN)

## 2018-08-03 ENCOUNTER — Other Ambulatory Visit: Payer: Self-pay | Admitting: Family Medicine

## 2018-09-10 ENCOUNTER — Encounter: Payer: Medicare HMO | Admitting: Obstetrics & Gynecology

## 2018-09-29 DIAGNOSIS — Z0271 Encounter for disability determination: Secondary | ICD-10-CM

## 2018-10-08 ENCOUNTER — Ambulatory Visit (INDEPENDENT_AMBULATORY_CARE_PROVIDER_SITE_OTHER): Payer: Medicare HMO

## 2018-10-08 VITALS — BP 142/82 | HR 81 | Temp 98.7°F | Ht 64.0 in | Wt 191.8 lb

## 2018-10-08 DIAGNOSIS — Z23 Encounter for immunization: Secondary | ICD-10-CM

## 2018-10-08 DIAGNOSIS — Z1211 Encounter for screening for malignant neoplasm of colon: Secondary | ICD-10-CM

## 2018-10-08 DIAGNOSIS — Z0000 Encounter for general adult medical examination without abnormal findings: Secondary | ICD-10-CM

## 2018-10-08 NOTE — Progress Notes (Signed)
Subjective:   Mariah Jackson is a 57 y.o. female who presents for Medicare Annual (Subsequent) preventive examination. The patient was informed that the wellness visit is to identify future health risk and educate and initiate measures that can reduce risk for increased disease through the lifespan.   Review of Systems:  Physical assessment deferred to PCP.  Cardiac Risk Factors include: hypertension;sedentary lifestyle;obesity (BMI >30kg/m2)    Objective:    Vitals: BP (!) 142/82   Pulse 81   Temp 98.7 F (37.1 C) (Oral)   Ht 5\' 4"  (1.626 m)   Wt 191 lb 12.8 oz (87 kg)   SpO2 98%   BMI 32.92 kg/m   Body mass index is 32.92 kg/m.  Advanced Directives 10/08/2018 06/23/2018 02/05/2018 04/24/2017 01/02/2017 12/02/2016 10/18/2016  Does Patient Have a Medical Advance Directive? No No No No No No No  Would patient like information on creating a medical advance directive? Yes (MAU/Ambulatory/Procedural Areas - Information given) No - Patient declined No - Patient declined No - Patient declined Yes (MAU/Ambulatory/Procedural Areas - Information given) Yes (MAU/Ambulatory/Procedural Areas - Information given) Yes - Educational materials given   Tobacco Social History   Tobacco Use  Smoking Status Never Smoker  Smokeless Tobacco Never Used  Tobacco Comment   no plans to start     Counseling given: Yes Comment: no plans to start  Clinical Intake:  Pre-visit preparation completed: Yes  Pain : 0-10 Pain Score: 7  Pain Type: Chronic pain Pain Location: Ankle Pain Orientation: Right Pain Relieving Factors: Celebrex, ankle brace  Pain Relieving Factors: Celebrex, ankle brace  Nutritional Status: BMI > 30  Obese Diabetes: No  How often do you need to have someone help you when you read instructions, pamphlets, or other written materials from your doctor or pharmacy?: 1 - Never What is the last grade level you completed in school?: 12th grade  Interpreter Needed?: No    Past  Medical History:  Diagnosis Date  . Abnormality of gait 09/10/2013  . Arthritis   . Breast cancer ([place]) 2007   left  . 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 - SEE ONCOLOGY TABLE.  COMMENT ONCOLOGY TABLE-BREAST, IN SITU CARCINOMA ONLY  . Essential hypertension 11/09/2014  . GERD (gastroesophageal reflux disease)   . History of breast cancer 12/30/2005  . History of breast lump/mass excision 11/09/2014   Lumpectomy 2007. Pathology report non-cancerous, fibrous tissue.    Marland Kitchen Hyperlipidemia   . Hypertension   . Hypothyroidism   . Multinodular goiter (nontoxic) 08/29/2016  . Multiple sclerosis ([place])   . Multiple sclerosis, relapsing-remitting ([place]) 09/10/2013  . Personal history of radiation therapy   . Pure hypercholesterolemia 11/09/2014  . Thyroid enlargement 11/09/2014   Right sided thyroid enlargement     Past Surgical History:  Procedure Laterality Date  . BREAST LUMPECTOMY  2007   left, radiation  . TUBAL LIGATION  1985   Family History  Problem Relation Age of Onset  . Stroke Father   . Hypertension Mother   . Hypertension Sister   . Hypertension Brother   . Diabetes Brother 44       Prediabetic  . Hypertension Brother   . Hypertension Brother   . Hypertension Brother   . Hypertension Brother   . Hypertension Sister   . Colon cancer Neg Hx   . Breast cancer Neg Hx  Social History   Socioeconomic History  . Marital status: Divorced    Spouse name: Not on file  . Number of children: 2  . Years of education: 61  . Highest education level: 12th grade  Occupational History  . Occupation: Unemployed   . Occupation: [employer]  . Financial resource strain: Not very hard  . Food insecurity:    Worry: Never true    Inability: Never true  . Transportation needs:    Medical: No    Non-medical: No  Tobacco Use  . Smoking status:  Never Smoker  . Smokeless tobacco: Never Used  . Tobacco comment: no plans to start  Substance and Sexual Activity  . Alcohol use: No  . Drug use: No  . Sexual activity: Not Currently  Lifestyle  . Physical activity:    Days per week: 7 days    Minutes per session: 20 min  . Stress: Not at all  Relationships  . Social connections:    Talks on phone: More than three times a week    Gets together: Once a week    Attends religious service: More than 4 times per year    Active member of club or organization: No    Attends meetings of clubs or organizations: Never    Relationship status: Widowed  Other Topics Concern  . Not on file  Social History Narrative   Patient a high school education.    Patient is unemployed.   Patient has 2 children.  Patient lives in her home with brother and son. One story home, ramp at back, 3-4 steps in front. Has grab bar at tub but not toilet. Pets: 2 dogs, Miss Honey and Miss [place].   Smoke alarms in home. Carpet in home, bath mats in bathroom.  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    Patient is divorced.    Eats small meals during day. Meat, veggies, fruit. Has trouble staying asleep. Wakes up about 4 am after going to bed at 1030 pm.   Mobility:  Cane or walker   Transportation: [facility] or family member   Caregiver: daughter    Emergency Contact: daughter, [name], CNA   Advance Directive:  None   Resources:   -Financial payment plan with the neurology office.   -[facility] program participate for MS which covers some medical expense for patient.         DME: cane, walker, BP monitor (digital), partial plate (upper), eyeglasses, ankle brace    Outpatient Encounter Medications as of 10/08/2018  Medication Sig  . amLODipine (NORVASC) 10 MG tablet TAKE 1 TABLET (10 MG TOTAL) BY MOUTH DAILY.  . celecoxib (CELEBREX) 200 MG capsule Take 1 capsule (200 mg total) by mouth 2  (two) times daily.  Marland Kitchen dalfampridine (AMPYRA) 10 MG TB12 Take 1 tablet (10 mg total) by mouth every 12 (twelve) hours.  . Dimethyl Fumarate (TECFIDERA) 240 MG CPDR TAKE 1 CAPSULE (240MG  TOTAL) BY MOUTH TWO TIMES DAILY  . levothyroxine (SYNTHROID, LEVOTHROID) 100 MCG tablet TAKE 1 TABLET EVERY DAY  . Multiple Vitamin (MULTIVITAMIN) tablet Take 1 tablet by mouth daily.  Marland Kitchen omeprazole (PRILOSEC) 20 MG capsule Take 1 capsule (20 mg total) by mouth daily.  . rosuvastatin (CRESTOR) 10 MG tablet TAKE 1 TABLET EVERY DAY  . trolamine salicylate (ASPERCREME) 10 % cream Apply 1 application topically as needed for muscle pain.  Marland Kitchen  aspirin EC 81 MG tablet Take 1 tablet (81 mg total) by mouth daily. (Patient not taking: Reported on 10/08/2018)   No facility-administered encounter medications on file as of 10/08/2018.     Activities of Daily Living In your present state of health, do you have any difficulty performing the following activities: 10/08/2018  Hearing? N  Vision? Y  Difficulty concentrating or making decisions? N  Walking or climbing stairs? Y  Comment MS. Does okay with something to hold onto  Dressing or bathing? N  Doing errands, shopping? N  Preparing Food and eating ? N  Using the Toilet? N  In the past six months, have you accidently leaked urine? N  Do you have problems with loss of bowel control? N  Managing your Medications? N  Managing your Finances? N  Housekeeping or managing your Housekeeping? N  Some recent data might be hidden    Patient Care Team: McDiarmid, Blane Ohara, MD as PCP - General (Family Medicine) Webb Laws, Carlton as Referring Physician (Optometry) Marcial Pacas, MD as Consulting Physician (Neurology) Emily Filbert, MD as Consulting Physician (Obstetrics and Gynecology)    Assessment:   This is a routine wellness examination for [facility].  Exercise Activities and Dietary recommendations Current Exercise Habits: Home exercise routine, Type of exercise:  stretching;walking, Time (Minutes): 15, Frequency (Times/Week): 7, Weekly Exercise (Minutes/Week): 105, Intensity: Mild, Exercise limited by: orthopedic condition(s);neurologic condition(s)  Goals    . Blood Pressure < 140/90          . Exercise 5x per week (15 min per time)     Walks up and down outdoor ramp and in yard.    . Weight < 186 lb (84.369 kg)     7% weight loss       Fall Risk Fall Risk  10/08/2018 02/05/2018 01/02/2017 12/02/2016 10/18/2016  Falls in the past year? Yes Yes No No No  Number falls in past yr: 1 1 - - -  Injury with Fall? No No - - -  Risk for fall due to : Impaired balance/gait;Impaired mobility - - Impaired mobility Impaired balance/gait;Other (Comment)  Risk for fall due to: Comment - - - Cane uses a cane due to MS.  Follow up Falls prevention discussed - - - -   Is the patient's home free of loose throw rugs in walkways, pet beds, electrical cords, etc?   yes      Grab bars in the bathroom? no      Handrails on the stairs?   yes      Adequate lighting?   yes  Depression Screen PHQ 2/9 Scores 10/08/2018 06/23/2018 02/05/2018 04/24/2017  PHQ - 2 Score 0 0 0 0    Cognitive Function MMSE - Mini Mental State Exam 10/08/2018  Orientation to time 5  Orientation to Place 5  Registration 3  Attention/ Calculation 5  Recall 3  Language- name 2 objects 2  Language- repeat 1  Language- follow 3 step command 3  Language- read & follow direction 1  Write a sentence 1  Copy design 1  Total score 30     6CIT Screen 10/08/2018  What Year? 0 points  What month? 0 points  What time? 0 points  Count back from 20 0 points  Months in reverse 0 points  Repeat phrase 0 points  Total Score 0    Immunization History  Administered Date(s) Administered  . Influenza,inj,Quad PF,6+ Mos 11/07/2014, 08/29/2016, 02/05/2018, 10/08/2018  . Tdap 05/08/2016  Flu vaccine given today.  Screening Tests Health Maintenance  Topic Date Due  . COLONOSCOPY  05/07/2018    . PAP SMEAR  10/16/2018 (Originally 01/22/1982)  . MAMMOGRAM  11/20/2019  . TETANUS/TDAP  05/08/2026  . INFLUENZA VACCINE  Completed  . Hepatitis C Screening  Completed  . HIV Screening  Completed    Cancer Screenings: Lung: Low Dose CT Chest recommended if Age 35-80 years, 30 pack-year currently smoking OR have quit w/in 15years. Patient does not qualify. Breast:  Up to date on Mammogram? Yes   Up to date of Bone Density/Dexa? Yes Colorectal: referral placed  Additional Screenings:  Hepatitis C Screening: complete Patient has appt with GYN, Dr Hulan Fray, for PAP smear on 10/16/18.    Plan:  Flu vaccine updated today. Referral placed for colonoscopy which is due this year. Patient overdue on Pap but has appt scheduled with Dr Hulan Fray on 10/16/18. Patient overdue for f/u with PCP. Appt made for 10/29/18 at 9:30 am.   I have personally reviewed and noted the following in the patient's chart:   . Medical and social history . Use of alcohol, tobacco or illicit drugs  . Current medications and supplements . Functional ability and status . Nutritional status . Physical activity . Advanced directives . List of other physicians . Hospitalizations, surgeries, and ER visits in previous 12 months . Vitals . Screenings to include cognitive, depression, and falls . Referrals and appointments  In addition, I have reviewed and discussed with patient certain preventive protocols, quality metrics, and best practice recommendations. A written personalized care plan for preventive services as well as general preventive health recommendations were provided to patient.     Esau Grew, RN  10/08/2018

## 2018-10-08 NOTE — Patient Instructions (Addendum)
Mariah Jackson , Thank you for taking time to come for your Medicare Wellness Visit. I appreciate your ongoing commitment to your health goals. Please review the following plan we discussed and let me know if I can assist you in the future.   Today we updated your flu vaccine. Keep appointment with Dr Hulan Fray on 10/16/18 for Pap smear. Keep appointment with Dr McDiarmid on 13/31/19 for follow up. You will get a phone call from [facility] to schedule your colonoscopy.  These are the goals we discussed: Goals    . Blood Pressure < 140/90          . Exercise 5x per week (15 min per time)     Walks up and down outdoor ramp and in yard.    . Weight < 186 lb (84.369 kg)     7% weight loss       This is a list of the screening recommended for you and due dates:  Health Maintenance  Topic Date Due  . Colon Cancer Screening  05/07/2018  . Pap Smear  10/16/2018*  . Mammogram  11/20/2019  . Tetanus Vaccine  05/08/2026  . Flu Shot  Completed  .  Hepatitis C: One time screening is recommended by Center for Disease Control  (CDC) for  adults born from [birth year] through 1967.   Completed  . HIV Screening  Completed  *Topic was postponed. The date shown is not the original due date.    Fall Prevention in the Home Falls can cause injuries. They can happen to people of all ages. There are many things you can do to make your home safe and to help prevent falls. What can I do on the outside of my home?  Regularly fix the edges of walkways and driveways and fix any cracks.  Remove anything that might make you trip as you walk through a door, such as a raised step or threshold.  Trim any bushes or trees on the path to your home.  Use bright outdoor lighting.  Clear any walking paths of anything that might make someone trip, such as rocks or tools.  Regularly check to see if handrails are loose or broken. Make sure that both sides of any steps have handrails.  Any raised decks and  porches should have guardrails on the edges.  Have any leaves, snow, or ice cleared regularly.  Use sand or salt on walking paths during winter.  Clean up any spills in your garage right away. This includes oil or grease spills. What can I do in the bathroom?  Use night lights.  Install grab bars by the toilet and in the tub and shower. Do not use towel bars as grab bars.  Use non-skid mats or decals in the tub or shower.  If you need to sit down in the shower, use a plastic, non-slip stool.  Keep the floor dry. Clean up any water that spills on the floor as soon as it happens.  Remove soap buildup in the tub or shower regularly.  Attach bath mats securely with double-sided non-slip rug tape.  Do not have throw rugs and other things on the floor that can make you trip. What can I do in the bedroom?  Use night lights.  Make sure that you have a light by your bed that is easy to reach.  Do not use any sheets or blankets that are too big for your bed. They should not hang down onto the floor.  Have a firm chair that has side arms. You can use this for support while you get dressed.  Do not have throw rugs and other things on the floor that can make you trip. What can I do in the kitchen?  Clean up any spills right away.  Avoid walking on wet floors.  Keep items that you use a lot in easy-to-reach places.  If you need to reach something above you, use a strong step stool that has a grab bar.  Keep electrical cords out of the way.  Do not use floor polish or wax that makes floors slippery. If you must use wax, use non-skid floor wax.  Do not have throw rugs and other things on the floor that can make you trip. What can I do with my stairs?  Do not leave any items on the stairs.  Make sure that there are handrails on both sides of the stairs and use them. Fix handrails that are broken or loose. Make sure that handrails are as long as the stairways.  Check any  carpeting to make sure that it is firmly attached to the stairs. Fix any carpet that is loose or worn.  Avoid having throw rugs at the top or bottom of the stairs. If you do have throw rugs, attach them to the floor with carpet tape.  Make sure that you have a light switch at the top of the stairs and the bottom of the stairs. If you do not have them, ask someone to add them for you. What else can I do to help prevent falls?  Wear shoes that: ? Do not have high heels. ? Have rubber bottoms. ? Are comfortable and fit you well. ? Are closed at the toe. Do not wear sandals.  If you use a stepladder: ? Make sure that it is fully opened. Do not climb a closed stepladder. ? Make sure that both sides of the stepladder are locked into place. ? Ask someone to hold it for you, if possible.  Clearly mark and make sure that you can see: ? Any grab bars or handrails. ? First and last steps. ? Where the edge of each step is.  Use tools that help you move around (mobility aids) if they are needed. These include: ? Canes. ? Walkers. ? Scooters. ? Crutches.  Turn on the lights when you go into a dark area. Replace any light bulbs as soon as they burn out.  Set up your furniture so you have a clear path. Avoid moving your furniture around.  If any of your floors are uneven, fix them.  If there are any pets around you, be aware of where they are.  Review your medicines with your doctor. Some medicines can make you feel dizzy. This can increase your chance of falling. Ask your doctor what other things that you can do to help prevent falls. This information is not intended to replace advice given to you by your health care provider. Make sure you discuss any questions you have with your health care provider. Document Released: 10/12/2009 Document Revised: 05/23/2016 Document Reviewed: 01/20/2015 Elsevier Interactive Patient Education  2018 Lead Maintenance, Female Adopting a  healthy lifestyle and getting preventive care can go a long way to promote health and wellness. Talk with your health care provider about what schedule of regular examinations is right for you. This is a good chance for you to check in with your provider about disease prevention and  staying healthy. In between checkups, there are plenty of things you can do on your own. Experts have done a lot of research about which lifestyle changes and preventive measures are most likely to keep you healthy. Ask your health care provider for more information. Weight and diet Eat a healthy diet  Be sure to include plenty of vegetables, fruits, low-fat dairy products, and lean protein.  Do not eat a lot of foods high in solid fats, added sugars, or salt.  Get regular exercise. This is one of the most important things you can do for your health. ? Most adults should exercise for at least 150 minutes each week. The exercise should increase your heart rate and make you sweat (moderate-intensity exercise). ? Most adults should also do strengthening exercises at least twice a week. This is in addition to the moderate-intensity exercise.  Maintain a healthy weight  Body mass index (BMI) is a measurement that can be used to identify possible weight problems. It estimates body fat based on height and weight. Your health care provider can help determine your BMI and help you achieve or maintain a healthy weight.  For females 24 years of age and older: ? A BMI below 18.5 is considered underweight. ? A BMI of 18.5 to 24.9 is normal. ? A BMI of 25 to 29.9 is considered overweight. ? A BMI of 30 and above is considered obese.  Watch levels of cholesterol and blood lipids  You should start having your blood tested for lipids and cholesterol at 59 years of age, then have this test every 5 years.  You may need to have your cholesterol levels checked more often if: ? Your lipid or cholesterol levels are high. ? You are  older than 59 years of age. ? You are at high risk for heart disease.  Cancer screening Lung Cancer  Lung cancer screening is recommended for adults 85-67 years old who are at high risk for lung cancer because of a history of smoking.  A yearly low-dose CT scan of the lungs is recommended for people who: ? Currently smoke. ? Have quit within the past 15 years. ? Have at least a 30-pack-year history of smoking. A pack year is smoking an average of one pack of cigarettes a day for 1 year.  Yearly screening should continue until it has been 15 years since you quit.  Yearly screening should stop if you develop a health problem that would prevent you from having lung cancer treatment.  Breast Cancer  Practice breast self-awareness. This means understanding how your breasts normally appear and feel.  It also means doing regular breast self-exams. Let your health care provider know about any changes, no matter how small.  If you are in your 20s or 30s, you should have a clinical breast exam (CBE) by a health care provider every 1-3 years as part of a regular health exam.  If you are 49 or older, have a CBE every year. Also consider having a breast X-ray (mammogram) every year.  If you have a family history of breast cancer, talk to your health care provider about genetic screening.  If you are at high risk for breast cancer, talk to your health care provider about having an MRI and a mammogram every year.  Breast cancer gene (BRCA) assessment is recommended for women who have family members with BRCA-related cancers. BRCA-related cancers include: ? Breast. ? Ovarian. ? Tubal. ? Peritoneal cancers.  Results of the assessment will determine  the need for genetic counseling and BRCA1 and BRCA2 testing.  Cervical Cancer Your health care provider may recommend that you be screened regularly for cancer of the pelvic organs (ovaries, uterus, and vagina). This screening involves a pelvic  examination, including checking for microscopic changes to the surface of your cervix (Pap test). You may be encouraged to have this screening done every 3 years, beginning at age 11.  For women ages 33-65, health care providers may recommend pelvic exams and Pap testing every 3 years, or they may recommend the Pap and pelvic exam, combined with testing for human papilloma virus (HPV), every 5 years. Some types of HPV increase your risk of cervical cancer. Testing for HPV may also be done on women of any age with unclear Pap test results.  Other health care providers may not recommend any screening for nonpregnant women who are considered low risk for pelvic cancer and who do not have symptoms. Ask your health care provider if a screening pelvic exam is right for you.  If you have had past treatment for cervical cancer or a condition that could lead to cancer, you need Pap tests and screening for cancer for at least 20 years after your treatment. If Pap tests have been discontinued, your risk factors (such as having a new sexual partner) need to be reassessed to determine if screening should resume. Some women have medical problems that increase the chance of getting cervical cancer. In these cases, your health care provider may recommend more frequent screening and Pap tests.  Colorectal Cancer  This type of cancer can be detected and often prevented.  Routine colorectal cancer screening usually begins at 59 years of age and continues through 59 years of age.  Your health care provider may recommend screening at an earlier age if you have risk factors for colon cancer.  Your health care provider may also recommend using home test kits to check for hidden blood in the stool.  A small camera at the end of a tube can be used to examine your colon directly (sigmoidoscopy or colonoscopy). This is done to check for the earliest forms of colorectal cancer.  Routine screening usually begins at age  73.  Direct examination of the colon should be repeated every 5-10 years through 59 years of age. However, you may need to be screened more often if early forms of precancerous polyps or small growths are found.  Skin Cancer  Check your skin from head to toe regularly.  Tell your health care provider about any new moles or changes in moles, especially if there is a change in a mole's shape or color.  Also tell your health care provider if you have a mole that is larger than the size of a pencil eraser.  Always use sunscreen. Apply sunscreen liberally and repeatedly throughout the day.  Protect yourself by wearing long sleeves, pants, a wide-brimmed hat, and sunglasses whenever you are outside.  Heart disease, diabetes, and high blood pressure  High blood pressure causes heart disease and increases the risk of stroke. High blood pressure is more likely to develop in: ? People who have blood pressure in the high end of the normal range (130-139/85-89 mm Hg). ? People who are overweight or obese. ? People who are African American.  If you are 13-45 years of age, have your blood pressure checked every 3-5 years. If you are 25 years of age or older, have your blood pressure checked every year. You should have  your blood pressure measured twice-once when you are at a hospital or clinic, and once when you are not at a hospital or clinic. Record the average of the two measurements. To check your blood pressure when you are not at a hospital or clinic, you can use: ? An automated blood pressure machine at a pharmacy. ? A home blood pressure monitor.  If you are between 21 years and 1 years old, ask your health care provider if you should take aspirin to prevent strokes.  Have regular diabetes screenings. This involves taking a blood sample to check your fasting blood sugar level. ? If you are at a normal weight and have a low risk for diabetes, have this test once every three years after 59  years of age. ? If you are overweight and have a high risk for diabetes, consider being tested at a younger age or more often. Preventing infection Hepatitis B  If you have a higher risk for hepatitis B, you should be screened for this virus. You are considered at high risk for hepatitis B if: ? You were born in a country where hepatitis B is common. Ask your health care provider which countries are considered high risk. ? Your parents were born in a high-risk country, and you have not been immunized against hepatitis B (hepatitis B vaccine). ? You have HIV or AIDS. ? You use needles to inject street drugs. ? You live with someone who has hepatitis B. ? You have had sex with someone who has hepatitis B. ? You get hemodialysis treatment. ? You take certain medicines for conditions, including cancer, organ transplantation, and autoimmune conditions.  Hepatitis C  Blood testing is recommended for: ? Everyone born from [birth year] through 1965. ? Anyone with known risk factors for hepatitis C.  Sexually transmitted infections (STIs)  You should be screened for sexually transmitted infections (STIs) including gonorrhea and chlamydia if: ? You are sexually active and are younger than 59 years of age. ? You are older than 59 years of age and your health care provider tells you that you are at risk for this type of infection. ? Your sexual activity has changed since you were last screened and you are at an increased risk for chlamydia or gonorrhea. Ask your health care provider if you are at risk.  If you do not have HIV, but are at risk, it may be recommended that you take a prescription medicine daily to prevent HIV infection. This is called pre-exposure prophylaxis (PrEP). You are considered at risk if: ? You are sexually active and do not regularly use condoms or know the HIV status of your partner(s). ? You take drugs by injection. ? You are sexually active with a partner who has HIV.  Talk  with your health care provider about whether you are at high risk of being infected with HIV. If you choose to begin PrEP, you should first be tested for HIV. You should then be tested every 3 months for as long as you are taking PrEP. Pregnancy  If you are premenopausal and you may become pregnant, ask your health care provider about preconception counseling.  If you may become pregnant, take 400 to 800 micrograms (mcg) of folic acid every day.  If you want to prevent pregnancy, talk to your health care provider about birth control (contraception). Osteoporosis and menopause  Osteoporosis is a disease in which the bones lose minerals and strength with aging. This can result in serious bone fractures.  Your risk for osteoporosis can be identified using a bone density scan.  If you are 41 years of age or older, or if you are at risk for osteoporosis and fractures, ask your health care provider if you should be screened.  Ask your health care provider whether you should take a calcium or vitamin D supplement to lower your risk for osteoporosis.  Menopause may have certain physical symptoms and risks.  Hormone replacement therapy may reduce some of these symptoms and risks. Talk to your health care provider about whether hormone replacement therapy is right for you. Follow these instructions at home:  Schedule regular health, dental, and eye exams.  Stay current with your immunizations.  Do not use any tobacco products including cigarettes, chewing tobacco, or electronic cigarettes.  If you are pregnant, do not drink alcohol.  If you are breastfeeding, limit how much and how often you drink alcohol.  Limit alcohol intake to no more than 1 drink per day for nonpregnant women. One drink equals 12 ounces of beer, 5 ounces of wine, or 1 ounces of hard liquor.  Do not use street drugs.  Do not share needles.  Ask your health care provider for help if you need support or information  about quitting drugs.  Tell your health care provider if you often feel depressed.  Tell your health care provider if you have ever been abused or do not feel safe at home. This information is not intended to replace advice given to you by your health care provider. Make sure you discuss any questions you have with your health care provider. Document Released: 07/01/2011 Document Revised: 05/23/2016 Document Reviewed: 09/19/2015 Elsevier Interactive Patient Education  [employer].

## 2018-10-12 NOTE — Progress Notes (Signed)
I have reviewed medications, allergies, PMH, PSH, FH, and SH, and health maintenance.  I agree with documentation for AWV by Danley Danker, RN.

## 2018-10-16 ENCOUNTER — Other Ambulatory Visit (HOSPITAL_COMMUNITY)
Admission: RE | Admit: 2018-10-16 | Discharge: 2018-10-16 | Disposition: A | Discharge status: Home / Self Care | Payer: Medicare HMO | Source: Ambulatory Visit | Attending: Obstetrics & Gynecology | Admitting: Obstetrics & Gynecology

## 2018-10-16 ENCOUNTER — Ambulatory Visit (INDEPENDENT_AMBULATORY_CARE_PROVIDER_SITE_OTHER): Payer: Medicare HMO | Admitting: Obstetrics & Gynecology

## 2018-10-16 ENCOUNTER — Encounter: Payer: Self-pay | Admitting: Obstetrics & Gynecology

## 2018-10-16 VITALS — BP 151/80 | HR 102 | Ht 64.0 in | Wt 190.3 lb

## 2018-10-16 DIAGNOSIS — Z01419 Encounter for gynecological examination (general) (routine) without abnormal findings: Secondary | ICD-10-CM

## 2018-10-16 DIAGNOSIS — Z1272 Encounter for screening for malignant neoplasm of vagina: Secondary | ICD-10-CM | POA: Insufficient documentation

## 2018-10-16 NOTE — Addendum Note (Signed)
Addended by: Dolores Hoose on: 10/16/2018 12:13 PM   Modules accepted: Miquel Dunn

## 2018-10-16 NOTE — Progress Notes (Signed)
Subjective:    Mariah Jackson is a 57 y.o. divorced, widowed P2 female who presents for an annual exam. The patient has no complaints today. The patient is not currently sexually active. GYN screening history: last pap: was normal. The patient wears seatbelts: yes. The patient participates in regular exercise: yes. Walk; Has the patient ever been transfused or tattooed?: no. The patient reports that there is not domestic violence in her life.   Menstrual History: OB History    Gravida  2   Para      Term      Preterm      AB      Living  2     SAB      TAB      Ectopic      Multiple      Live Births  2           Menarche age: 30 or 81 No LMP recorded. Patient is postmenopausal.    The following portions of the patient's history were reviewed and updated as appropriate: past family history, past medical history, past social history and past surgical history.  [place]- Breast cancer 2007 in remission, no one in family with breast or gyn cancers SH- Lumpectomy  Review of Systems Pertinent items are noted in HPI. Pertinent items noted in HPI and remainder of comprehensive ROS otherwise negative.    Objective:    General appearance: alert and cooperative Head: Normocephalic, without obvious abnormality, atraumatic Lungs: clear to auscultation bilaterally Breasts: normal appearance, no masses or tenderness on her right, left breast s/p lumpectomy, otherwise normal exam Heart: regular rate and rhythm, S1, S2 normal, no murmur, click, rub or gallop Abdomen: soft, non-tender; bowel sounds normal; no masses,  no organomegaly Pelvic: cervix normal in appearance, external genitalia normal, no adnexal masses or tenderness, no cervical motion tenderness, rectovaginal septum normal, uterus normal size, shape, and consistency and vagina normal without discharge.    Assessment:    Healthy female exam.    Plan:     Thin prep Pap smear. with cotesting  -Colonoscopy scheduled for  later this year. -Follow up mammogram scheduled. - [facility] testing

## 2018-10-19 ENCOUNTER — Other Ambulatory Visit: Payer: Medicare HMO

## 2018-10-20 LAB — CYTOLOGY - PAP
DIAGNOSIS: NEGATIVE
HPV: NOT DETECTED

## 2018-10-29 ENCOUNTER — Ambulatory Visit (INDEPENDENT_AMBULATORY_CARE_PROVIDER_SITE_OTHER): Payer: Medicare HMO | Admitting: Family Medicine

## 2018-10-29 ENCOUNTER — Encounter: Payer: Self-pay | Admitting: Family Medicine

## 2018-10-29 ENCOUNTER — Other Ambulatory Visit: Payer: Self-pay

## 2018-10-29 VITALS — BP 136/86 | HR 90 | Temp 98.1°F | Ht 64.0 in | Wt 191.0 lb

## 2018-10-29 DIAGNOSIS — D519 Vitamin B12 deficiency anemia, unspecified: Secondary | ICD-10-CM

## 2018-10-29 DIAGNOSIS — Z79899 Other long term (current) drug therapy: Secondary | ICD-10-CM

## 2018-10-29 DIAGNOSIS — G35 Multiple sclerosis: Secondary | ICD-10-CM

## 2018-10-29 DIAGNOSIS — M25569 Pain in unspecified knee: Secondary | ICD-10-CM | POA: Diagnosis not present

## 2018-10-29 DIAGNOSIS — I10 Essential (primary) hypertension: Secondary | ICD-10-CM

## 2018-10-29 DIAGNOSIS — Z13820 Encounter for screening for osteoporosis: Secondary | ICD-10-CM

## 2018-10-29 DIAGNOSIS — E7800 Pure hypercholesterolemia, unspecified: Secondary | ICD-10-CM | POA: Diagnosis not present

## 2018-10-29 DIAGNOSIS — D126 Benign neoplasm of colon, unspecified: Secondary | ICD-10-CM | POA: Diagnosis not present

## 2018-10-29 MED ORDER — TRIAMTERENE-HCTZ 50-25 MG PO CAPS
1.0000 | ORAL_CAPSULE | ORAL | 3 refills | Status: DC
Start: 2018-10-29 — End: 2018-11-13

## 2018-10-29 NOTE — Patient Instructions (Addendum)
Your blood pressure is a little high.  Dr Pihu Basil recommends stating a new blood pressure medication called Dyazide.  It is a water pill that lowers blood pressure.  Take it in the morning with your Amlodipine.   Dr Talisa Petrak would like to see you back in 3 months to see how your blood pressure is doing.   We will arrange a colonoscopy with [facility] to screen for colon cancer in the new year.   Stop at the front desk and ask for information about the "[employer]" for your son.

## 2018-10-30 ENCOUNTER — Encounter: Payer: Self-pay | Admitting: Family Medicine

## 2018-10-30 LAB — BASIC METABOLIC PANEL
BUN/Creatinine Ratio: 15 (ref 9–23)
BUN: 13 mg/dL (ref 6–24)
CO2: 22 mmol/L (ref 20–29)
Calcium: 10.4 mg/dL — ABNORMAL HIGH (ref 8.7–10.2)
Chloride: 105 mmol/L (ref 96–106)
Creatinine, Ser: 0.88 mg/dL (ref 0.57–1.00)
GFR, EST AFRICAN AMERICAN: 83 mL/min/{1.73_m2} (ref >59)
GFR, EST NON AFRICAN AMERICAN: 72 mL/min/{1.73_m2} (ref >59)
Glucose: 113 mg/dL — ABNORMAL HIGH (ref 65–99)
POTASSIUM: 4 mmol/L (ref 3.5–5.2)
SODIUM: 142 mmol/L (ref 134–144)

## 2018-10-30 LAB — LDL CHOLESTEROL, DIRECT: LDL Direct: 68 mg/dL (ref 0–99)

## 2018-10-30 NOTE — Assessment & Plan Note (Signed)
Time for surveillance colonoscopy.   Referred to Dr Havery Moros (GI) for evaluation and treatment.

## 2018-10-30 NOTE — Assessment & Plan Note (Signed)
Established problem celecoxib 200 mg BID prn. Adequate with omeprazole for GI protection. Tolerating.  Aspercreme topical prn also.

## 2018-10-30 NOTE — Assessment & Plan Note (Signed)
Established problem Uncontrolled Rx Dyazide 25/37.5 cap, one cap daily.  Continue Amlodipine 10 mg daily Lab Results  Component Value Date   CREATININE 0.88 10/29/2018   BUN 13 10/29/2018   NA 142 10/29/2018   K 4.0 10/29/2018   CL 105 10/29/2018   CO2 22 10/29/2018   RTC a month to reassess BP on new med combination

## 2018-10-30 NOTE — Assessment & Plan Note (Signed)
Persistent mildly elevated serum calcium.   Asymptomatic. 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  Will monitor.  Check PTH next visit.

## 2018-10-30 NOTE — Progress Notes (Signed)
Subjective:    Patient ID: Mariah Jackson, female    DOB: May 16, 1961, 57 y.o.   MRN: [id number] Mariah Jackson is alone Sources of clinical information for visit is/are patient. Nursing assessment for this office visit was reviewed with the patient for accuracy and revision.  Previous Report(s) Reviewed: historical medical records, notes from Dr Rhea Belton office  Depression screen [facility] 2/9 10/29/2018  Decreased Interest 0  Down, Depressed, Hopeless 0  PHQ - 2 Score 0  Altered sleeping -  Tired, decreased energy -  Change in appetite -  Feeling bad or failure about yourself  -  Trouble concentrating -  Moving slowly or fidgety/restless -  Suicidal thoughts -  PHQ-9 Score -   Fall Risk  10/08/2018 02/05/2018 01/02/2017 12/02/2016 10/18/2016  Falls in the past year? Yes Yes No No No  Number falls in past yr: 1 1 - - -  Injury with Fall? No No - - -  Risk for fall due to : Impaired balance/gait;Impaired mobility - - Impaired mobility Impaired balance/gait;Other (Comment)  Risk for fall due to: Comment - - - Cane uses a cane due to MS.  Follow up Falls prevention discussed - - - -    HPI  Problem List Items Addressed This Visit      Medium   Essential hypertension - Primary CHRONIC HYPERTENSION  Disease Monitoring  Blood pressure range: not checking at home  Chest pain: no   Dyspnea: no   Claudication: no   Medication compliance: yes, taking amlodipine Medication Side Effects  Lightheadedness: no   Urinary frequency: no   Edema: no     Preventitive Healthcare:  Exercise: yes, active in caring for her 3 year old mother, no formal exercise   Diet Pattern: fair  Salt Restriction: no       Low   Pure hypercholesterolemia - taking crestor.  No RUQ pain,  No jaundice     knee osteoarthritis  Knee osteoarthritis  with knee pain involving both knees. Onset was years ago. Inciting event: none known. Current symptoms include: stiffness. Pain is aggravated by rising after  sitting. Evaluation to date: none. Treatment to date: OTC analgesics which are somewhat effective. Adeqaute relief with celebrex prn daily.  APAP not helping. Did not take Rx Duloxetine from February.      Relevant Medications   celecoxib (CELEBREX) 200 MG capsule    Multiple Sclerosis - Longstanding.  Mangement by [facility] - No new symptoms.  - Taking Tecfidera and Ampyra and tolerating. No RUQ pain.  No jaundice, no diarrhea. No dizziness, no numbness or tingling in hands/feet. No constipation.   Smoking status reviewed   Review of Systems See HPI    Objective:   Physical Exam  VS reviewed GEN: Alert, Cooperative, Groomed, NAD HEENT: PERRL; EAC bilaterally not occluded, TM's translucent with normal LM, (+) LR;                No cervical LAN, mild thyromegaly, symmetri without mass COR: RRR, No M/G/R, No JVD, Normal PMI size and location LUNGS: BCTA, No Acc mm use, speaking in full sentences Left ankle in lace up ankle brace.  Gait: Normal speed, No significant path deviation, Step through +,  Psych: Normal affect/thought/speech/language    Assessment & Plan:  Visit Problem List with A/P  Tubular adenoma of colon Time for surveillance colonoscopy.   Referred to Dr Havery Moros (GI) for evaluation and treatment.   Essential hypertension Established problem Uncontrolled Rx Dyazide 25/37.5 cap, one  cap daily.  Continue Amlodipine 10 mg daily Lab Results  Component Value Date   CREATININE 0.88 10/29/2018   BUN 13 10/29/2018   NA 142 10/29/2018   K 4.0 10/29/2018   CL 105 10/29/2018   CO2 22 10/29/2018   RTC a month to reassess BP on new med combination   Pure hypercholesterolemia LDL-C 68 Established problem Controlled Continue current therapy regiment Crestor.

## 2018-10-30 NOTE — Assessment & Plan Note (Signed)
LDL-C 68 Established problem Controlled Continue current therapy regiment Crestor.

## 2018-11-11 ENCOUNTER — Telehealth: Payer: Self-pay

## 2018-11-11 DIAGNOSIS — I10 Essential (primary) hypertension: Secondary | ICD-10-CM

## 2018-11-11 NOTE — Telephone Encounter (Signed)
Patient left message that she received letter from [facility] that Dyazide is on national backorder. Can an alternative be prescribed?  Call back is [phone]  Danley Danker, RN [facility])

## 2018-11-13 MED ORDER — SPIRONOLACTONE-HCTZ 25-25 MG PO TABS: 1.0000 | ORAL_TABLET | Freq: Every day | ORAL | 1 refills | Status: DC

## 2018-11-13 NOTE — Telephone Encounter (Signed)
Please let patient know that a replacement for dyazide was sent to her [facility] pharman[facility], It is a a combination of spironolactone and HCTZ call Aldactazide. Do not take this new medication with your old Dyazide.  Stop your Dyazide once you start the new medication.

## 2018-11-16 NOTE — Telephone Encounter (Signed)
Spoke with patient. She said that she would pick up new medication.  Salvatore Marvel, CMA

## 2018-11-18 ENCOUNTER — Other Ambulatory Visit: Payer: Self-pay

## 2018-11-18 DIAGNOSIS — I10 Essential (primary) hypertension: Secondary | ICD-10-CM

## 2018-11-20 MED ORDER — SPIRONOLACTONE-HCTZ 25-25 MG PO TABS: 1.0000 | ORAL_TABLET | Freq: Every day | ORAL | 1 refills | Status: DC

## 2018-12-31 ENCOUNTER — Other Ambulatory Visit: Payer: Self-pay | Admitting: Family Medicine

## 2018-12-31 DIAGNOSIS — Z1231 Encounter for screening mammogram for malignant neoplasm of breast: Secondary | ICD-10-CM

## 2019-01-26 ENCOUNTER — Other Ambulatory Visit: Payer: Self-pay | Admitting: Neurology

## 2019-01-26 DIAGNOSIS — G35 Multiple sclerosis: Secondary | ICD-10-CM

## 2019-02-01 ENCOUNTER — Ambulatory Visit
Admission: RE | Admit: 2019-02-01 | Discharge: 2019-02-01 | Disposition: A | Discharge status: Home / Self Care | Payer: Medicare HMO | Source: Ambulatory Visit | Attending: Family Medicine | Admitting: Family Medicine

## 2019-02-01 DIAGNOSIS — Z1231 Encounter for screening mammogram for malignant neoplasm of breast: Secondary | ICD-10-CM

## 2019-02-02 ENCOUNTER — Other Ambulatory Visit: Payer: Self-pay | Admitting: Family Medicine

## 2019-02-02 DIAGNOSIS — R928 Other abnormal and inconclusive findings on diagnostic imaging of breast: Secondary | ICD-10-CM

## 2019-02-08 ENCOUNTER — Ambulatory Visit
Admission: RE | Admit: 2019-02-08 | Discharge: 2019-02-08 | Disposition: A | Discharge status: Home / Self Care | Payer: Medicare HMO | Source: Ambulatory Visit | Attending: Family Medicine | Admitting: Family Medicine

## 2019-02-08 ENCOUNTER — Other Ambulatory Visit: Payer: Self-pay | Admitting: Family Medicine

## 2019-02-08 DIAGNOSIS — R928 Other abnormal and inconclusive findings on diagnostic imaging of breast: Secondary | ICD-10-CM

## 2019-02-08 DIAGNOSIS — N6489 Other specified disorders of breast: Secondary | ICD-10-CM | POA: Diagnosis not present

## 2019-02-08 DIAGNOSIS — R922 Inconclusive mammogram: Secondary | ICD-10-CM | POA: Diagnosis not present

## 2019-02-08 DIAGNOSIS — N6310 Unspecified lump in the right breast, unspecified quadrant: Secondary | ICD-10-CM

## 2019-03-04 ENCOUNTER — Encounter: Payer: Self-pay | Admitting: Family Medicine

## 2019-03-04 ENCOUNTER — Ambulatory Visit (INDEPENDENT_AMBULATORY_CARE_PROVIDER_SITE_OTHER): Payer: Medicare HMO | Admitting: Family Medicine

## 2019-03-04 ENCOUNTER — Other Ambulatory Visit: Payer: Self-pay

## 2019-03-04 DIAGNOSIS — D126 Benign neoplasm of colon, unspecified: Secondary | ICD-10-CM | POA: Diagnosis not present

## 2019-03-04 DIAGNOSIS — M25561 Pain in right knee: Secondary | ICD-10-CM

## 2019-03-04 DIAGNOSIS — I10 Essential (primary) hypertension: Secondary | ICD-10-CM | POA: Diagnosis not present

## 2019-03-04 DIAGNOSIS — G8929 Other chronic pain: Secondary | ICD-10-CM

## 2019-03-04 DIAGNOSIS — K219 Gastro-esophageal reflux disease without esophagitis: Secondary | ICD-10-CM

## 2019-03-04 DIAGNOSIS — R928 Other abnormal and inconclusive findings on diagnostic imaging of breast: Secondary | ICD-10-CM

## 2019-03-04 DIAGNOSIS — E038 Other specified hypothyroidism: Secondary | ICD-10-CM | POA: Diagnosis not present

## 2019-03-04 DIAGNOSIS — Z86000 Personal history of in-situ neoplasm of breast: Secondary | ICD-10-CM

## 2019-03-04 NOTE — Patient Instructions (Addendum)
Your blood pressure looks good.  Keep taking your two blood pressure medicines.  They are working!  If you have not heard from Sports Medicine within a week, please let Dr Aadvik Roker's office know.    We are checking your parathyroid hormone today because your blood level of calcium is just a little high.  Dr Damary Doland will call you with the results.    Please get your breast biopsy.

## 2019-03-05 ENCOUNTER — Encounter: Payer: Self-pay | Admitting: Family Medicine

## 2019-03-05 DIAGNOSIS — Z86000 Personal history of in-situ neoplasm of breast: Secondary | ICD-10-CM

## 2019-03-05 DIAGNOSIS — N6091 Unspecified benign mammary dysplasia of right breast: Secondary | ICD-10-CM | POA: Insufficient documentation

## 2019-03-05 HISTORY — DX: Personal history of in-situ neoplasm of breast: Z86.000

## 2019-03-05 HISTORY — DX: Unspecified benign mammary dysplasia of right breast: N60.91

## 2019-03-05 LAB — CA+CREAT+P+PTH INTACT
CALCIUM: 11 mg/dL — AB (ref 8.7–10.3)
Creatinine, Ser: 0.94 mg/dL (ref 0.57–1.00)
GFR calc non Af Amer: 66 mL/min/{1.73_m2} (ref >59)
GFR, EST AFRICAN AMERICAN: 76 mL/min/{1.73_m2} (ref >59)
PTH: 63 pg/mL (ref 15–65)
Phosphorus: 3.4 mg/dL (ref 3.0–4.3)

## 2019-03-05 LAB — TSH: TSH: 3.46 u[IU]/mL (ref 0.450–4.500)

## 2019-03-05 NOTE — Assessment & Plan Note (Signed)
Lab Results  Component Value Date   TSH 3.460 03/04/2019  Stable.  No change in LT4 100 mcg daily dose

## 2019-03-05 NOTE — Assessment & Plan Note (Addendum)
  Normal SCr, normal total protein, normal Albumin/Immuniglobin ratio  Other possible contributors to increase serum calcium include Dyazide, abnormal 8 mm breast asymmetry,   High normal range PTH with mildly elevated serum calcium c/w probable asymptomatic Primary Hyperparathyroidism

## 2019-03-05 NOTE — Assessment & Plan Note (Signed)
Established problem worsened.  Uncertain if there is a popliteal cyst or not. Referral to  Sports Medicine for evaluation and treatment. They helped her with her MS-related right foot drop  and she has confidence in them.

## 2019-03-05 NOTE — Assessment & Plan Note (Signed)
Patient had canceled previous appointment with Dr Havery Moros (GI) 10/2018 bc of "many other medical things going on." Previous patient of Dr Deatra Ina (GI) who found a tubular adenoma  In 2014 colonoscopy.  He recommended follow up in 5 years.   Patient said she would be willing to go for monitoring colonoscopy.   Will re-request appointment for follow-up colonoscopy with Dr Havery Moros (GI).

## 2019-03-05 NOTE — Progress Notes (Signed)
Subjective:    Patient ID: Mariah Jackson, female    DOB: 08-24-1961, 58 y.o.   MRN: [id number] Mariah Jackson is alone Sources of clinical information for visit is/are patient. Nursing assessment for this office visit was reviewed with the patient for accuracy and revision.  Previous Report(s) Reviewed: historical medical records, notes from Dr Rhea Belton office  Depression screen [facility] 2/9 03/04/2019  Decreased Interest 0  Down, Depressed, Hopeless 0  PHQ - 2 Score 0  Altered sleeping -  Tired, decreased energy -  Change in appetite -  Feeling bad or failure about yourself  -  Trouble concentrating -  Moving slowly or fidgety/restless -  Suicidal thoughts -  PHQ-9 Score -   Fall Risk  03/04/2019 10/08/2018 02/05/2018 01/02/2017 12/02/2016  Falls in the past year? 0 Yes Yes No No  Number falls in past yr: - 1 1 - -  Injury with Fall? - No No - -  Risk for fall due to : - Impaired balance/gait;Impaired mobility - - Impaired mobility  Risk for fall due to: Comment - - - - Cane  Follow up - Falls prevention discussed - - -    Hypertension     Problem List Items Addressed This Visit      Medium   Essential hypertension - Primary CHRONIC HYPERTENSION  Disease Monitoring  Blood pressure range: not checking at home  Chest pain: no   Dyspnea: no   Claudication: no   Medication compliance: yes, taking amlodipine Medication Side Effects  Lightheadedness: no   Urinary frequency: no   Edema: no     Preventitive Healthcare:  Exercise: yes, active in caring for her 53 year old mother, no formal exercise   Diet Pattern: fair  Salt Restriction: no         Right knee pain - Onset years ago - progressive worsening - located primarily in back of knee and lower thigh - Pt has been told there is "bone rubbing against bone"  - Worse when first stand in morning, then improves over next half hour of activity.  Also worse with rising after sitting.   - Taking Celebrex daily, has had times  where took two 200 mg capsules twice a day.  Helps some with knee pain.  Takes omeprazole for GI protection - APAP is not helpful.   - She did not start trial of duloxetine prescribed in 2018 for bilateral chronic knee pain. - Does interfere with walking mild to moderate degree intermittently - intermittent left knee pain with stiffness    Relevant Medications   celecoxib (CELEBREX) 200 MG capsule    Hypothyroidism Longstanding issue for patient Patient presents for evaluation of thyroid function.  Symptoms consist of denies fatigue, weight changes, heat/cold intolerance, bowel/skin changes or CVS symptoms.  Symptoms have present for a few years.  The symptoms are no.   The problem has been unchanged.   Previous thyroid studies include TSH which have been in normal range   Smoking status reviewed   Review of Systems See HPI    Objective:   Physical Exam  VS reviewed GEN: Alert, Cooperative, Groomed, NAD HEENT: PERRL; EAC bilaterally not occluded, TM's translucent with normal LM, (+) LR;                No cervical LAN, mild thyromegaly, symmetri without mass COR: RRR, No M/G/R, No JVD, Normal PMI size and location LUNGS: BCTA, No Acc mm use, speaking in full sentences Left ankle in  lace up ankle brace.  Gait: Normal speed, No significant path deviation, Step through +,  Psych: Normal affect/thought/speech/language  Right knee Effusion: absent though question of vague fluctuance in popliteal space Erythema: absent Increased Warmth: absent Crepitus present with passive ROM assessment Tenderness: Medial hamstring and popliteal fossa  Maneuvers for ACL tear Anterior Drawer: normal  Maneuver for meniscus tear McMurray: reproducible clicking sound and sensation Tender joint line: no  no collateral ligament laxity   Range of Motion: not limited  Office ABI: Right ABI 1.23   Assessment & Plan:  Visit Problem List with A/P  No problem-specific Assessment & Plan notes  found for this encounter.

## 2019-03-05 NOTE — Assessment & Plan Note (Addendum)
Mammogram 01/2019: medial right breast demonstrate a persistent irregular asymmetry measuring approximately 8 mm.   I encouraged Mariah Jackson to follow thru with her recommended stereotactic breast biopsy.  She said she would reschedule it.

## 2019-03-05 NOTE — Assessment & Plan Note (Signed)
Adequate blood pressure control.  No evidence of new end organ damage.  Tolerating medication without significant adverse effects.  Plan to continue current blood pressure regiment.   

## 2019-03-08 ENCOUNTER — Other Ambulatory Visit: Payer: Self-pay | Admitting: Family Medicine

## 2019-03-08 DIAGNOSIS — N6310 Unspecified lump in the right breast, unspecified quadrant: Secondary | ICD-10-CM

## 2019-03-10 ENCOUNTER — Other Ambulatory Visit: Payer: Self-pay

## 2019-03-10 ENCOUNTER — Ambulatory Visit: Payer: Medicare HMO | Admitting: Sports Medicine

## 2019-03-10 ENCOUNTER — Ambulatory Visit: Payer: Self-pay

## 2019-03-10 VITALS — BP 143/80 | Ht 64.0 in | Wt 189.0 lb

## 2019-03-10 DIAGNOSIS — M25561 Pain in right knee: Secondary | ICD-10-CM

## 2019-03-10 MED ORDER — METHYLPREDNISOLONE ACETATE 40 MG/ML IJ SUSP
40.0000 mg | Freq: Once | INTRAMUSCULAR | Status: AC
  Administered 2019-03-10: 40 mg via INTRA_ARTICULAR

## 2019-03-10 NOTE — Progress Notes (Addendum)
Jakiera Ehler - 58 y.o. female MRN [id number]  Date of birth: 16-Jul-1961   Chief Complaint: Right knee/calf pain  HPI:  59 year old female who presents for right knee and calf pain.  Says the pain for started a few months ago.  It is described as a tightness in the back of her knee and upper calf area.  States that the pain remained constant for a while.  A few weeks ago the pain became sharper and much worse, and has slowly resolved since then.  Patient has been taking Celebrex 400 mg twice daily.  She admits that this is double was prescribed to her.  Pain is mainly occurring while resting, activity makes it better.  She feels it is affecting her mobility.  Baseline she uses a cane for mobility, but she started having use a rolling walker.  She occasionally will like the "wants to give out".  Patient recently seen by PCP on 3/5.  Noted to have fullness posterior calf of right leg.  Refer to sports medicine for possible Baker's cyst.  Patient had x-rays "several years ago".  Was noted to have "bone-on-bone contact in the knee".  Patient has had corticoid injection in the past.  She thinks this is her left knee though.  Did get some relief for this.  ROS:     See HPI  PERTINENT  PMH / PSH FH / / SH:  Past Medical, Surgical, Social, and Family History Reviewed & Updated in the EMR.  Pertinent findings include:  History of multiple sclerosis, hypothyroidism, tubular adenoma of colon, hypertension, DCIS of breast  OBJECTIVE: BP (!) 143/80   Ht 5\' 4"  (1.626 m)   Wt 189 lb (85.7 kg)   BMI 32.44 kg/m   Physical Exam:  Vital signs are reviewed.  GEN: Alert and oriented, NAD Pulm: Breathing unlabored PSY: normal mood, congruent affect  MSK: Right knee Inspection: Mild swelling noted medial and lateral to patellar tendon.  Very minimal varus deformity right leg. Palpation: Notable crepitus with knee extension and flexion.  Mild fullness noted posterior knee.  No tenderness to deep palpation  posterior knee or anteriorly. Range of motion: Range of motion knee fully intact to knee flexion, extension, valgus stress, varus stress. Strength: 5 out of 5 strength knee flexion, extension, hip abduction, hip adduction, hip flexion, hip extension Stability: No evidence of instability right knee Neurovascular: No focal neurologic deficit appreciated.  Warm and dry skin, less than 2-second cap refill Special test: Negative McMurray, negative anterior drawer, negative posterior drawer.  Left Knee Inspection: No swelling or valgus/varus deformity. Palpation: moderate crepitus with knee extension and flexion. No fullness noted posterior knee.  No tenderness to deep palpation posterior knee or anteriorly. Range of motion: Range of motion knee fully intact to knee flexion, extension, valgus stress, varus stress. Strength: 5 out of 5 strength knee flexion, extension, hip abduction, hip adduction, hip flexion, hip extension Stability: No evidence of instability right knee Neurovascular: No focal neurologic deficit appreciated.  Warm and dry skin, less than 2-second cap refill Special test: Negative McMurray, negative anterior drawer, negative posterior drawer.  Ultrasound right knee: fluid collection noted in posterior knee. Multiple osteophytes noted tibial condyle.  ASSESSMENT & PLAN:  1.  Right knee pain Given patient's history and sonographic findings the fullness in her posterior knee is likely related to her cyst.  Given her history of intense pain with resolving symptoms and fluid but no discrete cystic structure seen, her pain likely from a ruptured Baker's  cyst.  Sonographic findings consistent with osteoarthritis as well.  Offered patient glucocorticoid injection right knee which she accepted.  Procedure tolerated well without any immediate side effects.  Will get bilateral x-rays to further evaluate bony pathology.  Also made referral to physical therapy. -Glucocorticoid injection right  knee -Physical therapy referral placed -Bilateral knee x-rays -Follow-up in 4 weeks  Right knee injection Procedure note INJECTION: Patient was given informed consent, signed copy in the chart. Appropriate time out was taken. Area prepped and draped in usual sterile fashion. 2 cc of Depo-medrol 40 mg/ml plus  4 cc of 1% lidocaine without epinephrine was injected into the right using a(n) lateral approach. The patient tolerated the procedure well. There were no complications. Post procedure instructions were given.  I was the preceptor for this visit and available for immediate consultation Shellia Cleverly, DO

## 2019-03-11 ENCOUNTER — Encounter: Payer: Self-pay | Admitting: Sports Medicine

## 2019-03-11 NOTE — Addendum Note (Signed)
Addended by: Jolinda Croak E on: 03/11/2019 10:36 AM   Modules accepted: Orders

## 2019-03-12 ENCOUNTER — Ambulatory Visit
Admission: RE | Admit: 2019-03-12 | Discharge: 2019-03-12 | Disposition: A | Discharge status: Home / Self Care | Payer: Medicare HMO | Source: Ambulatory Visit | Attending: Sports Medicine | Admitting: Sports Medicine

## 2019-03-12 ENCOUNTER — Encounter: Payer: Self-pay | Admitting: Family Medicine

## 2019-03-12 ENCOUNTER — Ambulatory Visit
Admission: RE | Admit: 2019-03-12 | Discharge: 2019-03-12 | Disposition: A | Discharge status: Home / Self Care | Payer: Medicare HMO | Source: Ambulatory Visit | Attending: Family Medicine | Admitting: Family Medicine

## 2019-03-12 ENCOUNTER — Other Ambulatory Visit: Payer: Self-pay

## 2019-03-12 DIAGNOSIS — R928 Other abnormal and inconclusive findings on diagnostic imaging of breast: Secondary | ICD-10-CM | POA: Diagnosis not present

## 2019-03-12 DIAGNOSIS — N6310 Unspecified lump in the right breast, unspecified quadrant: Secondary | ICD-10-CM

## 2019-03-12 DIAGNOSIS — M25561 Pain in right knee: Secondary | ICD-10-CM

## 2019-03-12 DIAGNOSIS — N6091 Unspecified benign mammary dysplasia of right breast: Secondary | ICD-10-CM | POA: Diagnosis not present

## 2019-03-12 DIAGNOSIS — M1711 Unilateral primary osteoarthritis, right knee: Secondary | ICD-10-CM | POA: Diagnosis not present

## 2019-03-12 DIAGNOSIS — M1712 Unilateral primary osteoarthritis, left knee: Secondary | ICD-10-CM | POA: Diagnosis not present

## 2019-03-12 HISTORY — PX: MM BREAST STEREO BX*L*R/S: HXRAD495

## 2019-03-12 HISTORY — PX: BREAST BIOPSY: SHX20

## 2019-03-16 ENCOUNTER — Encounter: Payer: Self-pay | Admitting: Family Medicine

## 2019-03-22 ENCOUNTER — Encounter: Payer: Self-pay | Admitting: Neurology

## 2019-03-22 DIAGNOSIS — G35 Multiple sclerosis: Secondary | ICD-10-CM | POA: Diagnosis not present

## 2019-03-22 MED ORDER — DIMETHYL FUMARATE 240 MG PO CPDR: 1.0000 | DELAYED_RELEASE_CAPSULE | Freq: Two times a day (BID) | ORAL | 4 refills | Status: DC

## 2019-03-22 MED ORDER — DALFAMPRIDINE ER 10 MG PO TB12: 10.0000 mg | ORAL_TABLET | Freq: Two times a day (BID) | ORAL | 4 refills | Status: DC

## 2019-03-22 NOTE — Progress Notes (Signed)
Virtual Visit via Telephone Note  I connected with Mariah Jackson. on 03/22/19 at  by telephone and verified that I am speaking with the correct person using two identifiers.   I discussed the limitations, risks, security and privacy concerns of performing an evaluation and management service by telephone and the availability of in person appointments. I also discussed with the patient that there may be a patient responsible charge related to this service. The patient expressed understanding and agreed to proceed.   History of Present Illness:  She was diagnosed with multiple sclerosis in 2001, by Dr. Dellis Jackson at [facility]. The disease has primarily affected her balance and gait. She could only clarify one exacerbation, presenting as worsening balance difficulty, and says she has never been treated with intravenous steroids.   She was placed on Betaseron shortly after diagnosis, and has been on that drug ever since. Her gait difficulty had little changes over the  years. She ambulates with a cane, but is able to get around the house pretty well without it.   Most recent MRI was in January 2011,shows multiple periventricular, periatrial and subcortical white matter hyperintensities suggestive of demyelinating disease.No enhancing lesions or significant atrophy is seen. compared to MRI films from Aug 2000 there appear to be more T2 lesions in right frontal subcortical region.   She is tired of taking the shots and does have multiple areas of lipoatrophy, especially on the abdominal area. She is ambulating with a cane. She  switched to Tecfidera since Oct 2014, doing well.  UPDATE Sep 18th 2015: She denies signficant side effect from Tecfidera. She has been on Ampyra 10mg  bid for few years, which definitely has helped her walking  MRI brain March 2014: Multiple periventricular and subcortical chronic demyelinating disease. No acute plaques are seen. MRI cervical spine: Disc bulging at C5-6.  Spondylosis at C5-6 and C6-7. C5-6 there is focal disc protrusion slightly deforms anterior spinal cord, with spinal stenosis or foraminal stenosis. No intrinsic or enhancing spinal cord lesions.  UPDATE March 17th 2016: She fell in Feb 2016, now with right shoulder and neck pain, overall has improved, she is continue taking Tecfidera, Ampyra, denies significant side effect, ambulate with spastic gait, wear her right ankle brace.  UPDATE July 16 2017: She is overall doing well, no flareup, continue have gait abnormality, complains of bilateral knee pain, has valgrus, reviewed laboratory evaluations, normal CMP, elevated alkaline phosphate 151, mild elevated glucose 101, CBC showed low MCV 75, normal TSH  Update March 16, 2018:  She is overall doing very well, no significant change, continue taking Ampyra, using of one-point cane, unsteady gait,  I have personally reviewed MRI of brain with and without contrast in August 2018, multiple T2/FLAIR hyperintensity lesions at both hemisphere, no contrast enhancement, there was no infratentorial lesions, no significant change compared to previous scans.   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  I personally reviewed most recent MRI of brain with without contrast in August 2018, multiple T2/flair hyperintensity foci in the white matter of both  hemispheres, with large confluences in the periatrial white matter bilaterally,  Laboratory evaluations in 2020 showed normal TSH,  elevated calcium 11, normal creatinine 0.94, normal PTH 63  Assessment and Plan: Relapsing remitting multiple sclerosis  Overall stable,  Refilled her Tecfidera, Ampyra,  Follow Up Instructions:  Patient is advised to call our office to schedule follow-up appointment in 6 to 12 months   I discussed the assessment and treatment plan with the patient. The patient was provided an opportunity to ask questions and all were answered. The patient agreed with the plan and demonstrated an understanding of the instructions.   The patient was advised to call back or seek an in-person evaluation if the symptoms worsen or if the condition fails to improve as anticipated.    I provided 11 minutes of non-face-to-face time during this encounter.     Marcial Pacas, MD

## 2019-03-23 ENCOUNTER — Telehealth (INDEPENDENT_AMBULATORY_CARE_PROVIDER_SITE_OTHER): Payer: Medicare HMO | Admitting: Neurology

## 2019-04-06 ENCOUNTER — Other Ambulatory Visit: Payer: Self-pay | Admitting: Family Medicine

## 2019-04-06 DIAGNOSIS — M25569 Pain in unspecified knee: Secondary | ICD-10-CM

## 2019-04-12 ENCOUNTER — Other Ambulatory Visit: Payer: Self-pay

## 2019-04-13 MED ORDER — OMEPRAZOLE 20 MG PO CPDR: 20.0000 mg | DELAYED_RELEASE_CAPSULE | Freq: Every day | ORAL | 3 refills | Status: DC

## 2019-05-03 ENCOUNTER — Other Ambulatory Visit: Payer: Self-pay | Admitting: Neurology

## 2019-05-03 DIAGNOSIS — G35 Multiple sclerosis: Secondary | ICD-10-CM

## 2019-05-07 ENCOUNTER — Other Ambulatory Visit: Payer: Self-pay | Admitting: Family Medicine

## 2019-05-15 ENCOUNTER — Other Ambulatory Visit: Payer: Self-pay | Admitting: Family Medicine

## 2019-05-15 DIAGNOSIS — E038 Other specified hypothyroidism: Secondary | ICD-10-CM

## 2019-05-27 ENCOUNTER — Other Ambulatory Visit: Payer: Self-pay | Admitting: *Deleted

## 2019-05-27 MED ORDER — DALFAMPRIDINE ER 10 MG PO TB12: 10.0000 mg | ORAL_TABLET | Freq: Two times a day (BID) | ORAL | 3 refills | Status: DC

## 2019-05-28 DIAGNOSIS — H5213 Myopia, bilateral: Secondary | ICD-10-CM | POA: Diagnosis not present

## 2019-05-28 DIAGNOSIS — Z0100 Encounter for examination of eyes and vision without abnormal findings: Secondary | ICD-10-CM | POA: Diagnosis not present

## 2019-06-28 IMAGING — MG MM CLIP PLACEMENT
4 series · 4 of 12 positions shown · non-contrast
Comparison: Previous exam(s).

CLINICAL DATA: 58-year-old female status post stereotactic biopsy
of the right breast.

EXAM:
DIAGNOSTIC RIGHT MAMMOGRAM POST STEREOTACTIC BIOPSY

[R ML synth-2D]
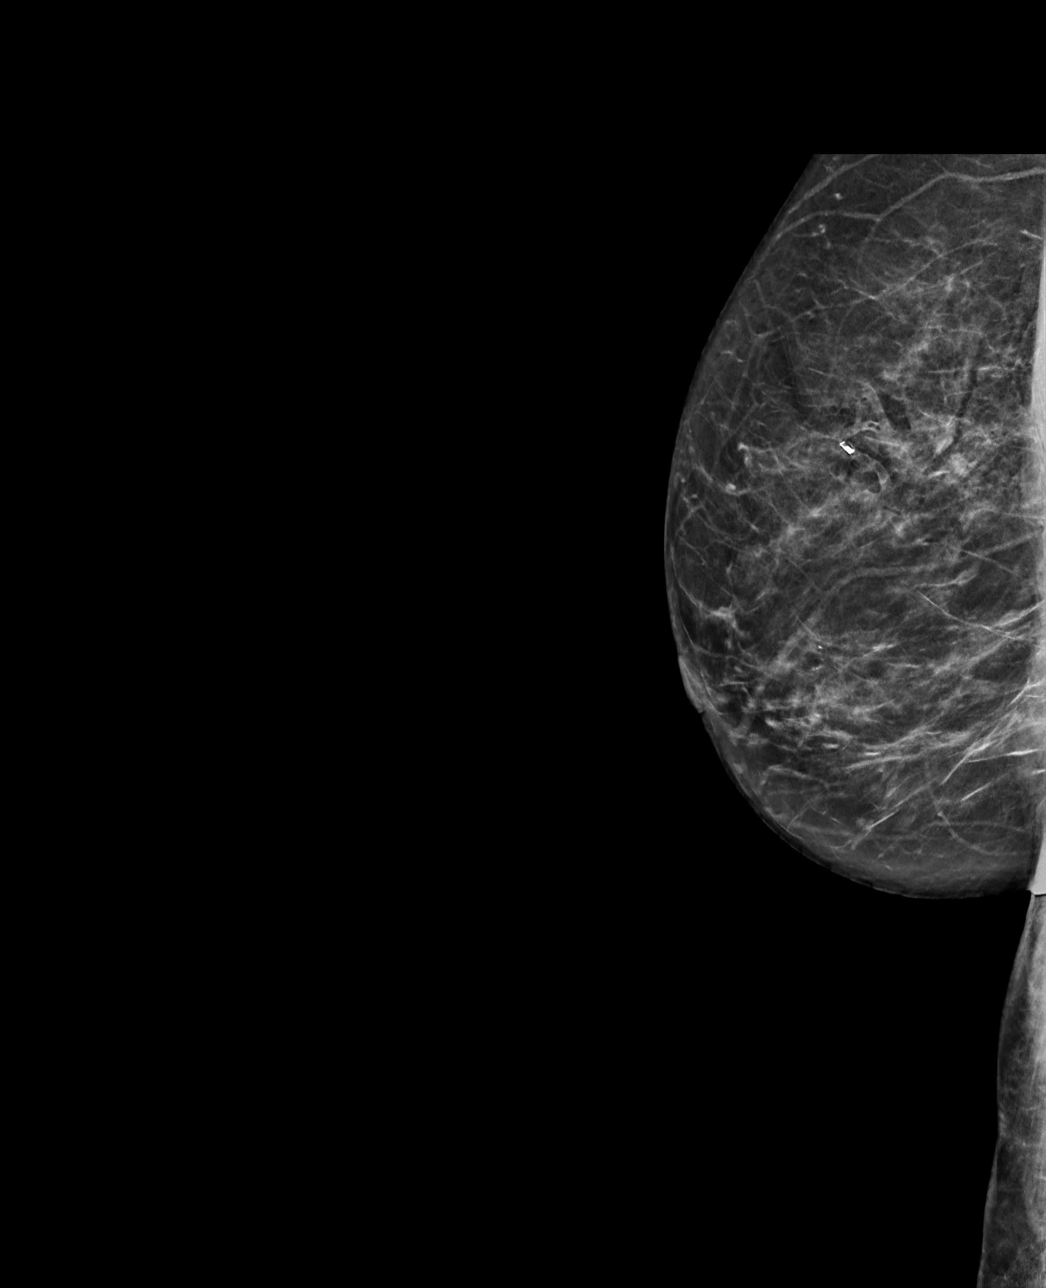

[R CC synth-2D]
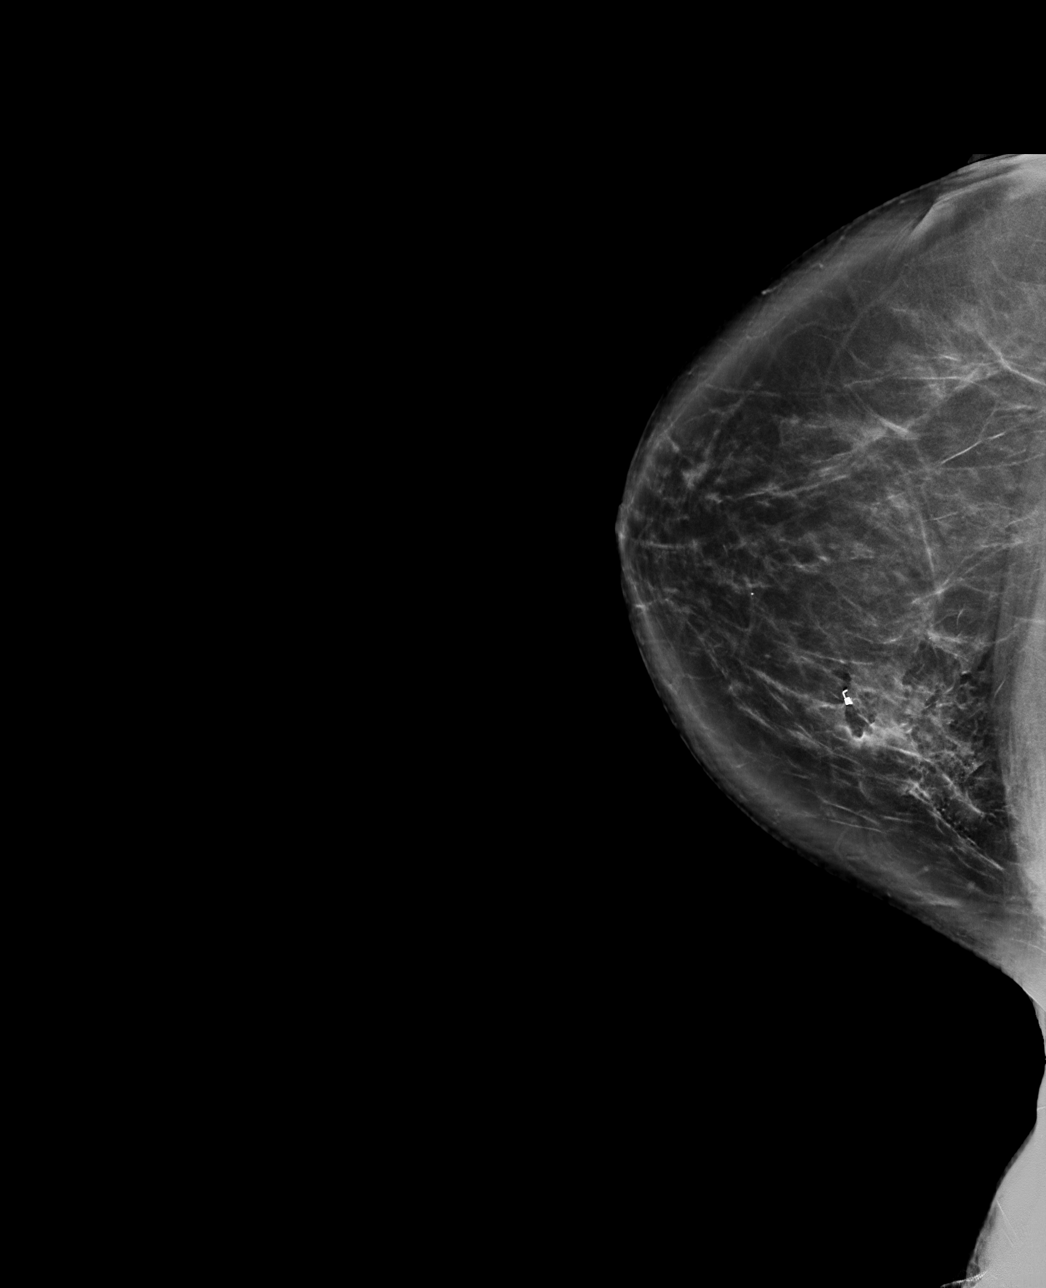

[R CC tomo · tomo slice 45/88.0]
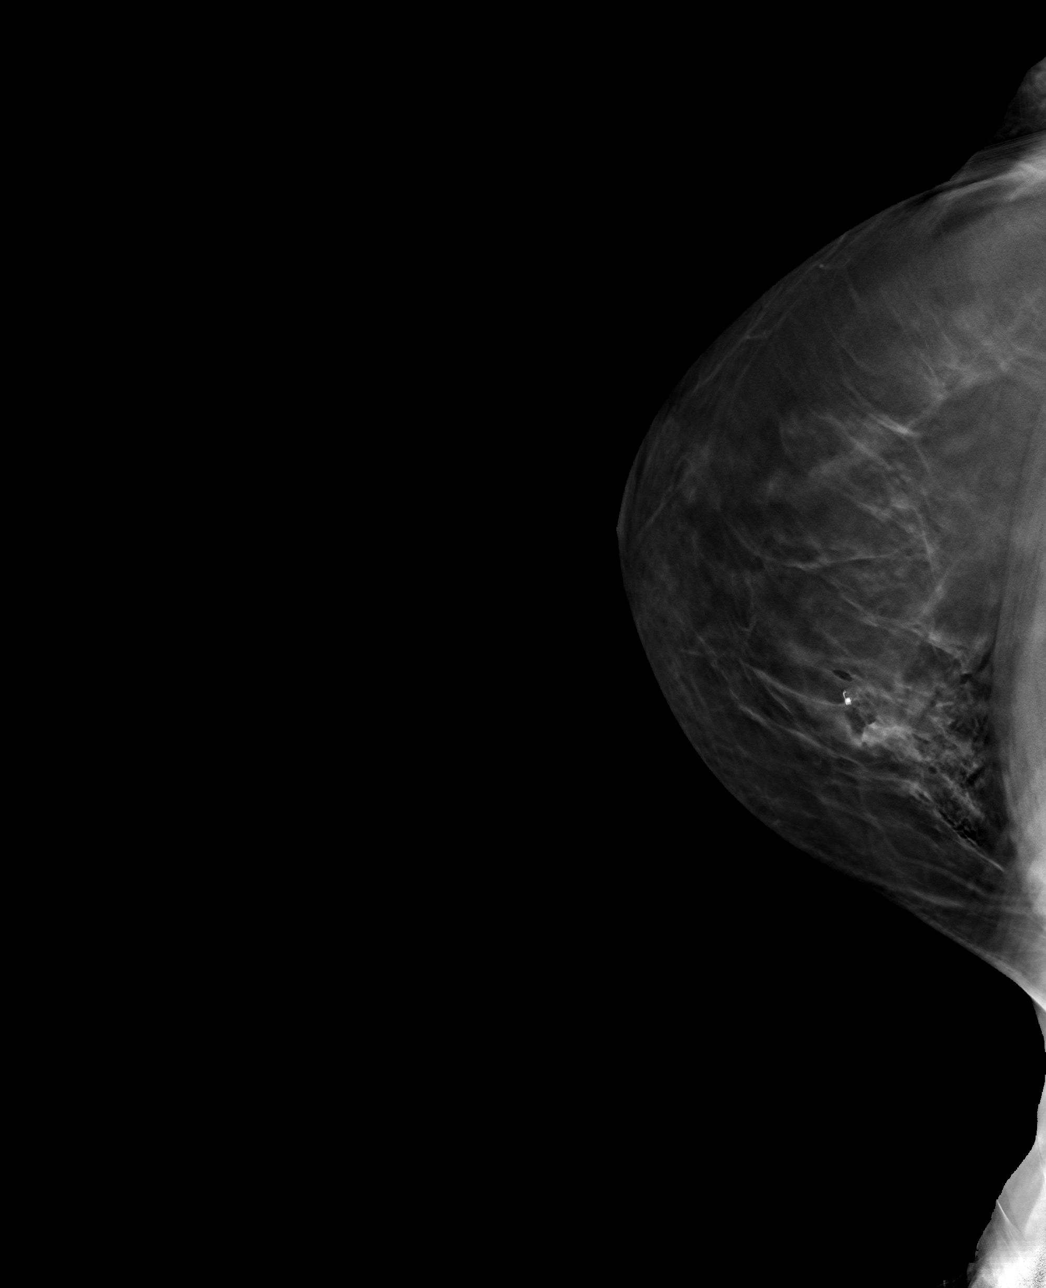

[R ML tomo · tomo slice 32/63.0]
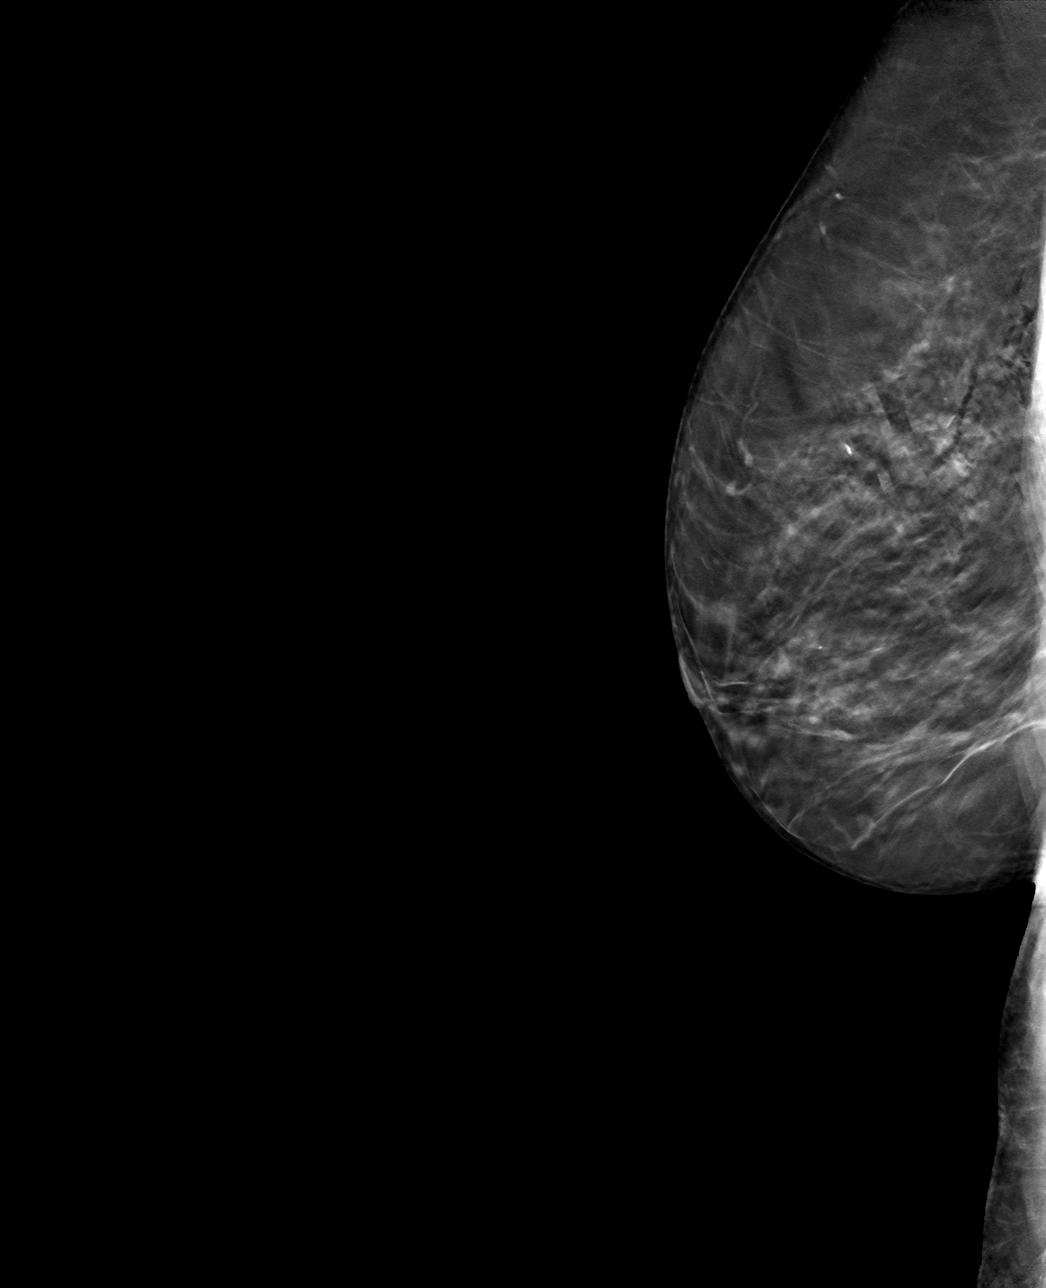

[4 of 12 positions shown; findings below may reference images not displayed]

FINDINGS: Mammographic images were obtained following stereotactic guided
biopsy of the right breast. A coil shaped clip is identified in the
upper inner quadrant. This is in the expected location.
IMPRESSION: Post biopsy clip in the expected location.

Final Assessment: Post Procedure Mammograms for Marker Placement

## 2019-06-28 IMAGING — MG STEREOTACTIC VACUUM ASSIST RIGHT
8 of 11 series · 8 of 19 positions shown · non-contrast
Comparison: Previous exams.
COMPARISON: Previous exams.

Addendum:
CLINICAL DATA: 58-year-old female with an indeterminate right
breast asymmetry without ultrasound correlate. This area localizes
superiorly on tomosynthesis.

EXAM:
RIGHT BREAST STEREOTACTIC CORE NEEDLE BIOPSY

[R (1 of 8)]
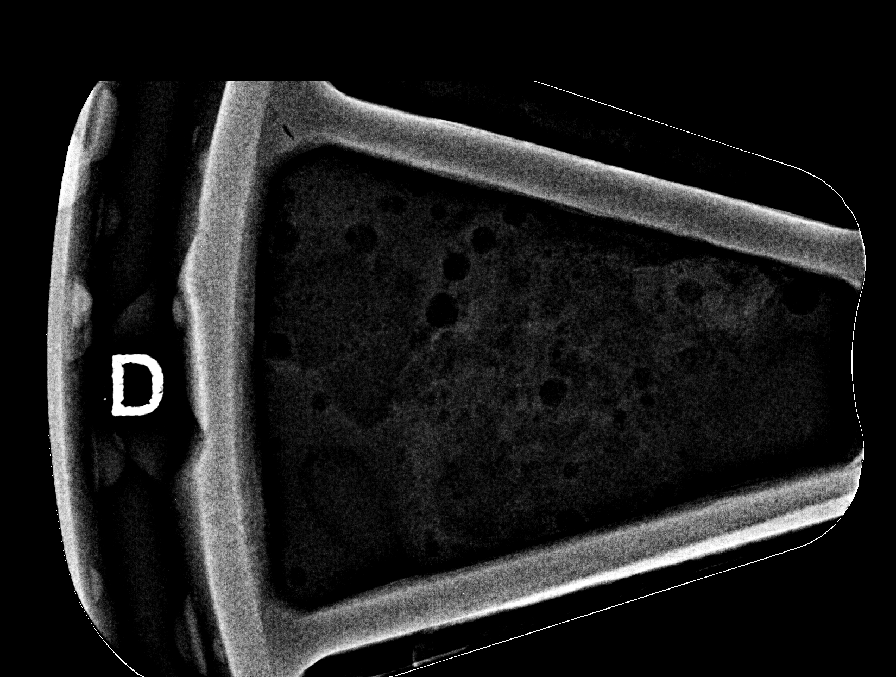

[R (2 of 8)]
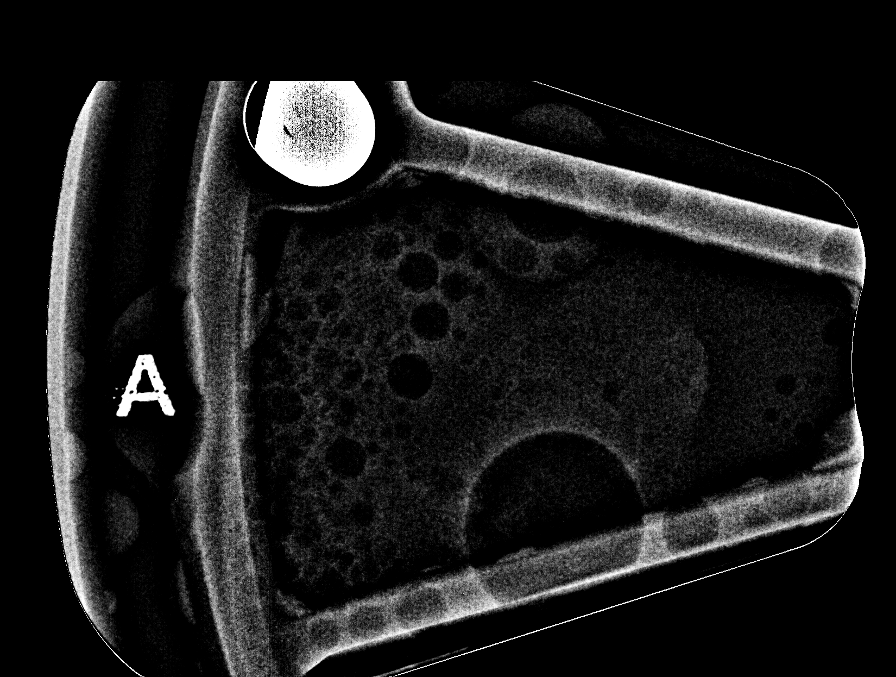

[R (3 of 8)]
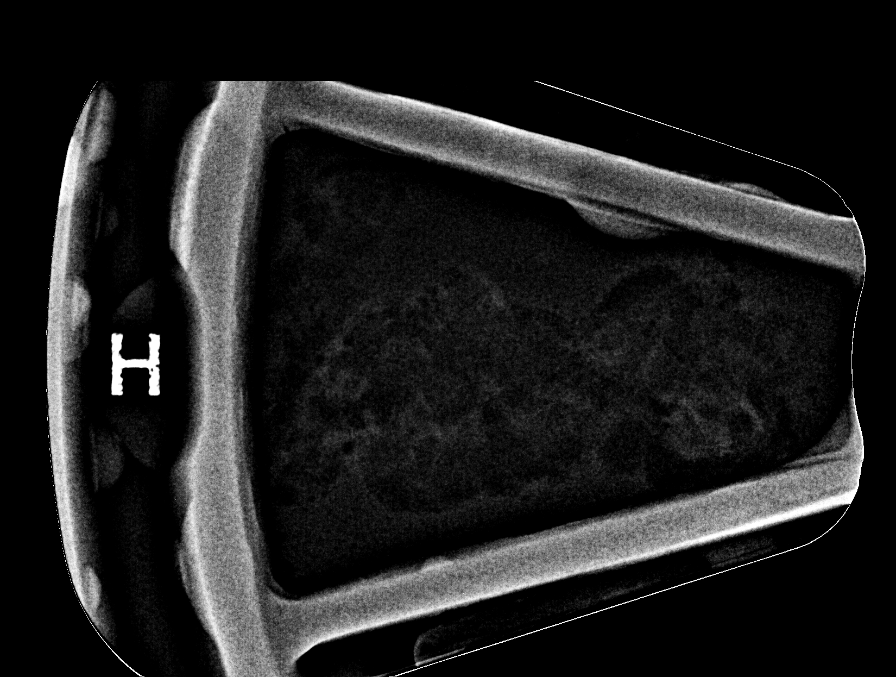

[R (4 of 8)]
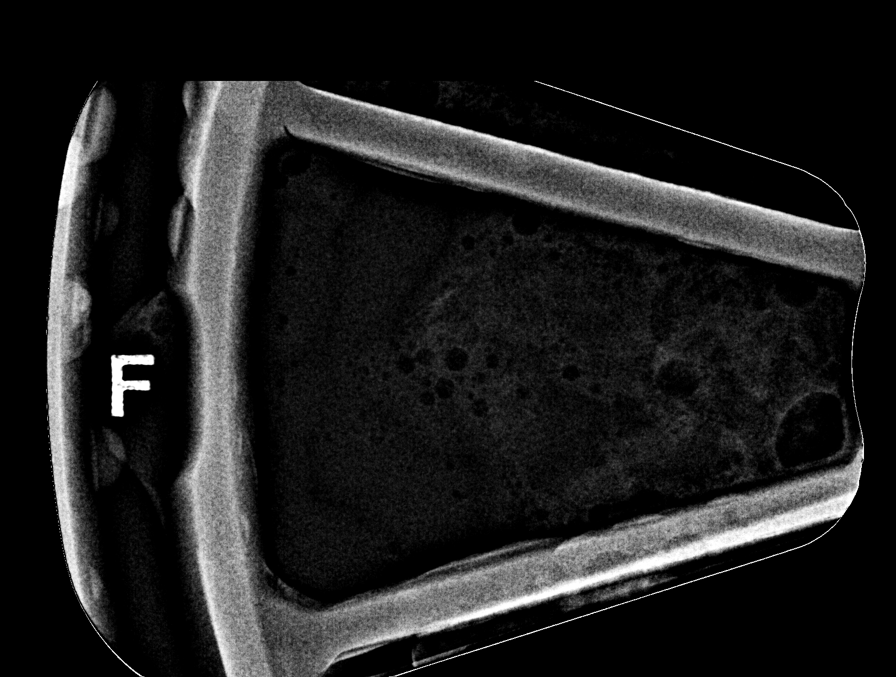

[R (5 of 8)]
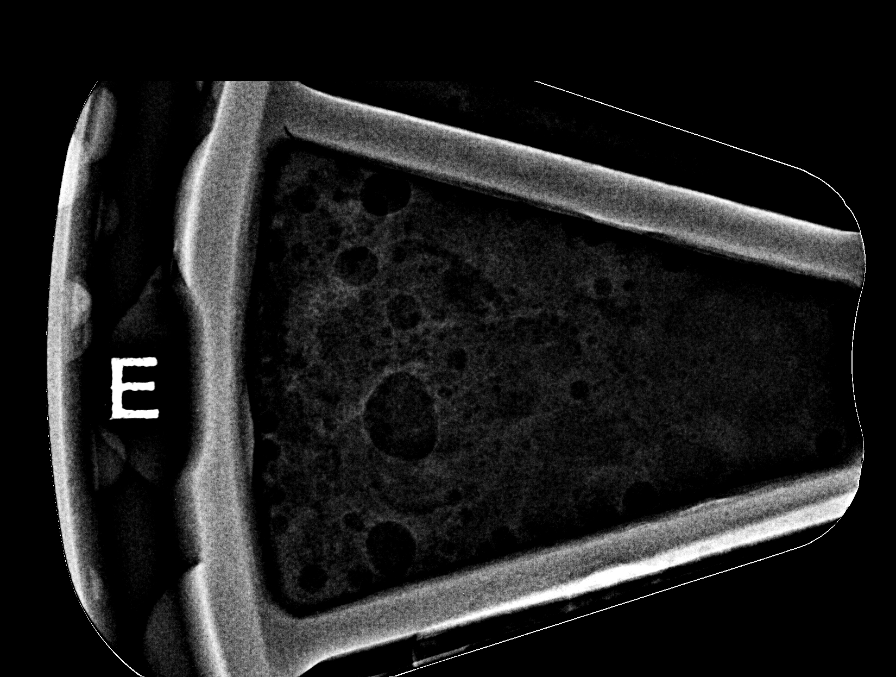

[R (6 of 8)]
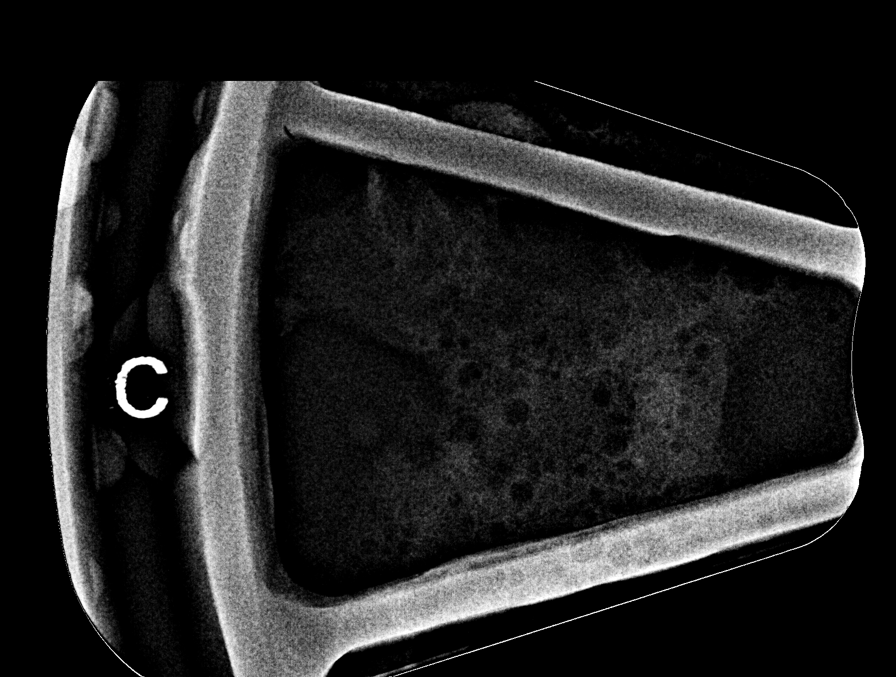

[R (7 of 8)]
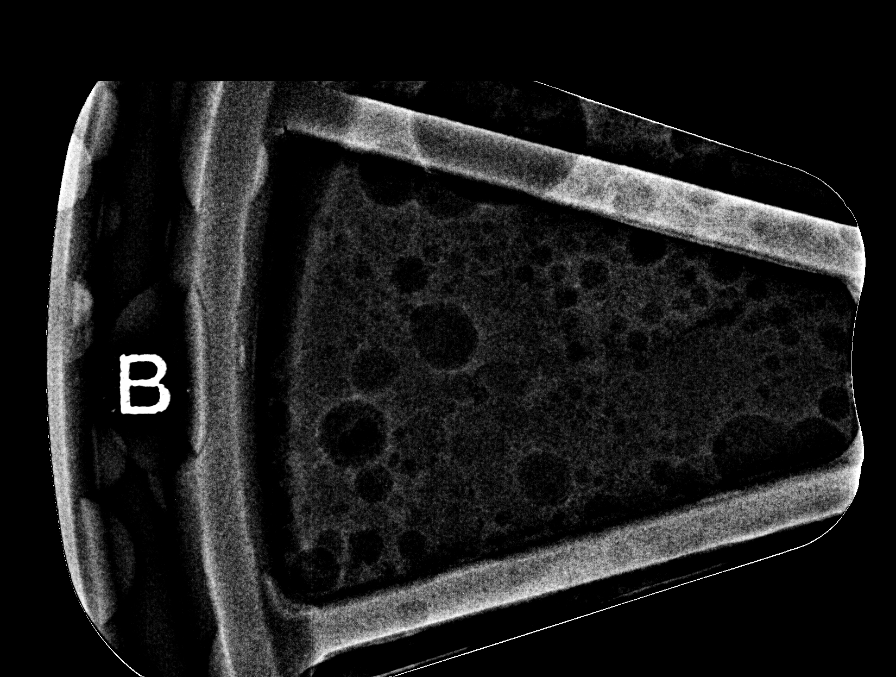

[R (8 of 8)]
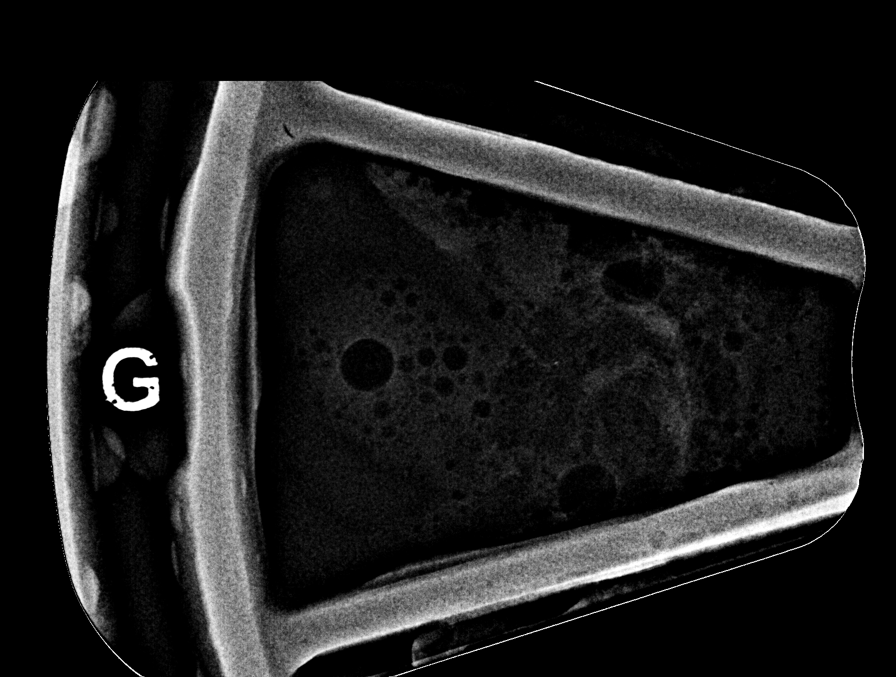

[8 of 19 positions shown; findings below may reference images not displayed]



Using sterile technique and 1% Lidocaine as local anesthetic, under
stereotactic guidance, a 9 gauge vacuum assisted device was used to
perform core needle biopsy of an asymmetry in the medial right
breast using a superior approach.

Lesion quadrant: Upper inner quadrant

At the conclusion of the procedure, a coil shaped tissue marker clip
was deployed into the biopsy cavity. Follow-up 2-view mammogram was
performed and dictated separately.
IMPRESSION: Stereotactic-guided biopsy of the right breast. No apparent
complications.

ADDENDUM:
Pathology revealed ATYPICAL DUCTAL HYPERPLASIA ARISING IN A COMPLEX
SCLEROSING LESION of the Right breast, medial, middle depth. This
was found to be concordant by Dr. Nhlah Larry, with excision
recommended.

Pathology results were discussed with the patient by telephone. The
patient reported doing well after the biopsy with tenderness at the
site. Post biopsy instructions and care were reviewed and questions
were answered. The patient was encouraged to call The [REDACTED]

Surgical consultation has been arranged with Dr. Drey Jim at
[REDACTED] on March 19, 2019.

Pathology results reported by Saaidi Asia, RN on 03/15/2019.



Using sterile technique and 1% Lidocaine as local anesthetic, under
stereotactic guidance, a 9 gauge vacuum assisted device was used to
perform core needle biopsy of an asymmetry in the medial right
breast using a superior approach.

Lesion quadrant: Upper inner quadrant

At the conclusion of the procedure, a coil shaped tissue marker clip
was deployed into the biopsy cavity. Follow-up 2-view mammogram was
performed and dictated separately.
IMPRESSION: Stereotactic-guided biopsy of the right breast. No apparent
complications.

## 2019-06-30 ENCOUNTER — Other Ambulatory Visit: Payer: Self-pay | Admitting: Family Medicine

## 2019-06-30 DIAGNOSIS — I10 Essential (primary) hypertension: Secondary | ICD-10-CM

## 2019-09-28 ENCOUNTER — Other Ambulatory Visit: Payer: Self-pay | Admitting: *Deleted

## 2019-09-28 ENCOUNTER — Other Ambulatory Visit: Payer: Self-pay | Admitting: Neurology

## 2019-09-28 DIAGNOSIS — G35 Multiple sclerosis: Secondary | ICD-10-CM

## 2019-09-28 MED ORDER — TECFIDERA 240 MG PO CPDR: 1.0000 | DELAYED_RELEASE_CAPSULE | Freq: Two times a day (BID) | ORAL | 3 refills | Status: DC

## 2019-11-09 ENCOUNTER — Ambulatory Visit: Payer: Medicare HMO

## 2020-01-07 ENCOUNTER — Other Ambulatory Visit: Payer: Self-pay | Admitting: Family Medicine

## 2020-01-07 DIAGNOSIS — M25569 Pain in unspecified knee: Secondary | ICD-10-CM

## 2020-02-07 ENCOUNTER — Telehealth: Payer: Self-pay | Admitting: Neurology

## 2020-02-07 MED ORDER — DIMETHYL FUMARATE 240 MG PO CPDR: 1.0000 | DELAYED_RELEASE_CAPSULE | Freq: Two times a day (BID) | ORAL | 1 refills | Status: DC

## 2020-02-07 NOTE — Addendum Note (Signed)
Addended by: Noberto Retort C on: 02/07/2020 05:30 PM   Modules accepted: Orders

## 2020-02-07 NOTE — Telephone Encounter (Addendum)
She received a letter from [employer] that brand Tecfidera is not covered on her insurance plan this year. Per vo by Dr. Krista Blue, okay to send in a generic prescription for this medication. The patient has been informed and agreeable to this change.

## 2020-02-07 NOTE — Telephone Encounter (Signed)
Pt has called in re: [place] pt has received a letter from her insurance company stating this medication is no longer covered under her plan.  Pt is asking for a call form RN to discuss.  Pt has scheduled her annual f/u with Dr Krista Blue

## 2020-02-09 NOTE — Telephone Encounter (Signed)
PA started on covermymeds (key: [id number]). Pt has coverage with [employer] [phone]). [id number]. [id number]. Approved through 12/29/2020.

## 2020-02-10 ENCOUNTER — Encounter: Payer: Self-pay | Admitting: Family Medicine

## 2020-02-10 ENCOUNTER — Other Ambulatory Visit: Payer: Self-pay

## 2020-02-10 ENCOUNTER — Ambulatory Visit (INDEPENDENT_AMBULATORY_CARE_PROVIDER_SITE_OTHER): Payer: Medicare HMO | Admitting: Family Medicine

## 2020-02-10 VITALS — BP 130/74 | HR 105 | Ht 64.0 in | Wt 183.0 lb

## 2020-02-10 DIAGNOSIS — I10 Essential (primary) hypertension: Secondary | ICD-10-CM

## 2020-02-10 DIAGNOSIS — N6091 Unspecified benign mammary dysplasia of right breast: Secondary | ICD-10-CM

## 2020-02-10 DIAGNOSIS — Z79899 Other long term (current) drug therapy: Secondary | ICD-10-CM

## 2020-02-10 DIAGNOSIS — Z23 Encounter for immunization: Secondary | ICD-10-CM | POA: Diagnosis not present

## 2020-02-10 DIAGNOSIS — E7800 Pure hypercholesterolemia, unspecified: Secondary | ICD-10-CM

## 2020-02-10 DIAGNOSIS — E038 Other specified hypothyroidism: Secondary | ICD-10-CM

## 2020-02-10 DIAGNOSIS — K30 Functional dyspepsia: Secondary | ICD-10-CM | POA: Diagnosis not present

## 2020-02-10 MED ORDER — FAMOTIDINE 40 MG PO TABS: 40.0000 mg | ORAL_TABLET | Freq: Every day | ORAL | 3 refills | Status: DC

## 2020-02-10 NOTE — Patient Instructions (Signed)
Your blood pressure looks good.  Keep taking your blood pressure medicines  We are checking your Thyroid hormone today.  Dr Danah Reinecke will let you know if you need to change how much thyroid medicine you are taking.   Stop the omeprazole medicine you take every day for indigestion.  Try a another medicine with less side effects as omeprazole.  It is Famotidine.  Take it once a day.  Stop taking the aspirin.  You do not need it.   Dr Detravion Tester will look into whether or not you are to follow up with the general surgeon for the breast biopsy that showed atypical ductal hyperplasia.  Dr Kayleigh Broadwell will remind you next office visit about going for a colonoscopy to follow up the colon polyp you had.    We are checking your cholesterol today to see if your cholesterol lowering medicine, rosuvastatin is working for you.   Dr Loxley Schmale will send a notw to our office folks about your son receiving insurance from the [facility], "[employer]

## 2020-02-11 ENCOUNTER — Encounter: Payer: Self-pay | Admitting: Family Medicine

## 2020-02-11 ENCOUNTER — Telehealth: Payer: Self-pay | Admitting: Family Medicine

## 2020-02-11 DIAGNOSIS — Z86000 Personal history of in-situ neoplasm of breast: Secondary | ICD-10-CM

## 2020-02-11 DIAGNOSIS — N6091 Unspecified benign mammary dysplasia of right breast: Secondary | ICD-10-CM

## 2020-02-11 LAB — TSH: TSH: 1.87 u[IU]/mL (ref 0.450–4.500)

## 2020-02-11 LAB — LIPID PANEL
Chol/HDL Ratio: 2.1 ratio (ref 0.0–4.4)
Cholesterol, Total: 178 mg/dL (ref 100–199)
HDL: 83 mg/dL (ref >39)
LDL Chol Calc (NIH): 78 mg/dL (ref 0–99)
Triglycerides: 97 mg/dL (ref 0–149)
VLDL Cholesterol Cal: 17 mg/dL (ref 5–40)

## 2020-02-11 LAB — CMP14+EGFR
ALT: 18 IU/L (ref 0–32)
AST: 22 IU/L (ref 0–40)
Albumin/Globulin Ratio: 1.6 (ref 1.2–2.2)
Albumin: 4.8 g/dL (ref 3.8–4.8)
Alkaline Phosphatase: 128 IU/L — ABNORMAL HIGH (ref 39–117)
BUN/Creatinine Ratio: 14 (ref 12–28)
BUN: 14 mg/dL (ref 8–27)
Bilirubin Total: 0.8 mg/dL (ref 0.0–1.2)
CO2: 21 mmol/L (ref 20–29)
Calcium: 11.5 mg/dL — ABNORMAL HIGH (ref 8.7–10.3)
Chloride: 102 mmol/L (ref 96–106)
Creatinine, Ser: 0.98 mg/dL (ref 0.57–1.00)
GFR calc Af Amer: 72 mL/min/{1.73_m2} (ref >59)
GFR calc non Af Amer: 62 mL/min/{1.73_m2} (ref >59)
Globulin, Total: 3 g/dL (ref 1.5–4.5)
Glucose: 100 mg/dL — ABNORMAL HIGH (ref 65–99)
Potassium: 3.9 mmol/L (ref 3.5–5.2)
Sodium: 138 mmol/L (ref 134–144)
Total Protein: 7.8 g/dL (ref 6.0–8.5)

## 2020-02-11 NOTE — Assessment & Plan Note (Signed)
Established problem. Stable. To reduce risk adverse PPI effects, recommend change from omeprazole to famotidine Rx famotidine 40 mg daily. If symptoms indigestion return, then may return to omeprazole with a tapered reduction PPI.

## 2020-02-11 NOTE — Assessment & Plan Note (Signed)
Lab Results  Component Value Date   TSH 1.870 02/10/2020   Established problem Controlled Continue current therapy regiment, LT4 100 mcg qhs

## 2020-02-11 NOTE — Telephone Encounter (Signed)
I spoke with Mariah Mariah Jackson by phone about her breast biopsy result (atypical ductal hyperplasia) in 2020 which it was recommended she follow up with general surgery.  I spoke w/ [facility] who reported Mariah Mariah Jackson's appointment with Dr Mariah Jackson on 03/19/19 had been cancelled.   I emphasized to Mariah Jackson that atypical ductal hyperplasia is associated with breast cancer, and that follow up with a general surgeon is very important.   Mariah Jackson said she would be willing to consult a general surgeon.    I made a referral for ambulatory surgery consult at [place].

## 2020-02-11 NOTE — Assessment & Plan Note (Signed)
Lipid Panel     Component Value Date/Time   CHOL 178 02/10/2020 1121   TRIG 97 02/10/2020 1121   HDL 83 02/10/2020 1121   CHOLHDL 2.1 02/10/2020 1121   CHOLHDL 2.2 08/29/2016 1227   VLDL 22 08/29/2016 1227   LDLCALC 78 02/10/2020 1121   LDLDIRECT 68 10/29/2018 1027   LABVLDL 17 02/10/2020 1121   Established problem Controlled Continue current therapy regiment.

## 2020-02-11 NOTE — Progress Notes (Signed)
Visit Problem List with A/P  Atypical ductal hyperplasia of right breast Established problem Ms Brar's ambulatory general surgery consultation 03/19/19 appointment to follow up the right breast core needle biopsy that showed atypical ductal hyperplasiad  was canceled.  After discussing the increase risk of breast cancer that can be associated with this biopsy finding, Ms Gallick agreed to consult with general surgery.   Amb Ref to [facility] ordered.   Essential hypertension Established problem. Adequate blood pressure control.  No evidence of new end organ damage.  Tolerating medication without significant adverse effects.  Plan to continue current blood pressure regiment.  ]  Hypothyroidism Lab Results  Component Value Date   TSH 1.870 02/10/2020   Established problem Controlled Continue current therapy regiment, LT4 100 mcg qhs    Pure hypercholesterolemia Lipid Panel     Component Value Date/Time   CHOL 178 02/10/2020 1121   TRIG 97 02/10/2020 1121   HDL 83 02/10/2020 1121   CHOLHDL 2.1 02/10/2020 1121   CHOLHDL 2.2 08/29/2016 1227   VLDL 22 08/29/2016 1227   LDLCALC 78 02/10/2020 1121   LDLDIRECT 68 10/29/2018 1027   LABVLDL 17 02/10/2020 1121   Established problem Controlled Continue current therapy regiment.   Acid indigestion Established problem. Stable. To reduce risk adverse PPI effects, recommend change from omeprazole to famotidine Rx famotidine 40 mg daily. If symptoms indigestion return, then may return to omeprazole with a tapered reduction PPI.    Hypercalcemia, persistent Established problem Steady trajectory rise in serum calcium from 10.3 (01/2018) to 11.5 now. IPTH las March was in high normal range with SCa++ 11.0.   Possible contributor included Ms Jeppsen's Dyazide med, primary hyperparathyroidism, and less likely a hypercalcemia of malignancy related to her core biopsy of atypical ductal hyperplasia.   Pt remains asymptomatic.  Recommend  stopping Dyazide, recheck iPTH/Ca++ levels, and follow up Ms Blount's general surgery consultation about her atypical ductal hyperplasia biopsy results.

## 2020-02-11 NOTE — Assessment & Plan Note (Signed)
Established problem Steady trajectory rise in serum calcium from 10.3 (01/2018) to 11.5 now. IPTH las March was in high normal range with SCa++ 11.0.   Possible contributor included Ms Holmer's Dyazide med, primary hyperparathyroidism, and less likely a hypercalcemia of malignancy related to her core biopsy of atypical ductal hyperplasia.   Pt remains asymptomatic.  Recommend stopping Dyazide, recheck iPTH/Ca++ levels, and follow up Ms Pollman's general surgery consultation about her atypical ductal hyperplasia biopsy results.

## 2020-02-11 NOTE — Assessment & Plan Note (Signed)
Established problem Mariah Jackson's ambulatory general surgery consultation 03/19/19 appointment to follow up the right breast core needle biopsy that showed atypical ductal hyperplasiad  was canceled.  After discussing the increase risk of breast cancer that can be associated with this biopsy finding, Mariah Jackson agreed to consult with general surgery.   Amb Ref to [facility] ordered.

## 2020-02-11 NOTE — Assessment & Plan Note (Signed)
Established problem. Adequate blood pressure control.  No evidence of new end organ damage.  Tolerating medication without significant adverse effects.  Plan to continue current blood pressure regiment.  ]

## 2020-02-14 ENCOUNTER — Telehealth: Payer: Self-pay | Admitting: Family Medicine

## 2020-02-14 ENCOUNTER — Other Ambulatory Visit: Payer: Self-pay | Admitting: Family Medicine

## 2020-02-14 NOTE — Telephone Encounter (Signed)
Per Mariah Jackson's request, I sent a message to Mariah Jackson requesting her to assist Mariah Jackson apply for enrollment in the [facility] program.

## 2020-02-14 NOTE — Telephone Encounter (Signed)
I spoke with Mariah Jackson about her persistently elevated serum calcium. She is asymptomatic Plan: Stop Aldactazide  The [facility] lab for iPTH/Calcium/PO4 level in 6 weeks. (Put in tickler note)

## 2020-02-16 ENCOUNTER — Other Ambulatory Visit: Payer: Self-pay | Admitting: Family Medicine

## 2020-02-16 DIAGNOSIS — E038 Other specified hypothyroidism: Secondary | ICD-10-CM

## 2020-02-19 ENCOUNTER — Other Ambulatory Visit: Payer: Self-pay | Admitting: Family Medicine

## 2020-03-14 ENCOUNTER — Ambulatory Visit: Payer: Medicare HMO | Admitting: Neurology

## 2020-03-14 ENCOUNTER — Other Ambulatory Visit: Payer: Self-pay

## 2020-03-14 ENCOUNTER — Encounter: Payer: Self-pay | Admitting: Neurology

## 2020-03-14 VITALS — BP 146/88 | HR 87 | Temp 97.4°F | Ht 64.0 in | Wt 190.0 lb

## 2020-03-14 DIAGNOSIS — M62838 Other muscle spasm: Secondary | ICD-10-CM | POA: Insufficient documentation

## 2020-03-14 DIAGNOSIS — R269 Unspecified abnormalities of gait and mobility: Secondary | ICD-10-CM | POA: Insufficient documentation

## 2020-03-14 DIAGNOSIS — G35 Multiple sclerosis: Secondary | ICD-10-CM

## 2020-03-14 MED ORDER — GABAPENTIN 100 MG PO CAPS: 100.0000 mg | ORAL_CAPSULE | Freq: Three times a day (TID) | ORAL | 11 refills | Status: DC

## 2020-03-14 MED ORDER — DIMETHYL FUMARATE 240 MG PO CPDR: 1.0000 | DELAYED_RELEASE_CAPSULE | Freq: Two times a day (BID) | ORAL | 4 refills | Status: DC

## 2020-03-14 MED ORDER — DALFAMPRIDINE ER 10 MG PO TB12: 10.0000 mg | ORAL_TABLET | Freq: Two times a day (BID) | ORAL | 4 refills | Status: DC

## 2020-03-14 NOTE — Progress Notes (Signed)
[facility]  PATIENT: Mariah Jackson DOB: 1961/03/25  HISTORY OF PRESENT ILLNESS: HISTORY: Mariah Jackson, 59 year old female returns for followup for RRMS  She was diagnosed with multiple sclerosis in 2001, by Dr. Dellis Filbert at [facility]. The disease has primarily affected her balance and gait. She could only clarify one exacerbation, presenting as worsening balance difficulty, and says she has never been treated with intravenous steroids.   She was placed on Betaseron shortly after diagnosis, and has been on that drug ever since. Her gait difficulty had little changes over the  years. She ambulates with a cane, but is able to get around the house pretty well without it.   Most recent MRI was in January 2011,shows multiple periventricular, periatrial and subcortical white matter hyperintensities suggestive of demyelinating disease.No enhancing lesions or significant atrophy is seen. compared to MRI films from Aug 2000 there appear to be more T2 lesions in right frontal subcortical region.   She is tired of taking the shots and does have multiple areas of lipoatrophy, especially on the abdominal area. She is ambulating with a cane. She  switched to Tecfidera since Oct 2014, doing well.  UPDATE Sep 18th 2015: She denies signficant side effect from Tecfidera. She has been on Ampyra 10mg  bid for few years, which definitely has helped her walking  MRI brain March 2014: Multiple periventricular and subcortical chronic demyelinating disease. No acute plaques are seen. MRI cervical spine: Disc bulging at C5-6. Spondylosis at C5-6 and C6-7. C5-6 there is focal disc protrusion slightly deforms anterior spinal cord, with spinal stenosis or foraminal stenosis. No intrinsic or enhancing spinal cord lesions.  UPDATE March 17th 2016: She fell in Feb 2016, now with right shoulder and neck pain, overall has improved, she is continue taking Tecfidera, Ampyra, denies significant side effect,  ambulate with spastic gait, wear her right ankle brace.  UPDATE July 16 2017: She is overall doing well, no flareup, continue have gait abnormality, complains of bilateral knee pain, has valgrus, reviewed laboratory evaluations, normal CMP, elevated alkaline phosphate 151, mild elevated glucose 101, CBC showed low MCV 75, normal TSH  Update March 16, 2018:  She is overall doing very well, no significant change, continue taking Ampyra, using of one-point cane, unsteady gait,  I have personally reviewed MRI of brain with and without contrast in August 2018, multiple T2/FLAIR hyperintensity lesions at both hemisphere, no contrast enhancement, there was no infratentorial lesions, no significant change compared to previous scans.  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  I personally reviewed most recent MRI of brain with without contrast in August 2018, multiple T2/flair hyperintensity foci in the white matter of both hemispheres, with large confluences in the periatrial white matter bilaterally,  Laboratory evaluations in 2020 showed normal TSH, elevated calcium 11, normal creatinine 0.94, normal PTH 63  UPDATE March 14 2020: Her brother drove her to clinic today, she is doing well, ambulate with a cane or walker for longer distance, denies bowel and bladder incontinence, complains of occasional bilateral calf  muscle tightness, especially during morning times, she tolerating Tecfidera twice a day well, also  taking 10 mg twice a day  She denies significant worsening of functional status, actually works better  REVIEW OF SYSTEMS: Full 14 system review of systems performed and notable only for those listed, all others are neg:    ALLERGIES: Allergies  Allergen Reactions  . Vicodin [Hydrocodone-Acetaminophen] Nausea Only    HOME MEDICATIONS: Outpatient Medications Prior to Visit  Medication Sig Dispense Refill  . amLODipine (NORVASC) 10 MG tablet TAKE 1 TABLET (10 MG TOTAL) BY MOUTH DAILY. 90 tablet 3  . celecoxib (CELEBREX) 200 MG capsule TAKE 1 CAPSULE TWICE DAILY 180 capsule 3  . dalfampridine (AMPYRA) 10 MG TB12 Take 1 tablet (10 mg total) by mouth every 12 (twelve) hours. 180 tablet 3  . Dimethyl Fumarate 240 MG CPDR Take 1 capsule (240 mg total) by mouth 2 (two) times daily. 60 capsule 1  . famotidine (PEPCID) 40 MG tablet Take 1 tablet (40 mg total) by mouth daily. 90 tablet 3  . levothyroxine (SYNTHROID) 100 MCG tablet TAKE 1 TABLET EVERY DAY 90 tablet 3  . Multiple Vitamin (MULTIVITAMIN) tablet Take 1 tablet by mouth daily.    . rosuvastatin (CRESTOR) 10 MG tablet TAKE 1 TABLET EVERY DAY 90 tablet 3   No facility-administered medications prior to visit.    PAST MEDICAL HISTORY: Past Medical History:  Diagnosis Date  . Abnormality of gait 09/10/2013  . Arthritis   . Breast cancer ([place]) 2007   left  . 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 - SEE ONCOLOGY TABLE.  COMMENT ONCOLOGY TABLE-BREAST, IN SITU CARCINOMA ONLY  . Essential hypertension 11/09/2014  . GERD (gastroesophageal reflux disease)   . History of breast cancer 12/30/2005  . History of breast lump/mass excision 11/09/2014   Lumpectomy 2007. Pathology report non-cancerous, fibrous tissue.     Marland Kitchen History of ductal carcinoma in situ (DCIS) of breast 03/05/2019   Excision Biopsy 2017: DUCTAL CARCINOMA IN SITU, INTERMEDIATE GRADE WITH FOCAL NECROSIS, 2 CM. Estrogen receptor positive Progesterone receptor positive 8. TNM: pTispNXpMX  . Hyperlipidemia   . Hypertension   . Hypothyroidism   . Multinodular goiter (nontoxic) 08/29/2016  . Multiple sclerosis ([place])   . Multiple sclerosis, relapsing-remitting ([place]) 09/10/2013  . Personal history of radiation therapy   . Pure hypercholesterolemia 11/09/2014  . Thyroid enlargement 11/09/2014   Right sided thyroid enlargement      PAST SURGICAL HISTORY: Past Surgical History:  Procedure Laterality Date  . BREAST LUMPECTOMY  2007   left, radiation  . MM BREAST STEREO BX*L*R/S Right 03/12/2019   RIGHT BREAST STEREOTACTIC CORE NEEDLE BIOPSY  . TUBAL LIGATION  1985    FAMILY HISTORY: Family History  Problem Relation Age of Onset  . Stroke Father   . Hypertension Mother   . Pneumonia Mother   . Hypertension Sister   . Hypertension Brother   . Diabetes Brother 64       Prediabetic  . Hypertension Brother   . Hypertension Brother   . Hypertension Brother   . Hypertension Brother   . Hypertension Sister   . Colon cancer Neg Hx   . Breast cancer Neg Hx     SOCIAL HISTORY: Social History   Socioeconomic History  .  Marital status: Divorced    Spouse name: Not on file  . Number of children: 2  . Years of education: 23  . Highest education level: 12th grade  Occupational History  . Occupation: Unemployed   . Occupation: Disabiliy  Tobacco Use  . Smoking status: Never Smoker  . Smokeless tobacco: Never Used  . Tobacco comment: no plans to start  Substance and Sexual Activity  . Alcohol use: No  . Drug use: No  . Sexual activity: Not Currently  Other Topics Concern  . Not on file  Social History Narrative   Patient a high school education.    Patient is unemployed.   Patient has 2 children.  Patient lives in her home  with brother and son. One story home, ramp at back, 3-4 steps in front. Has grab bar at tub but not toilet. Pets: 2 dogs, Miss Honey and Miss [place].   Smoke alarms in home. Carpet in home, bath mats in bathroom.  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    Patient is divorced.    Eats small meals during day. Meat, veggies, fruit. Has trouble staying asleep. Wakes up about 4 am after going to bed at 1030 pm.   Mobility:  Cane or walker   Transportation: [facility] or family member   Caregiver: daughter    Emergency Contact: daughter, [name], CNA   Advance Directive:  None   Resources:   -Financial payment plan with the neurology office.   -[facility] program participate for MS which covers some medical expense for patient.         DME: cane, walker, BP monitor (digital), partial plate (upper), eyeglasses, ankle brace   Social Determinants of Health   Financial Resource Strain:   . Difficulty of Paying Living Expenses:   Food Insecurity:   . Worried About Charity fundraiser in the Last Year:   . Arboriculturist in the Last Year:   Transportation Needs:   . Film/video editor (Medical):   Marland Kitchen Lack of Transportation (Non-Medical):   Physical Activity:   . Days of Exercise per Week:   . Minutes of Exercise per Session:   Stress:   . Feeling of Stress :   Social Connections:   . Frequency of Communication with Friends and Family:   . Frequency of Social Gatherings with Friends and Family:   . Attends Religious Services:   . Active Member of Clubs or Organizations:   . Attends Archivist Meetings:   Marland Kitchen Marital Status:   Intimate Partner Violence:   . Fear of Current or Ex-Partner:   . Emotionally Abused:   Marland Kitchen Physically Abused:   . Sexually Abused:      PHYSICAL EXAM  Vitals:   03/14/20 1110  BP: (!) 146/88  Pulse: 87  Temp: (!) 97.4 F (36.3 C)  Weight: 190 lb (86.2 kg)  Height: 5\' 4"   (1.626 m)   Body mass index is 32.61 kg/m.  PHYSICAL EXAMNIATION:  Gen: NAD, conversant, well nourised, well groomed                     Cardiovascular: Regular rate rhythm, no peripheral edema, warm, nontender. Eyes: Conjunctivae clear without exudates or hemorrhage Neck: Supple, no carotid bruits. Pulmonary: Clear to auscultation bilaterally   NEUROLOGICAL EXAM:  MENTAL STATUS: Speech:    Speech is normal; fluent and  spontaneous with normal comprehension.  Cognition:     Orientation to time, place and person     Normal recent and remote memory     Normal Attention span and concentration     Normal Language, naming, repeating,spontaneous speech     Fund of knowledge   CRANIAL NERVES: CN II: Visual fields are full to confrontation.  Pupils are round equal and briskly reactive to light. CN III, IV, VI: extraocular movement are normal. No ptosis. CN V: Facial sensation is intact to pinprick in all 3 divisions bilaterally. Corneal responses are intact.  CN VII: Face is symmetric with normal eye closure and smile. CN VIII: Hearing is normal to rubbing fingers CN IX, X: Palate elevates symmetrically. Phonation is normal. CN XI: Head turning and shoulder shrug are intact   MOTOR: Bilateral upper extremity motor strength is normal, mild spasticity of bilateral lower extremity  REFLEXES: Reflexes are present and symmetric at the biceps, triceps, hyperreflexia at bilateral knees, and ankles. Plantar responses are flexor.  SENSORY: Intact to light touch bilateral lower extremity  COORDINATION: Rapid alternating movements and fine finger movements are intact. There is no dysmetria on finger-to-nose and heel-knee-shin.    GAIT/STANCE: She rely on her walker to get up from seated position, hyperextension of bilateral knee, wide-based, unsteady  DIAGNOSTIC DATA (LABS, IMAGING, TESTING) - I reviewed patient records, labs, notes, testing and imaging myself where available.       Component Value Date/Time   NA 138 02/10/2020 1121   K 3.9 02/10/2020 1121   CL 102 02/10/2020 1121   CO2 21 02/10/2020 1121   GLUCOSE 100 (H) 02/10/2020 1121   GLUCOSE 91 08/29/2016 1227   BUN 14 02/10/2020 1121   CREATININE 0.98 02/10/2020 1121   CREATININE 0.82 08/29/2016 1227   CALCIUM 11.5 (H) 02/10/2020 1121   PROT 7.8 02/10/2020 1121   ALBUMIN 4.8 02/10/2020 1121   AST 22 02/10/2020 1121   ALT 18 02/10/2020 1121   ALKPHOS 128 (H) 02/10/2020 1121   BILITOT 0.8 02/10/2020 1121   GFRNONAA 62 02/10/2020 1121   GFRNONAA 80 08/29/2016 1227   GFRAA 72 02/10/2020 1121   GFRAA >89 08/29/2016 1227    ASSESSMENT AND PLAN  Relapsing remitting multiple sclerosis  Overall stable, she denies decline of her functional status over the past few years  I personally reviewed MRI of the brain with and without contrast in August 2018 showed evidence of supratentorium MS lesions, no contrast enhancement, no change compared to previous MRIs  laboratory evaluations in 2021 showed normal CMP with exception of elevated calcium 11.5, chloride phosphate 128, normal TSH,  continue Tecfidera  Repeat MRI of the brain with without contrast  Gait abnormality, bilateral knee pain   continue ampyra  Celebrex 100mg  prn  Bilateral lower extremity spasticity,  calf muscle tightness  Gabapentin 100 mg 3 times a day  Marcial Pacas, M.D. Ph.D.  [facility] [place], [place] [address] Phone: [phone] Fax:      [phone]

## 2020-03-21 ENCOUNTER — Telehealth: Payer: Self-pay | Admitting: Neurology

## 2020-03-21 NOTE — Telephone Encounter (Signed)
Mcarthur Rossetti Josem Kaufmann: [id number] (exp. 03/21/20 to 04/20/20) order sent to GI. They will reach out to the patient to schedule.

## 2020-03-29 ENCOUNTER — Other Ambulatory Visit: Payer: Self-pay | Admitting: Family Medicine

## 2020-04-01 ENCOUNTER — Other Ambulatory Visit: Payer: Self-pay | Admitting: Neurology

## 2020-04-01 DIAGNOSIS — G35 Multiple sclerosis: Secondary | ICD-10-CM

## 2020-04-03 ENCOUNTER — Other Ambulatory Visit: Payer: Self-pay | Admitting: Family Medicine

## 2020-04-03 NOTE — Progress Notes (Signed)
Labs forwu hypercalcemia

## 2020-04-05 ENCOUNTER — Telehealth: Payer: Self-pay | Admitting: *Deleted

## 2020-04-05 ENCOUNTER — Other Ambulatory Visit: Payer: Self-pay

## 2020-04-05 ENCOUNTER — Other Ambulatory Visit: Payer: Medicare HMO

## 2020-04-05 NOTE — Telephone Encounter (Signed)
PA for dalfampridin ER started through covermymeds ([id number]). She has insurance w/ [employer]. Pharmacy coverage w/ [facility]. Pt [id number]. Decision pending.

## 2020-04-05 NOTE — Telephone Encounter (Signed)
PA approved through 12/29/2020. Call [phone] w/ any questions.

## 2020-04-06 LAB — CA+CREAT+P+PTH INTACT
Calcium: 10.5 mg/dL — ABNORMAL HIGH (ref 8.7–10.3)
Creatinine, Ser: 0.84 mg/dL (ref 0.57–1.00)
GFR calc Af Amer: 87 mL/min/{1.73_m2} (ref >59)
GFR calc non Af Amer: 75 mL/min/{1.73_m2} (ref >59)
PTH: 66 pg/mL — ABNORMAL HIGH (ref 15–65)
Phosphorus: 2.8 mg/dL — ABNORMAL LOW (ref 3.0–4.3)

## 2020-04-10 ENCOUNTER — Telehealth: Payer: Self-pay | Admitting: Family Medicine

## 2020-04-10 DIAGNOSIS — E210 Primary hyperparathyroidism: Secondary | ICD-10-CM | POA: Insufficient documentation

## 2020-04-10 NOTE — Assessment & Plan Note (Signed)
Slightly elevated iPTH with slightly high serum calcium c/w primary hyperparathyroidism.   Currently asymptomatic, will monitor annually or is symptoms arise.   [name] may return to taking her spironlactone-hctz antihypertensive.

## 2020-04-10 NOTE — Telephone Encounter (Signed)
Informed Mariah Jackson that her mild incr Calcium level likely mild hyperparathyroidism.   Slightly elevated iPTH with slightly high serum calcium c/w primary hyperparathyroidism.   Currently asymptomatic, will monitor annually or is symptoms arise.   Mariah Jackson may return to taking her spironlactone-hctz antihypertensive.

## 2020-04-11 NOTE — Telephone Encounter (Signed)
Pt has called re: her new insurance information so  her medication: dalfampridine 10 MG TB12 and all others can be applied to it [employer] Plan HMO [id number] RX [id number] RX [id number] This is FYI, pt has not asked for a call back.

## 2020-04-25 ENCOUNTER — Other Ambulatory Visit: Payer: Self-pay | Admitting: *Deleted

## 2020-04-25 DIAGNOSIS — G35 Multiple sclerosis: Secondary | ICD-10-CM

## 2020-04-25 MED ORDER — DALFAMPRIDINE ER 10 MG PO TB12: 10.0000 mg | ORAL_TABLET | Freq: Two times a day (BID) | ORAL | 3 refills | Status: DC

## 2020-04-25 MED ORDER — DIMETHYL FUMARATE 240 MG PO CPDR: 1.0000 | DELAYED_RELEASE_CAPSULE | Freq: Two times a day (BID) | ORAL | 3 refills | Status: DC

## 2020-04-25 MED ORDER — GABAPENTIN 100 MG PO CAPS: 100.0000 mg | ORAL_CAPSULE | Freq: Three times a day (TID) | ORAL | 3 refills | Status: DC

## 2020-04-27 ENCOUNTER — Other Ambulatory Visit: Payer: Self-pay

## 2020-04-27 DIAGNOSIS — E038 Other specified hypothyroidism: Secondary | ICD-10-CM

## 2020-04-27 MED ORDER — LEVOTHYROXINE SODIUM 100 MCG PO TABS: 100.0000 ug | ORAL_TABLET | Freq: Every day | ORAL | 3 refills | Status: DC

## 2020-04-27 NOTE — Telephone Encounter (Signed)
Patient calls nurse line requesting refill on synthroid. Patient states that she has had a switch in insurance and she now has to use the [facility] [employer] [facility].   To PCP Talbot Grumbling, RN

## 2020-04-28 ENCOUNTER — Other Ambulatory Visit: Payer: Self-pay

## 2020-04-28 DIAGNOSIS — M25569 Pain in unspecified knee: Secondary | ICD-10-CM

## 2020-04-28 MED ORDER — OMEPRAZOLE 20 MG PO CPDR: 20.0000 mg | DELAYED_RELEASE_CAPSULE | Freq: Every day | ORAL | 3 refills | Status: DC

## 2020-04-28 MED ORDER — ROSUVASTATIN CALCIUM 10 MG PO TABS: 10.0000 mg | ORAL_TABLET | Freq: Every day | ORAL | 3 refills | Status: DC

## 2020-04-28 MED ORDER — AMLODIPINE BESYLATE 10 MG PO TABS: 10.0000 mg | ORAL_TABLET | Freq: Every day | ORAL | 3 refills | Status: DC

## 2020-04-28 MED ORDER — SPIRONOLACTONE-HCTZ 25-25 MG PO TABS: 1.0000 | ORAL_TABLET | Freq: Every day | ORAL | 3 refills | Status: DC

## 2020-04-28 MED ORDER — CELECOXIB 200 MG PO CAPS: 200.0000 mg | ORAL_CAPSULE | Freq: Two times a day (BID) | ORAL | 3 refills | Status: DC

## 2020-05-05 ENCOUNTER — Other Ambulatory Visit: Payer: Self-pay | Admitting: Family Medicine

## 2020-05-05 DIAGNOSIS — K219 Gastro-esophageal reflux disease without esophagitis: Secondary | ICD-10-CM

## 2020-05-05 DIAGNOSIS — I10 Essential (primary) hypertension: Secondary | ICD-10-CM

## 2020-05-05 DIAGNOSIS — E7800 Pure hypercholesterolemia, unspecified: Secondary | ICD-10-CM

## 2020-05-05 DIAGNOSIS — M25569 Pain in unspecified knee: Secondary | ICD-10-CM

## 2020-05-05 MED ORDER — SPIRONOLACTONE-HCTZ 25-25 MG PO TABS: 1.0000 | ORAL_TABLET | Freq: Every day | ORAL | 3 refills | Status: DC

## 2020-05-05 MED ORDER — CELECOXIB 200 MG PO CAPS: 200.0000 mg | ORAL_CAPSULE | Freq: Two times a day (BID) | ORAL | 3 refills | Status: DC

## 2020-05-05 MED ORDER — OMEPRAZOLE 20 MG PO CPDR: 20.0000 mg | DELAYED_RELEASE_CAPSULE | Freq: Every day | ORAL | 3 refills | Status: DC

## 2020-05-05 MED ORDER — AMLODIPINE BESYLATE 10 MG PO TABS: 10.0000 mg | ORAL_TABLET | Freq: Every day | ORAL | 3 refills | Status: DC

## 2020-05-05 MED ORDER — ROSUVASTATIN CALCIUM 10 MG PO TABS: 10.0000 mg | ORAL_TABLET | Freq: Every day | ORAL | 3 refills | Status: DC

## 2020-05-10 DIAGNOSIS — I951 Orthostatic hypotension: Secondary | ICD-10-CM | POA: Diagnosis not present

## 2020-05-10 DIAGNOSIS — R269 Unspecified abnormalities of gait and mobility: Secondary | ICD-10-CM | POA: Diagnosis not present

## 2020-05-10 DIAGNOSIS — K219 Gastro-esophageal reflux disease without esophagitis: Secondary | ICD-10-CM | POA: Diagnosis not present

## 2020-05-10 DIAGNOSIS — G35 Multiple sclerosis: Secondary | ICD-10-CM | POA: Diagnosis not present

## 2020-05-10 DIAGNOSIS — E669 Obesity, unspecified: Secondary | ICD-10-CM | POA: Diagnosis not present

## 2020-05-10 DIAGNOSIS — M1990 Unspecified osteoarthritis, unspecified site: Secondary | ICD-10-CM | POA: Diagnosis not present

## 2020-05-10 DIAGNOSIS — E039 Hypothyroidism, unspecified: Secondary | ICD-10-CM | POA: Diagnosis not present

## 2020-05-10 DIAGNOSIS — E785 Hyperlipidemia, unspecified: Secondary | ICD-10-CM | POA: Diagnosis not present

## 2020-05-10 DIAGNOSIS — G8929 Other chronic pain: Secondary | ICD-10-CM | POA: Diagnosis not present

## 2020-05-10 DIAGNOSIS — I10 Essential (primary) hypertension: Secondary | ICD-10-CM | POA: Diagnosis not present

## 2020-05-11 ENCOUNTER — Telehealth: Payer: Self-pay | Admitting: Family Medicine

## 2020-05-11 NOTE — Telephone Encounter (Signed)
Pt dropped off forms to be completed by the doctor. I have placed the form in the blue team folder at the front desk. jw

## 2020-05-11 NOTE — Telephone Encounter (Signed)
Clinical info completed on scat form.  Place form in Dr. McDiarmid's box for completion.  Anitra Doxtater, CMA

## 2020-05-15 NOTE — Telephone Encounter (Signed)
Patient called and informed forms were ready to be picked up. Copy made and placed in batch scanning. Original placed at front desk.   Talbot Grumbling, RN

## 2020-05-15 NOTE — Telephone Encounter (Signed)
Form completed and placed in [facility] nurses box.

## 2020-05-22 NOTE — Telephone Encounter (Signed)
Patient Mariah Jackson on nurse line stating she would now like the form mailed to her home. Form has been placed in the mail.

## 2020-07-25 DIAGNOSIS — H524 Presbyopia: Secondary | ICD-10-CM | POA: Diagnosis not present

## 2020-10-12 ENCOUNTER — Telehealth: Payer: Self-pay | Admitting: Neurology

## 2020-10-12 DIAGNOSIS — R269 Unspecified abnormalities of gait and mobility: Secondary | ICD-10-CM

## 2020-10-12 DIAGNOSIS — G35 Multiple sclerosis: Secondary | ICD-10-CM

## 2020-10-12 NOTE — Telephone Encounter (Signed)
Pt called, could Dr. Krista Blue write me a prescription for a rollator?  The one I have the brakes are damaged. Would like a call from the nurse.

## 2020-10-12 NOTE — Telephone Encounter (Signed)
Rx for rollator printed and signed by MD. The patient would like to come to the office to pick it up. I provided her with our office hours and placed it up front.

## 2020-10-19 NOTE — Telephone Encounter (Signed)
Pt called, the insurance would not cover the rollator. [employer] will pay for my rollator, can you fax the prescription to the [facility] at [phone]?

## 2020-10-19 NOTE — Telephone Encounter (Signed)
New rx for rollator printed, signed by Dr. Krista Blue, faxed and confirmed to the [facility]. The patient is aware her request has been completed.

## 2021-01-12 DIAGNOSIS — K219 Gastro-esophageal reflux disease without esophagitis: Secondary | ICD-10-CM | POA: Diagnosis not present

## 2021-01-12 DIAGNOSIS — G35 Multiple sclerosis: Secondary | ICD-10-CM | POA: Diagnosis not present

## 2021-01-12 DIAGNOSIS — E785 Hyperlipidemia, unspecified: Secondary | ICD-10-CM | POA: Diagnosis not present

## 2021-01-12 DIAGNOSIS — Z008 Encounter for other general examination: Secondary | ICD-10-CM | POA: Diagnosis not present

## 2021-01-12 DIAGNOSIS — R269 Unspecified abnormalities of gait and mobility: Secondary | ICD-10-CM | POA: Diagnosis not present

## 2021-01-12 DIAGNOSIS — G8929 Other chronic pain: Secondary | ICD-10-CM | POA: Diagnosis not present

## 2021-01-12 DIAGNOSIS — M1990 Unspecified osteoarthritis, unspecified site: Secondary | ICD-10-CM | POA: Diagnosis not present

## 2021-01-12 DIAGNOSIS — K5900 Constipation, unspecified: Secondary | ICD-10-CM | POA: Diagnosis not present

## 2021-01-12 DIAGNOSIS — I10 Essential (primary) hypertension: Secondary | ICD-10-CM | POA: Diagnosis not present

## 2021-01-12 DIAGNOSIS — E039 Hypothyroidism, unspecified: Secondary | ICD-10-CM | POA: Diagnosis not present

## 2021-01-12 DIAGNOSIS — E663 Overweight: Secondary | ICD-10-CM | POA: Diagnosis not present

## 2021-03-08 ENCOUNTER — Other Ambulatory Visit: Payer: Self-pay | Admitting: Neurology

## 2021-03-14 ENCOUNTER — Ambulatory Visit: Payer: Medicare HMO | Admitting: Neurology

## 2021-04-16 ENCOUNTER — Other Ambulatory Visit: Payer: Self-pay | Admitting: Family Medicine

## 2021-04-16 DIAGNOSIS — K219 Gastro-esophageal reflux disease without esophagitis: Secondary | ICD-10-CM

## 2021-05-24 ENCOUNTER — Ambulatory Visit (INDEPENDENT_AMBULATORY_CARE_PROVIDER_SITE_OTHER): Payer: Medicare HMO | Admitting: Family Medicine

## 2021-05-24 ENCOUNTER — Other Ambulatory Visit: Payer: Self-pay

## 2021-05-24 ENCOUNTER — Encounter: Payer: Self-pay | Admitting: Family Medicine

## 2021-05-24 ENCOUNTER — Ambulatory Visit (INDEPENDENT_AMBULATORY_CARE_PROVIDER_SITE_OTHER): Payer: Medicare HMO

## 2021-05-24 VITALS — BP 155/83 | HR 95 | Ht 64.0 in | Wt 193.2 lb

## 2021-05-24 DIAGNOSIS — D126 Benign neoplasm of colon, unspecified: Secondary | ICD-10-CM

## 2021-05-24 DIAGNOSIS — E210 Primary hyperparathyroidism: Secondary | ICD-10-CM | POA: Diagnosis not present

## 2021-05-24 DIAGNOSIS — Z23 Encounter for immunization: Secondary | ICD-10-CM

## 2021-05-24 DIAGNOSIS — I10 Essential (primary) hypertension: Secondary | ICD-10-CM

## 2021-05-24 DIAGNOSIS — E038 Other specified hypothyroidism: Secondary | ICD-10-CM

## 2021-05-24 DIAGNOSIS — M25569 Pain in unspecified knee: Secondary | ICD-10-CM

## 2021-05-24 DIAGNOSIS — Z131 Encounter for screening for diabetes mellitus: Secondary | ICD-10-CM

## 2021-05-24 DIAGNOSIS — Z79899 Other long term (current) drug therapy: Secondary | ICD-10-CM | POA: Diagnosis not present

## 2021-05-24 DIAGNOSIS — K219 Gastro-esophageal reflux disease without esophagitis: Secondary | ICD-10-CM | POA: Diagnosis not present

## 2021-05-24 DIAGNOSIS — E7800 Pure hypercholesterolemia, unspecified: Secondary | ICD-10-CM | POA: Diagnosis not present

## 2021-05-24 DIAGNOSIS — E042 Nontoxic multinodular goiter: Secondary | ICD-10-CM

## 2021-05-24 DIAGNOSIS — Z9189 Other specified personal risk factors, not elsewhere classified: Secondary | ICD-10-CM

## 2021-05-24 DIAGNOSIS — Z1231 Encounter for screening mammogram for malignant neoplasm of breast: Secondary | ICD-10-CM

## 2021-05-24 LAB — POCT GLYCOSYLATED HEMOGLOBIN (HGB A1C): Hemoglobin A1C: 5.8 % — AB (ref 4.0–5.6)

## 2021-05-24 MED ORDER — CELECOXIB 200 MG PO CAPS: 200.0000 mg | ORAL_CAPSULE | Freq: Two times a day (BID) | ORAL | 3 refills | Status: DC

## 2021-05-24 MED ORDER — VALSARTAN 80 MG PO TABS: 80.0000 mg | ORAL_TABLET | Freq: Every day | ORAL | 3 refills | Status: DC

## 2021-05-24 MED ORDER — OMEPRAZOLE 20 MG PO CPDR: 1.0000 | DELAYED_RELEASE_CAPSULE | Freq: Every day | ORAL | 3 refills | Status: DC

## 2021-05-24 NOTE — Patient Instructions (Addendum)
Your blood pressure is up some, I would like you to start another blood pressure medication called Valsartan.  You should take one tablet daily at night.   Next week, call the [facility] lab and let them know you are coming in to have your potassium checked.  We have made a referral for you to have a colonoscopy to follow up your polyp you had in 2014.  We have made a referral for you to have your mammogram done to screen for breast cancer.   We are checking your liver, kidneys, sugar, cholesterol, and thyroid today.

## 2021-05-24 NOTE — Progress Notes (Signed)
Mariah Jackson is alone Sources of clinical information for visit is/are patient and past medical records. Nursing assessment for this office visit was reviewed with the patient for accuracy and revision.     Previous Report(s) Reviewed: lab reports and office notes  Depression screen [facility] 2/9 05/24/2021  Decreased Interest 0  Down, Depressed, Hopeless 0  PHQ - 2 Score 0  Altered sleeping 0  Tired, decreased energy 0  Change in appetite 0  Feeling bad or failure about yourself  0  Trouble concentrating 0  Moving slowly or fidgety/restless 0  Suicidal thoughts 0  PHQ-9 Score 0    Fall Risk  02/10/2020 03/04/2019 10/08/2018 02/05/2018 01/02/2017  Falls in the past year? 0 0 Yes Yes No  Number falls in past yr: - - 1 1 -  Injury with Fall? - - No No -  Risk for fall due to : - - Impaired balance/gait;Impaired mobility - -  Risk for fall due to: Comment - - - - -  Follow up - - Falls prevention discussed - -    PHQ9 SCORE ONLY 05/24/2021 02/10/2020 03/04/2019  PHQ-9 Total Score 0 0 0    Adult vaccines due  Topic Date Due  . TETANUS/TDAP  05/08/2026    Health Maintenance Due  Topic Date Due  . COLONOSCOPY (Pts 45-94yrs Insurance coverage will need to be confirmed)  05/07/2018  . MAMMOGRAM  02/01/2021      History/P.E. limitations: none  Adult vaccines due  Topic Date Due  . TETANUS/TDAP  05/08/2026   There are no preventive care reminders to display for this patient.  Health Maintenance Due  Topic Date Due  . COLONOSCOPY (Pts 45-39yrs Insurance coverage will need to be confirmed)  05/07/2018  . MAMMOGRAM  02/01/2021     Chief Complaint  Patient presents with  . Annual Exam  . Hypertension  . Medication Problem

## 2021-05-25 ENCOUNTER — Encounter: Payer: Self-pay | Admitting: Family Medicine

## 2021-05-25 DIAGNOSIS — Z9189 Other specified personal risk factors, not elsewhere classified: Secondary | ICD-10-CM | POA: Insufficient documentation

## 2021-05-25 LAB — CMP14+EGFR
ALT: 15 IU/L (ref 0–32)
AST: 14 IU/L (ref 0–40)
Albumin/Globulin Ratio: 1.7 (ref 1.2–2.2)
Albumin: 4.8 g/dL (ref 3.8–4.8)
Alkaline Phosphatase: 135 IU/L — ABNORMAL HIGH (ref 44–121)
BUN/Creatinine Ratio: 16 (ref 12–28)
BUN: 15 mg/dL (ref 8–27)
Bilirubin Total: 0.5 mg/dL (ref 0.0–1.2)
CO2: 22 mmol/L (ref 20–29)
Calcium: 11.1 mg/dL — ABNORMAL HIGH (ref 8.7–10.3)
Chloride: 102 mmol/L (ref 96–106)
Creatinine, Ser: 0.92 mg/dL (ref 0.57–1.00)
Globulin, Total: 2.8 g/dL (ref 1.5–4.5)
Glucose: 89 mg/dL (ref 65–99)
Potassium: 3.5 mmol/L (ref 3.5–5.2)
Sodium: 140 mmol/L (ref 134–144)
Total Protein: 7.6 g/dL (ref 6.0–8.5)
eGFR: 70 mL/min/{1.73_m2} (ref >59)

## 2021-05-25 LAB — LIPID PANEL
Chol/HDL Ratio: 2.3 ratio (ref 0.0–4.4)
Cholesterol, Total: 173 mg/dL (ref 100–199)
HDL: 74 mg/dL (ref >39)
LDL Chol Calc (NIH): 84 mg/dL (ref 0–99)
Triglycerides: 82 mg/dL (ref 0–149)
VLDL Cholesterol Cal: 15 mg/dL (ref 5–40)

## 2021-05-25 LAB — CBC
Hematocrit: 42.9 % (ref 34.0–46.6)
Hemoglobin: 14.7 g/dL (ref 11.1–15.9)
MCH: 26.5 pg — ABNORMAL LOW (ref 26.6–33.0)
MCHC: 34.3 g/dL (ref 31.5–35.7)
MCV: 77 fL — ABNORMAL LOW (ref 79–97)
Platelets: 249 10*3/uL (ref 150–450)
RBC: 5.54 x10E6/uL — ABNORMAL HIGH (ref 3.77–5.28)
RDW: 13.7 % (ref 11.7–15.4)
WBC: 4.3 10*3/uL (ref 3.4–10.8)

## 2021-05-25 LAB — TSH: TSH: 3.09 u[IU]/mL (ref 0.450–4.500)

## 2021-05-25 NOTE — Assessment & Plan Note (Signed)
Established problem Controlled Lab Results  Component Value Date   TSH 3.090 05/24/2021   Continue levothyroxine 100 microgram daily

## 2021-05-25 NOTE — Assessment & Plan Note (Signed)
Established problem Uncontrolled Start: Valsartan 50 mg daily,  RTC next week to check serum creatinine and potassium  Continue: Amlodipipine and Aldactazide

## 2021-05-25 NOTE — Assessment & Plan Note (Signed)
Established problem Well Controlled. No signs of complications, medication side effects, or red flags. Continue celocoxib  Continue HEP and stretching

## 2021-05-25 NOTE — Assessment & Plan Note (Signed)
Lipid Panel     Component Value Date/Time   CHOL 173 05/24/2021 1218   TRIG 82 05/24/2021 1218   HDL 74 05/24/2021 1218   CHOLHDL 2.3 05/24/2021 1218   CHOLHDL 2.2 08/29/2016 1227   VLDL 22 08/29/2016 1227   LDLCALC 84 05/24/2021 1218   LDLDIRECT 68 10/29/2018 1027   LABVLDL 15 05/24/2021 1218   Established problem Well Controlled. No signs of complications, medication side effects, or red flags. Continue current medications and other regiments.

## 2021-05-25 NOTE — Assessment & Plan Note (Signed)
Established problem Well Controlled. No signs of complications, medication side effects, or red flags. Continue current medications and other regiments.  

## 2021-05-25 NOTE — Assessment & Plan Note (Addendum)
Comprehensive Metabolic Panel:    Component Value Date/Time   NA 140 05/24/2021 1218   K 3.5 05/24/2021 1218   CL 102 05/24/2021 1218   CO2 22 05/24/2021 1218   BUN 15 05/24/2021 1218   CREATININE 0.92 05/24/2021 1218   CREATININE 0.82 08/29/2016 1227   GLUCOSE 89 05/24/2021 1218   GLUCOSE 91 08/29/2016 1227   CALCIUM 11.1 (H) 05/24/2021 1218   AST 14 05/24/2021 1218   ALT 15 05/24/2021 1218   ALKPHOS 135 (H) 05/24/2021 1218   BILITOT 0.5 05/24/2021 1218   PROT 7.6 05/24/2021 1218   ALBUMIN 4.8 05/24/2021 1218  Hypercalcemia goes back to 2008 Reloatively stable.  No bone, stones, abdominal groans.  serum creatinine is ok Monitor annually, more frequent if concerns of becoming symptomatic.   Needs DEXA to look for secondary osteoporosis Needs a vitamin D level

## 2021-05-25 NOTE — Assessment & Plan Note (Signed)
Established problem. Stable. No clinically significant size.  No tracheal deviation. Lungs BCTA  Periodic monitoring

## 2021-05-25 NOTE — Assessment & Plan Note (Signed)
Established problem. Stable. Checking Vit D level next week  Patient had DEXA in 2008 for elevated serum calcium  She needs repeat DEXA

## 2021-05-25 NOTE — Assessment & Plan Note (Signed)
Ms Jezek is delinquent in the follow up of Tubular adenoma found on screening colonoscopy by Dr Deatra Ina Vel[facility]) in 2014.  He requested patient follow up in 5 years.  CoViD pandemic interfered with the intended follow up.  Referral to [facility] for surveillance colonoscopy

## 2021-05-31 ENCOUNTER — Ambulatory Visit: Payer: Medicare HMO | Admitting: Neurology

## 2021-05-31 ENCOUNTER — Encounter: Payer: Self-pay | Admitting: Neurology

## 2021-05-31 VITALS — BP 129/75 | HR 93 | Ht 64.0 in | Wt 193.0 lb

## 2021-05-31 DIAGNOSIS — R269 Unspecified abnormalities of gait and mobility: Secondary | ICD-10-CM | POA: Diagnosis not present

## 2021-05-31 DIAGNOSIS — G35 Multiple sclerosis: Secondary | ICD-10-CM

## 2021-05-31 MED ORDER — DALFAMPRIDINE ER 10 MG PO TB12: 10.0000 mg | ORAL_TABLET | Freq: Two times a day (BID) | ORAL | 3 refills | Status: DC

## 2021-05-31 NOTE — Patient Instructions (Signed)
Continue current medications Check lab today  See you back in 1 year

## 2021-05-31 NOTE — Progress Notes (Signed)
[facility]  PATIENT: Mariah Jackson DOB: 1961/03/25  HISTORY OF PRESENT ILLNESS: HISTORY: [name], 60 year old female returns for followup for RRMS  She was diagnosed with multiple sclerosis in 2001, by Dr. Dellis Filbert at [facility]. The disease has primarily affected her balance and gait. She could only clarify one exacerbation, presenting as worsening balance difficulty, and says she has never been treated with intravenous steroids.   She was placed on Betaseron shortly after diagnosis, and has been on that drug ever since. Her gait difficulty had little changes over the  years. She ambulates with a cane, but is able to get around the house pretty well without it.   Most recent MRI was in January 2011,shows multiple periventricular, periatrial and subcortical white matter hyperintensities suggestive of demyelinating disease.No enhancing lesions or significant atrophy is seen. compared to MRI films from Aug 2000 there appear to be more T2 lesions in right frontal subcortical region.   She is tired of taking the shots and does have multiple areas of lipoatrophy, especially on the abdominal area. She is ambulating with a cane. She  switched to Tecfidera since Oct 2014, doing well.  UPDATE Sep 18th 2015: She denies signficant side effect from Tecfidera. She has been on Ampyra 10mg  bid for few years, which definitely has helped her walking  MRI brain March 2014: Multiple periventricular and subcortical chronic demyelinating disease. No acute plaques are seen. MRI cervical spine: Disc bulging at C5-6. Spondylosis at C5-6 and C6-7. C5-6 there is focal disc protrusion slightly deforms anterior spinal cord, with spinal stenosis or foraminal stenosis. No intrinsic or enhancing spinal cord lesions.  UPDATE March 17th 2016: She fell in Feb 2016, now with right shoulder and neck pain, overall has improved, she is continue taking Tecfidera, Ampyra, denies significant side effect,  ambulate with spastic gait, wear her right ankle brace.  UPDATE July 16 2017: She is overall doing well, no flareup, continue have gait abnormality, complains of bilateral knee pain, has valgrus, reviewed laboratory evaluations, normal CMP, elevated alkaline phosphate 151, mild elevated glucose 101, CBC showed low MCV 75, normal TSH  Update March 16, 2018:  She is overall doing very well, no significant change, continue taking Ampyra, using of one-point cane, unsteady gait,  I have personally reviewed MRI of brain with and without contrast in August 2018, multiple T2/FLAIR hyperintensity lesions at both hemisphere, no contrast enhancement, there was no infratentorial lesions, no significant change compared to previous scans.  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  I personally reviewed most recent MRI of brain with without contrast in August 2018, multiple T2/flair hyperintensity foci in the white matter of both hemispheres, with large confluences in the periatrial white matter bilaterally,  Laboratory evaluations in 2020 showed normal TSH, elevated calcium 11, normal creatinine 0.94, normal PTH 63  UPDATE March 14 2020: Her brother drove her to clinic today, she is doing well, ambulate with a cane or walker for longer distance, denies bowel and bladder incontinence, complains of occasional bilateral calf  muscle tightness, especially during morning times, she tolerating Tecfidera twice a day well, also  taking 10 mg twice a day  She denies significant worsening of functional status, actually works better  Update May 31, 2021 SS: Feels MS is stable, got a rollator, chronic right ankle issues, flared up today. No recent falls. She actually thinks she is better, is walking more, can use her cane, goes out and shops. Brother lives with her drives with her. 2 little dogs, Bella, Honey.   MRI brain ordered last visit, not completed, she got scared, doesn't feel necessary. Remains on Tecfidera, Ampyra.  Reviewed recent labs  05/24/21, CMP showed calcium 11.1, normal liver function.  A1c 5.8, TSH 3.090, CBC WBC 4.3, no differential was done. No longer taking gabapentin. Does home exercises arm, leg presses/squeezes, walks on her ramp. Celebrex helps with stiffness. Please how well she is doing.   REVIEW OF SYSTEMS:   See HPI   ALLERGIES: Allergies  Allergen Reactions  . Vicodin [Hydrocodone-Acetaminophen] Nausea Only    HOME MEDICATIONS: Outpatient Medications Prior to Visit  Medication Sig Dispense Refill  . amLODipine (NORVASC) 10 MG tablet Take 1 tablet (10 mg total) by mouth daily. 90 tablet 3  . celecoxib (CELEBREX) 200 MG capsule Take 1 capsule (200 mg total) by mouth 2 (two) times daily. 180 capsule 3  . levothyroxine (SYNTHROID) 100 MCG tablet Take 1 tablet (100 mcg total) by mouth daily. 90 tablet 3  . Multiple Vitamin (MULTIVITAMIN) tablet Take 1 tablet by mouth daily.    Marland Kitchen omeprazole (PRILOSEC) 20 MG capsule Take 1 capsule (20 mg total) by mouth daily. 90 capsule 3  . rosuvastatin (CRESTOR) 10 MG tablet Take 1 tablet (10 mg total) by mouth daily. 90 tablet 3  . spironolactone-hydrochlorothiazide (ALDACTAZIDE) 25-25 MG tablet Take 1 tablet by mouth daily. 90 tablet 3  . TECFIDERA 240 MG CPDR TAKE ONE CAPSULE BY MOUTH TWICE DAILY. STORE IN ORIGINAL CONTAINER AT ROOM TEMPERATURE. 180 capsule 3  . valsartan (DIOVAN) 80 MG tablet Take 1 tablet (80 mg total) by mouth at bedtime. 90 tablet  3  . dalfampridine 10 MG TB12 Take 1 tablet (10 mg total) by mouth every 12 (twelve) hours. 180 tablet 3  . famotidine (PEPCID) 40 MG tablet Take 1 tablet (40 mg total) by mouth daily. 90 tablet 3  . gabapentin (NEURONTIN) 100 MG capsule Take 1 capsule (100 mg total) by mouth 3 (three) times daily. 270 capsule 3   No facility-administered medications prior to visit.    PAST MEDICAL HISTORY: Past Medical History:  Diagnosis Date  . Abnormality of gait 09/10/2013  . Arthritis   . 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 concordant by Dr. Kristopher Oppenheim, with excision recommended.  Surgical consultation has been arranged with Dr. Erroll Luna at [facility] on March 19, 2019.  Mammogram 01/2019: medial right breast  . Breast cancer ([place]) 2007   left  . 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 - SEE ONCOLOGY TABLE.  COMMENT ONCOLOGY TABLE-BREAST, IN SITU CARCINOMA ONLY  . Essential hypertension 11/09/2014  . GERD (gastroesophageal  reflux disease)   . History of breast cancer 12/30/2005  . History of breast lump/mass excision 11/09/2014   Lumpectomy 2007. Pathology report non-cancerous, fibrous tissue.    Marland Kitchen History of ductal carcinoma in situ (DCIS) of breast 03/05/2019   Excision Biopsy 2017: DUCTAL CARCINOMA IN SITU, INTERMEDIATE GRADE WITH FOCAL NECROSIS, 2 CM. Estrogen receptor positive Progesterone receptor positive 8. TNM: pTispNXpMX  . History of ductal carcinoma in situ (DCIS) of breast 03/05/2019   Excision Biopsy 2017: DUCTAL CARCINOMA IN SITU,  INTERMEDIATE GRADE WITH FOCAL NECROSIS, 2 CM. Estrogen receptor positive Progesterone receptor positive 8. TNM: pTispNXpMX  . Hyperlipidemia   . Hypertension   . Hypothyroidism   . Multinodular goiter (nontoxic) 08/29/2016  . Multiple sclerosis ([place])   . Multiple sclerosis, relapsing-remitting ([place]) 09/10/2013  . Personal history of radiation therapy   . Pure hypercholesterolemia 11/09/2014  . Thyroid enlargement 11/09/2014   Right sided thyroid enlargement      PAST SURGICAL HISTORY: Past Surgical History:  Procedure Laterality Date  . BREAST LUMPECTOMY  2007   left, radiation  . MM BREAST STEREO BX*L*R/S Right 03/12/2019   RIGHT BREAST STEREOTACTIC CORE NEEDLE BIOPSY  . TUBAL LIGATION  1985    FAMILY HISTORY: Family History  Problem Relation Age of Onset  . Stroke Father   . Hypertension Mother   . Pneumonia Mother   . Hypertension Sister   . Hypertension Brother   . Diabetes Brother 5       Prediabetic  . Hypertension Brother   . Hypertension Brother   . Hypertension Brother   . Hypertension Brother   . Hypertension Sister   . Colon cancer Neg Hx   . Breast cancer Neg Hx     SOCIAL HISTORY: Social History   Socioeconomic History  . Marital status: Divorced    Spouse name: Not on file  . Number of children: 2  . Years of education: 80  . Highest education level: 12th grade  Occupational History  . Occupation: Unemployed   . Occupation: Disabiliy  Tobacco Use  . Smoking status: Never Smoker  . Smokeless tobacco: Never Used  . Tobacco comment: no plans to start  Vaping Use  . Vaping Use: Never used  Substance and Sexual Activity  . Alcohol use: No  . Drug use: No  . Sexual activity: Not Currently  Other Topics Concern  . Not on file  Social History Narrative   Patient a high school education.    Patient is unemployed.   Patient has 2 children.  Patient lives in her home with brother and son. One story home, ramp at back, 3-4 steps in front. Has  grab bar at tub but not toilet. Pets: 2 dogs, Miss Honey and Miss [place].   Smoke alarms in home. Carpet in home, bath mats in bathroom.  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    Patient is divorced.    Eats small meals during day. Meat, veggies, fruit. Has trouble staying asleep. Wakes up about 4 am after going to bed at 1030 pm.   Mobility:  Cane or walker   Transportation: [facility] or family member   Caregiver: daughter    Emergency Contact: daughter, [name], CNA   Advance Directive:  None   Resources:   -Financial payment plan with the neurology office.   -[place] program participate for MS which covers some medical expense for patient.  DME: cane, walker, BP monitor (digital), partial plate (upper), eyeglasses, ankle brace   Social Determinants of Health   Financial Resource Strain: Not on file  Food Insecurity: Not on file  Transportation Needs: Not on file  Physical Activity: Not on file  Stress: Not on file  Social Connections: Not on file  Intimate Partner Violence: Not on file   PHYSICAL EXAM  Vitals:   05/31/21 1025  BP: 129/75  Pulse: 93  Weight: 193 lb (87.5 kg)  Height: 5\' 4"  (1.626 m)   Body mass index is 33.13 kg/m.  Physical Exam  General: The patient is alert and cooperative at the time of the examination. Very pleasant, interactive  Skin: No significant peripheral edema is noted.  Neurologic Exam  Mental status: The patient is alert and oriented x 3 at the time of the examination. The patient has apparent normal recent and remote memory, with an apparently normal attention span and concentration ability.   Cranial nerves: Facial symmetry is present. Speech is normal, no aphasia or dysarthria is noted. Extraocular movements are full. Visual fields are full.  Motor: The patient has good strength in all 4 extremities. Right ankle wrapped with brace.  Sensory  examination: Soft touch sensation is symmetric on the face, arms, and legs.  Coordination: The patient has good finger-nose-finger and heel-to-shin bilaterally.  Gait and station: Relies on a walker to stand, right external rotation of foot, flat footed gait, cautious, stiff  Reflexes: Deep tendon reflexes are symmetric, but slightly increased at the knees  DIAGNOSTIC DATA (LABS, IMAGING, TESTING) - I reviewed patient records, labs, notes, testing and imaging myself where available.      Component Value Date/Time   NA 140 05/24/2021 1218   K 3.5 05/24/2021 1218   CL 102 05/24/2021 1218   CO2 22 05/24/2021 1218   GLUCOSE 89 05/24/2021 1218   GLUCOSE 91 08/29/2016 1227   BUN 15 05/24/2021 1218   CREATININE 0.92 05/24/2021 1218   CREATININE 0.82 08/29/2016 1227   CALCIUM 11.1 (H) 05/24/2021 1218   PROT 7.6 05/24/2021 1218   ALBUMIN 4.8 05/24/2021 1218   AST 14 05/24/2021 1218   ALT 15 05/24/2021 1218   ALKPHOS 135 (H) 05/24/2021 1218   BILITOT 0.5 05/24/2021 1218   GFRNONAA 75 04/05/2020 1010   GFRNONAA 80 08/29/2016 1227   GFRAA 87 04/05/2020 1010   GFRAA >89 08/29/2016 1227    ASSESSMENT AND PLAN  1. Relapsing remitting multiple sclerosis 2.  Gait abnormality  -Doing well, remains overall stable, really pleased how well she is doing -Fearful of MRI of the brain, didn't have it done in 2021, will revisit at next follow-up appointment, may agree to open MRI -Continue on Tecfidera, tolerating well -Continue Ampyra, functional status has remained stable, does exercises, uses cane, goes out on errands -Check CBC with differential, CMP reviewed -Last MRI of the brain with and without contrast in August 2018 showed evidence of supratentorium MS lesions, no contrast-enhancement, no change compared to prior MRIs -Encouraged to continue exercise, activity -Follow-up in 1 year or sooner if needed  I spent 30 minutes of face-to-face and non-face-to-face time with patient.  This  included previsit chart review, lab review, study review, reviewing medications, discussing reason for routine MRI surveillance, discussing management plan.  Evangeline Dakin, DNP  [facility] [address] [place], [place] [address] [phone]

## 2021-06-01 LAB — CBC WITH DIFFERENTIAL/PLATELET
Basophils Absolute: 0 10*3/uL (ref 0.0–0.2)
Basos: 0 %
EOS (ABSOLUTE): 0 10*3/uL (ref 0.0–0.4)
Eos: 0 %
Hematocrit: 42.4 % (ref 34.0–46.6)
Hemoglobin: 14.1 g/dL (ref 11.1–15.9)
Immature Grans (Abs): 0 10*3/uL (ref 0.0–0.1)
Immature Granulocytes: 0 %
Lymphocytes Absolute: 0.9 10*3/uL (ref 0.7–3.1)
Lymphs: 22 %
MCH: 26.2 pg — ABNORMAL LOW (ref 26.6–33.0)
MCHC: 33.3 g/dL (ref 31.5–35.7)
MCV: 79 fL (ref 79–97)
Monocytes Absolute: 0.3 10*3/uL (ref 0.1–0.9)
Monocytes: 8 %
Neutrophils Absolute: 2.8 10*3/uL (ref 1.4–7.0)
Neutrophils: 70 %
Platelets: 272 10*3/uL (ref 150–450)
RBC: 5.38 x10E6/uL — ABNORMAL HIGH (ref 3.77–5.28)
RDW: 13.8 % (ref 11.7–15.4)
WBC: 4 10*3/uL (ref 3.4–10.8)

## 2021-06-05 ENCOUNTER — Telehealth: Payer: Self-pay

## 2021-06-05 NOTE — Telephone Encounter (Signed)
Pt verified by name and DOB,  normal results given per provider, pt voiced understanding all question answered. °

## 2021-06-05 NOTE — Telephone Encounter (Signed)
-----   Message from Suzzanne Cloud, NP sent at 06/04/2021  5:53 AM EDT ----- CBC shows no significant abnormalities, absolute lymphocyte count is 0.9 on Tecfidera.

## 2021-06-12 ENCOUNTER — Other Ambulatory Visit: Payer: Self-pay | Admitting: Family Medicine

## 2021-06-12 DIAGNOSIS — M25569 Pain in unspecified knee: Secondary | ICD-10-CM

## 2021-07-25 ENCOUNTER — Ambulatory Visit: Payer: Medicare HMO

## 2021-09-12 ENCOUNTER — Ambulatory Visit
Admission: RE | Admit: 2021-09-12 | Discharge: 2021-09-12 | Disposition: A | Discharge status: Home / Self Care | Payer: Medicare HMO | Source: Ambulatory Visit | Attending: Family Medicine | Admitting: Family Medicine

## 2021-09-12 ENCOUNTER — Other Ambulatory Visit: Payer: Self-pay

## 2021-09-12 ENCOUNTER — Other Ambulatory Visit: Payer: Self-pay | Admitting: Family Medicine

## 2021-09-12 DIAGNOSIS — N6489 Other specified disorders of breast: Secondary | ICD-10-CM

## 2021-09-12 DIAGNOSIS — Z1231 Encounter for screening mammogram for malignant neoplasm of breast: Secondary | ICD-10-CM

## 2021-09-12 NOTE — Progress Notes (Signed)
Chart reviewed, agree above plan ?

## 2021-10-05 ENCOUNTER — Other Ambulatory Visit: Payer: Self-pay

## 2021-10-05 ENCOUNTER — Ambulatory Visit: Payer: Medicare HMO

## 2021-10-05 ENCOUNTER — Ambulatory Visit
Admission: RE | Admit: 2021-10-05 | Discharge: 2021-10-05 | Disposition: A | Discharge status: Home / Self Care | Payer: Medicare HMO | Source: Ambulatory Visit | Attending: Family Medicine | Admitting: Family Medicine

## 2021-10-05 ENCOUNTER — Encounter: Payer: Self-pay | Admitting: Family Medicine

## 2021-10-05 DIAGNOSIS — N6489 Other specified disorders of breast: Secondary | ICD-10-CM

## 2021-10-05 DIAGNOSIS — Z853 Personal history of malignant neoplasm of breast: Secondary | ICD-10-CM | POA: Diagnosis not present

## 2021-10-05 DIAGNOSIS — R922 Inconclusive mammogram: Secondary | ICD-10-CM | POA: Diagnosis not present

## 2021-11-07 ENCOUNTER — Telehealth: Payer: Self-pay

## 2021-11-07 NOTE — Telephone Encounter (Signed)
(  Key: [id number]) Tecfidera 240 mg dr capsules   Form [employer] Electronic PA Form [phone] NCPDP)  Waiting for Determination

## 2021-11-08 NOTE — Telephone Encounter (Signed)
Message from Plan Your request has been approved  Faxed to pharmacy

## 2021-11-16 ENCOUNTER — Other Ambulatory Visit: Payer: Self-pay | Admitting: Family Medicine

## 2021-11-16 DIAGNOSIS — E038 Other specified hypothyroidism: Secondary | ICD-10-CM

## 2021-11-16 DIAGNOSIS — I10 Essential (primary) hypertension: Secondary | ICD-10-CM

## 2022-03-11 DIAGNOSIS — K08409 Partial loss of teeth, unspecified cause, unspecified class: Secondary | ICD-10-CM | POA: Diagnosis not present

## 2022-03-11 DIAGNOSIS — K219 Gastro-esophageal reflux disease without esophagitis: Secondary | ICD-10-CM | POA: Diagnosis not present

## 2022-03-11 DIAGNOSIS — G8929 Other chronic pain: Secondary | ICD-10-CM | POA: Diagnosis not present

## 2022-03-11 DIAGNOSIS — E669 Obesity, unspecified: Secondary | ICD-10-CM | POA: Diagnosis not present

## 2022-03-11 DIAGNOSIS — G35 Multiple sclerosis: Secondary | ICD-10-CM | POA: Diagnosis not present

## 2022-03-11 DIAGNOSIS — E785 Hyperlipidemia, unspecified: Secondary | ICD-10-CM | POA: Diagnosis not present

## 2022-03-11 DIAGNOSIS — G629 Polyneuropathy, unspecified: Secondary | ICD-10-CM | POA: Diagnosis not present

## 2022-03-11 DIAGNOSIS — Z6830 Body mass index (BMI) 30.0-30.9, adult: Secondary | ICD-10-CM | POA: Diagnosis not present

## 2022-03-11 DIAGNOSIS — I10 Essential (primary) hypertension: Secondary | ICD-10-CM | POA: Diagnosis not present

## 2022-03-11 DIAGNOSIS — M1990 Unspecified osteoarthritis, unspecified site: Secondary | ICD-10-CM | POA: Diagnosis not present

## 2022-03-11 DIAGNOSIS — R609 Edema, unspecified: Secondary | ICD-10-CM | POA: Diagnosis not present

## 2022-03-11 DIAGNOSIS — E039 Hypothyroidism, unspecified: Secondary | ICD-10-CM | POA: Diagnosis not present

## 2022-03-13 ENCOUNTER — Other Ambulatory Visit: Payer: Self-pay | Admitting: Neurology

## 2022-05-08 ENCOUNTER — Other Ambulatory Visit: Payer: Self-pay

## 2022-05-08 DIAGNOSIS — I10 Essential (primary) hypertension: Secondary | ICD-10-CM

## 2022-05-08 MED ORDER — VALSARTAN 80 MG PO TABS: 80.0000 mg | ORAL_TABLET | Freq: Every day | ORAL | 0 refills | Status: DC

## 2022-05-10 MED ORDER — VALSARTAN 80 MG PO TABS: 80.0000 mg | ORAL_TABLET | Freq: Every day | ORAL | 0 refills | Status: DC

## 2022-05-10 NOTE — Addendum Note (Signed)
Addended by: Valerie Roys on: 05/10/2022 09:58 AM ? ? Modules accepted: Orders ? ?

## 2022-05-10 NOTE — Telephone Encounter (Signed)
Resent medication to correct mail order pharmacy.  Mariah Jackson,CMA ? ?

## 2022-05-13 ENCOUNTER — Other Ambulatory Visit: Payer: Self-pay

## 2022-05-13 DIAGNOSIS — I10 Essential (primary) hypertension: Secondary | ICD-10-CM

## 2022-05-13 MED ORDER — AMLODIPINE BESYLATE 10 MG PO TABS: 10.0000 mg | ORAL_TABLET | Freq: Every day | ORAL | 3 refills | Status: DC

## 2022-05-17 ENCOUNTER — Other Ambulatory Visit: Payer: Self-pay | Admitting: Family Medicine

## 2022-05-17 DIAGNOSIS — M25569 Pain in unspecified knee: Secondary | ICD-10-CM

## 2022-05-20 ENCOUNTER — Other Ambulatory Visit: Payer: Self-pay | Admitting: Neurology

## 2022-05-20 DIAGNOSIS — G35 Multiple sclerosis: Secondary | ICD-10-CM

## 2022-05-22 DIAGNOSIS — Z0100 Encounter for examination of eyes and vision without abnormal findings: Secondary | ICD-10-CM | POA: Diagnosis not present

## 2022-05-30 ENCOUNTER — Ambulatory Visit (INDEPENDENT_AMBULATORY_CARE_PROVIDER_SITE_OTHER): Payer: Medicare HMO | Admitting: Family Medicine

## 2022-05-30 ENCOUNTER — Encounter: Payer: Self-pay | Admitting: Family Medicine

## 2022-05-30 VITALS — BP 135/70 | HR 100 | Ht 64.0 in | Wt 195.2 lb

## 2022-05-30 DIAGNOSIS — I10 Essential (primary) hypertension: Secondary | ICD-10-CM

## 2022-05-30 DIAGNOSIS — E7800 Pure hypercholesterolemia, unspecified: Secondary | ICD-10-CM | POA: Diagnosis not present

## 2022-05-30 DIAGNOSIS — N6091 Unspecified benign mammary dysplasia of right breast: Secondary | ICD-10-CM

## 2022-05-30 DIAGNOSIS — Z9189 Other specified personal risk factors, not elsewhere classified: Secondary | ICD-10-CM | POA: Diagnosis not present

## 2022-05-30 DIAGNOSIS — E038 Other specified hypothyroidism: Secondary | ICD-10-CM | POA: Diagnosis not present

## 2022-05-30 DIAGNOSIS — E042 Nontoxic multinodular goiter: Secondary | ICD-10-CM | POA: Diagnosis not present

## 2022-05-30 DIAGNOSIS — D126 Benign neoplasm of colon, unspecified: Secondary | ICD-10-CM

## 2022-05-30 DIAGNOSIS — Z79899 Other long term (current) drug therapy: Secondary | ICD-10-CM

## 2022-05-30 DIAGNOSIS — E210 Primary hyperparathyroidism: Secondary | ICD-10-CM | POA: Diagnosis not present

## 2022-05-30 LAB — POCT GLYCOSYLATED HEMOGLOBIN (HGB A1C): Hemoglobin A1C: 5.7 % — AB (ref 4.0–5.6)

## 2022-05-30 NOTE — Patient Instructions (Signed)
Your blood pressure looks good.  Keep taking your blood pressure medications  We are checking your calcium in your blood to make sure it does not get tooo high.   We are checking your thyroid hormone level today to make sure you are taking enough Levothyroxine medicine to replace what your thyroid gland cannot make on its own.    We have made a referral to [facility] to have your bone strength tested to make sure your bones are not getting weak and could break easy.  A referral to [facility] to have them make sure you do not have any precancers in your colon that could turn into cancers.  If there are precancers in your colon, they will take them out so they can become cancers.   Prediabetes Eating Plan Prediabetes is a condition that causes blood sugar (glucose) levels to be higher than normal. This increases the risk for developing type 2 diabetes (type 2 diabetes mellitus). Working with a health care provider or nutrition specialist (dietitian) to make diet and lifestyle changes can help prevent the onset of diabetes. These changes may help you: Control your blood glucose levels. Improve your cholesterol levels. Manage your blood pressure. What are tips for following this plan? Reading food labels Read food labels to check the amount of fat, salt (sodium), and sugar in prepackaged foods. Avoid foods that have: Saturated fats. Trans fats. Added sugars. Avoid foods that have more than 300 milligrams (mg) of sodium per serving. Limit your sodium intake to less than 2,300 mg each day. Shopping Avoid buying pre-made and processed foods. Avoid buying drinks with added sugar. Cooking Cook with olive oil. Do not use butter, lard, or ghee. Bake, broil, grill, steam, or boil foods. Avoid frying. Meal planning  Work with your dietitian to create an eating plan that is right for you. This may include tracking how many calories you take in each day. Use a food diary,  notebook, or mobile application to track what you eat at each meal. Consider following a Mediterranean diet. This includes: Eating several servings of fresh fruits and vegetables each day. Eating fish at least twice a week. Eating one serving each day of whole grains, beans, nuts, and seeds. Using olive oil instead of other fats. Limiting alcohol. Limiting red meat. Using nonfat or low-fat dairy products. Consider following a plant-based diet. This includes dietary choices that focus on eating mostly vegetables and fruit, grains, beans, nuts, and seeds. If you have high blood pressure, you may need to limit your sodium intake or follow a diet such as the DASH (Dietary Approaches to Stop Hypertension) eating plan. The DASH diet aims to lower high blood pressure. Lifestyle Set weight loss goals with help from your health care team. It is recommended that most people with prediabetes lose 7% of their body weight. Exercise for at least 30 minutes 5 or more days a week. Attend a support group or seek support from a mental health counselor. Take over-the-counter and prescription medicines only as told by your health care provider. What foods are recommended? Fruits Berries. Bananas. Apples. Oranges. Grapes. Papaya. Mango. Pomegranate. Kiwi. Grapefruit. Cherries. Vegetables Lettuce. Spinach. Peas. Beets. Cauliflower. Cabbage. Broccoli. Carrots. Tomatoes. Squash. Eggplant. Herbs. Peppers. Onions. Cucumbers. Brussels sprouts. Grains Whole grains, such as whole-wheat or whole-grain breads, crackers, cereals, and pasta. Unsweetened oatmeal. Bulgur. Barley. Quinoa. Brown rice. Corn or whole-wheat flour tortillas or taco shells. Meats and other proteins Seafood. Poultry without skin. Lean cuts of pork and  beef. Tofu. Eggs. Nuts. Beans. Dairy Low-fat or fat-free dairy products, such as yogurt, cottage cheese, and cheese. Beverages Water. Tea. Coffee. Sugar-free or diet soda. Seltzer water. Low-fat or  nonfat milk. Milk alternatives, such as soy or almond milk. Fats and oils Olive oil. Canola oil. Sunflower oil. Grapeseed oil. Avocado. Walnuts. Sweets and desserts Sugar-free or low-fat pudding. Sugar-free or low-fat ice cream and other frozen treats. Seasonings and condiments Herbs. Sodium-free spices. Mustard. Relish. Low-salt, low-sugar ketchup. Low-salt, low-sugar barbecue sauce. Low-fat or fat-free mayonnaise. The items listed above may not be a complete list of recommended foods and beverages. Contact a dietitian for more information. What foods are not recommended? Fruits Fruits canned with syrup. Vegetables Canned vegetables. Frozen vegetables with butter or cream sauce. Grains Refined white flour and flour products, such as bread, pasta, snack foods, and cereals. Meats and other proteins Fatty cuts of meat. Poultry with skin. Breaded or fried meat. Processed meats. Dairy Full-fat yogurt, cheese, or milk. Beverages Sweetened drinks, such as iced tea and soda. Fats and oils Butter. Lard. Ghee. Sweets and desserts Baked goods, such as cake, cupcakes, pastries, cookies, and cheesecake. Seasonings and condiments Spice mixes with added salt. Ketchup. Barbecue sauce. Mayonnaise. The items listed above may not be a complete list of foods and beverages that are not recommended. Contact a dietitian for more information. Where to find more information American Diabetes Association: www.diabetes.org Summary You may need to make diet and lifestyle changes to help prevent the onset of diabetes. These changes can help you control blood sugar, improve cholesterol levels, and manage blood pressure. Set weight loss goals with help from your health care team. It is recommended that most people with prediabetes lose 7% of their body weight. Consider following a Mediterranean diet. This includes eating plenty of fresh fruits and vegetables, whole grains, beans, nuts, seeds, fish, and low-fat  dairy, and using olive oil instead of other fats. This information is not intended to replace advice given to you by your health care provider. Make sure you discuss any questions you have with your health care provider. Document Revised: 03/16/2020 Document Reviewed: 03/16/2020 Elsevier Patient Education  [employer].

## 2022-05-31 ENCOUNTER — Encounter: Payer: Self-pay | Admitting: Family Medicine

## 2022-05-31 LAB — BASIC METABOLIC PANEL
BUN/Creatinine Ratio: 13 (ref 12–28)
BUN: 12 mg/dL (ref 8–27)
CO2: 23 mmol/L (ref 20–29)
Calcium: 10.7 mg/dL — ABNORMAL HIGH (ref 8.7–10.3)
Chloride: 101 mmol/L (ref 96–106)
Creatinine, Ser: 0.93 mg/dL (ref 0.57–1.00)
Glucose: 129 mg/dL — ABNORMAL HIGH (ref 70–99)
Potassium: 4 mmol/L (ref 3.5–5.2)
Sodium: 141 mmol/L (ref 134–144)
eGFR: 69 mL/min/{1.73_m2} (ref >59)

## 2022-05-31 LAB — VITAMIN D 25 HYDROXY (VIT D DEFICIENCY, FRACTURES): Vit D, 25-Hydroxy: 32.2 ng/mL (ref 30.0–100.0)

## 2022-05-31 LAB — TSH: TSH: 2.17 u[IU]/mL (ref 0.450–4.500)

## 2022-05-31 NOTE — Assessment & Plan Note (Signed)
Established problem Lab Results  Component Value Date   HGBA1C 5.7 (A) 05/30/2022   Stable A1c No signs of complications, medication side effects, or red flags. Discussed role of weight loss and activity in slowing or preventing

## 2022-05-31 NOTE — Assessment & Plan Note (Signed)
Vitals:   05/30/22 0843 05/30/22 0953  BP: (!) 141/74 135/70  Established problem Well Controlled and is at goal of <140/90. No signs of complications, medication side effects, or red flags. Continue current medications and other regiments.

## 2022-05-31 NOTE — Assessment & Plan Note (Signed)
Established problem. No bodily pains, no abdominal pains, no flank pain/hematuria    Latest Ref Rng & Units 05/30/2022   11:21 AM 05/24/2021   12:18 PM 04/05/2020   10:10 AM  BMP  Glucose 70 - 99 mg/dL 129   89     BUN 8 - 27 mg/dL 12   15     Creatinine 0.57 - 1.00 mg/dL 0.93   0.92   0.84    BUN/Creat Ratio 12 - 28 13   16     Sodium 134 - 144 mmol/L 141   140     Potassium 3.5 - 5.2 mmol/L 4.0   3.5     Chloride 96 - 106 mmol/L 101   102     CO2 20 - 29 mmol/L 23   22     Calcium 8.7 - 10.3 mg/dL 10.7   11.1   10.5     Mild elevation in serum calcium.  No symptoms  A/ Primary Hyperparathyroidism - asymptomatic.       P/ Annual monitoring.  Check iPTH with Ca++ next visit.   

## 2022-05-31 NOTE — Assessment & Plan Note (Signed)
Established problem Lab Results  Component Value Date   TSH 2.170 05/30/2022  Controlled.  Continue levothyroxine 100 microgram daily Recheck annually

## 2022-05-31 NOTE — Assessment & Plan Note (Signed)
Established problem Lab Results  Component Value Date   LDLCALC 84 05/24/2021  Controlled.  At goal LDL < 100 Primary prevention Continue moderate dose rosuvastin

## 2022-05-31 NOTE — Progress Notes (Signed)
Mariah Jackson is alone Sources of clinical information for visit is/are patient. Nursing assessment for this office visit was reviewed with the patient for accuracy and revision.     Previous Report(s) Reviewed: none     05/30/2022    8:42 AM  Depression screen PHQ 2/9  Decreased Interest 0  Down, Depressed, Hopeless 0  PHQ - 2 Score 0  Altered sleeping 0  Tired, decreased energy 0  Change in appetite 0  Feeling bad or failure about yourself  0  Trouble concentrating 0  Moving slowly or fidgety/restless 0  Suicidal thoughts 0  PHQ-9 Score 0   Flowsheet Row Office Visit from 05/30/2022 in [place] Office Visit from 05/24/2021 in [place] Office Visit from 10/16/2018 in [place] for [facility]  Thoughts that you would be better off dead, or of hurting yourself in some way Not at all Not at all Not at all  PHQ-9 Total Score 0 0 4          02/10/2020    9:19 AM 03/04/2019   10:09 AM 10/08/2018   10:21 AM 02/05/2018    8:27 AM 01/02/2017    2:17 PM  Fall Risk   Falls in the past year? 0 0 Yes Yes No  Number falls in past yr:   1 1   Injury with Fall?   No No   Risk for fall due to :   Impaired balance/gait;Impaired mobility    Follow up   Falls prevention discussed         05/30/2022    8:42 AM 05/24/2021   10:10 AM 02/10/2020    9:20 AM  PHQ9 SCORE ONLY  PHQ-9 Total Score 0 0 0    Adult vaccines due  Topic Date Due   TETANUS/TDAP  05/08/2026    Health Maintenance Due  Topic Date Due   Zoster Vaccines- Shingrix (1 of 2) Never done   COLONOSCOPY (Pts 45-3yr Insurance coverage will need to be confirmed)  05/07/2018   COVID-19 Vaccine (4 - Booster for Pfizer series) 07/19/2021   PAP SMEAR-Modifier  10/16/2021      History/P.E. limitations: none  Adult vaccines due  Topic Date Due   TETANUS/TDAP  05/08/2026   There are no preventive care reminders to display for this patient.  Health Maintenance Due   Topic Date Due   Zoster Vaccines- Shingrix (1 of 2) Never done   COLONOSCOPY (Pts 45-457yrInsurance coverage will need to be confirmed)  05/07/2018   COVID-19 Vaccine (4 - Booster for Pfizer series) 07/19/2021   PAP SMEAR-Modifier  10/16/2021     Chief Complaint  Patient presents with   Medication Management

## 2022-05-31 NOTE — Assessment & Plan Note (Signed)
Established problem. Stable.  No signs of complications, medication side effects, or red flags. Continue monitoring

## 2022-05-31 NOTE — Assessment & Plan Note (Signed)
Established problem [facility] surveillance.  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 Referral back to [place] for consideration of surveillance colonoscopy

## 2022-05-31 NOTE — Assessment & Plan Note (Signed)
Established problem Mariah Jackson insists she is being followed up a general surgery for the ADH annually, though I am unable to find a record of [facility] consultations. She declined referral to [facility], saying it was not necessary.

## 2022-06-05 ENCOUNTER — Other Ambulatory Visit: Payer: Self-pay | Admitting: Family Medicine

## 2022-06-05 DIAGNOSIS — K219 Gastro-esophageal reflux disease without esophagitis: Secondary | ICD-10-CM

## 2022-06-06 ENCOUNTER — Ambulatory Visit: Payer: Medicare HMO | Admitting: Neurology

## 2022-06-06 ENCOUNTER — Encounter: Payer: Self-pay | Admitting: Neurology

## 2022-06-06 VITALS — BP 128/77 | HR 97 | Ht 64.0 in | Wt 194.0 lb

## 2022-06-06 DIAGNOSIS — R269 Unspecified abnormalities of gait and mobility: Secondary | ICD-10-CM

## 2022-06-06 DIAGNOSIS — G35 Multiple sclerosis: Secondary | ICD-10-CM

## 2022-06-06 MED ORDER — DALFAMPRIDINE ER 10 MG PO TB12: 10.0000 mg | ORAL_TABLET | Freq: Two times a day (BID) | ORAL | 5 refills | Status: DC

## 2022-06-06 MED ORDER — TECFIDERA 240 MG PO CPDR: DELAYED_RELEASE_CAPSULE | ORAL | 3 refills | Status: DC

## 2022-06-06 NOTE — Patient Instructions (Signed)
Check labs today  Continue current medications Ordered rolling walker, go to medical supply store to get new one See you back in 1 year

## 2022-06-06 NOTE — Progress Notes (Signed)
[facility]  PATIENT: Mariah Jackson DOB: [birth year]  HISTORY OF PRESENT ILLNESS: HISTORY: Mariah Jackson, 61 year old female returns for followup for RRMS  She was diagnosed with multiple sclerosis in 2001, by Dr. Dellis Filbert at [facility]. The disease has primarily affected her balance and gait. She could only clarify one exacerbation, presenting as worsening balance difficulty, and says she has never been treated with intravenous steroids.   She was placed on Betaseron shortly after diagnosis, and has been on that drug ever since.  Her gait difficulty had little changes over the  years. She ambulates with a cane, but is able to get around the house pretty well without it.   Most recent MRI was in January 2011,shows multiple periventricular, periatrial and subcortical white matter hyperintensities suggestive of demyelinating disease.No enhancing lesions or significant atrophy is seen. compared to MRI films from Aug 2000 there appear to be more T2 lesions in right frontal subcortical region.   She is tired of taking the shots and does have multiple areas of lipoatrophy, especially on the abdominal area. She is ambulating with a cane. She  switched to Tecfidera since Oct 2014, doing well.  UPDATE Sep 18th 2015: She denies signficant side effect from Tecfidera.  She has been on Ampyra '10mg'$  bid for few years, which definitely has helped her walking  MRI brain March 2014: Multiple periventricular and subcortical chronic demyelinating disease. No acute plaques are seen. MRI cervical spine: Disc bulging at C5-6. Spondylosis at C5-6 and C6-7. C5-6 there is focal disc protrusion slightly deforms anterior spinal cord, with spinal stenosis or foraminal stenosis.  No intrinsic or enhancing spinal cord lesions.  UPDATE March 17th 2016: She fell in Feb 2016, now with right shoulder and neck pain, overall has improved, she is continue taking Tecfidera, Ampyra, denies significant side effect,  ambulate with spastic gait, wear her right ankle brace.  UPDATE July 16 2017: She is overall doing well, no flareup, continue have gait abnormality, complains of bilateral knee pain, has valgrus, reviewed laboratory evaluations, normal CMP, elevated alkaline phosphate 151, mild elevated glucose 101, CBC showed low MCV 75, normal TSH  Update March 16, 2018:  She is overall doing very well, no significant change, continue taking Ampyra, using of one-point cane, unsteady gait,  I have personally reviewed MRI of brain with and without contrast in August 2018, multiple T2/FLAIR hyperintensity lesions at both hemisphere, no contrast enhancement, there was no infratentorial lesions, no significant change compared to previous scans.  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  I personally reviewed most recent MRI of brain with without contrast in August 2018, multiple T2/flair hyperintensity foci in the white matter of both hemispheres, with large confluences in the periatrial white matter bilaterally,  Laboratory evaluations in 2020 showed normal TSH, elevated calcium 11, normal creatinine 0.94, normal PTH 63  UPDATE March 14 2020: Her brother drove her to clinic today, she is doing well, ambulate with a cane or walker for longer distance, denies bowel and bladder incontinence, complains of  occasional bilateral calf muscle tightness, especially during morning times, she tolerating Tecfidera twice a day well, also  taking 10 mg twice a day  She denies significant worsening of functional status, actually works better  Update May 31, 2021 SS: Feels MS is stable, got a rollator, chronic right ankle issues, flared up today. No recent falls. She actually thinks she is better, is walking more, can use her cane, goes out and shops. Brother lives with her drives with her. 2 little dogs, Bella, Honey.   MRI brain ordered last visit, not completed, she got scared, doesn't feel necessary. Remains on Tecfidera, Ampyra.  Reviewed recent labs  05/24/21, CMP showed calcium 11.1, normal liver function.  A1c 5.8, TSH 3.090, CBC WBC 4.3, no differential was done. No longer taking gabapentin. Does home exercises arm, leg presses/squeezes, walks on her ramp. Celebrex helps with stiffness. Please how well she is doing.   Update 06/06/22 SS: Doing wonderful, still on Tecfidera, using walker/cane, no fall, feels right knee is weak, chronic right ankle issues, gaining weight, going back on weight watchers, Ampyra helps with her walking stability. Doesn't want an MRI, is afraid. No problems with vision, numbness, b/b. Brakes on walker don't work, duct tape on sides. Lives with her brother, she doesn't drive.  Labs 05/30/22 Vitamin D 32.2, glucose 129, calcium 10.7, TSH 2.170, A1c 5.7.  REVIEW OF SYSTEMS:   See HPI   ALLERGIES: Allergies  Allergen Reactions   Vicodin [Hydrocodone-Acetaminophen] Nausea Only    HOME MEDICATIONS: Outpatient Medications Prior to Visit  Medication Sig Dispense Refill   amLODipine (NORVASC) 10 MG tablet Take 1 tablet (10 mg total) by mouth daily. 90 tablet 3   celecoxib (CELEBREX) 200 MG capsule TAKE 1 CAPSULE TWICE DAILY 180 capsule 3   levothyroxine (SYNTHROID) 100 MCG tablet TAKE 1 TABLET DAILY 90 tablet 3   Multiple Vitamin (MULTIVITAMIN) tablet Take 1 tablet by mouth daily.     omeprazole (PRILOSEC) 20 MG capsule TAKE 1 CAPSULE DAILY 90 capsule 3   rosuvastatin (CRESTOR) 10 MG tablet Take 1  tablet (10 mg total) by mouth daily. 90 tablet 3   spironolactone-hydrochlorothiazide (ALDACTAZIDE) 25-25 MG tablet TAKE 1 TABLET DAILY 90 tablet 3   valsartan (DIOVAN) 80 MG tablet Take 1 tablet (80 mg total) by mouth at bedtime. 90 tablet 0   dalfampridine 10 MG TB12 TAKE 1 TABLET BY MOUTH EVERY 12 HOURS. 60 tablet 5   TECFIDERA 240 MG CPDR TAKE ONE CAPSULE BY MOUTH TWICE DAILY. STORE IN ORIGINAL CONTAINER AT ROOM TEMPERATURE. 60 capsule 3   No facility-administered medications prior to visit.    PAST MEDICAL HISTORY: Past Medical History:  Diagnosis Date   Abnormality of gait 09/10/2013   Arthritis    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   [facility]: Right Breast Core Needle Biopsy 03/12/19: Pathology revealed ATYPICAL DUCTAL HYPERPLASIA ARISING IN A COMPLEX SCLEROSING LESION of the Right breast, medial, middle depth. concordant by Dr. Kristopher Oppenheim, with excision recommended.  Surgical consultation has been arranged with Dr. Erroll Luna at [facility] on March 19, 2019.  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  - SEE  ONCOLOGY TABLE.   COMMENT  ONCOLOGY TABLE-BREAST, IN SITU CARCINOMA ONLY   Essential hypertension 11/09/2014   GERD (gastroesophageal reflux disease)    History of breast cancer 12/30/2005   History of breast lump/mass excision 11/09/2014   Lumpectomy 2007. Pathology report non-cancerous, fibrous tissue.     History of ductal carcinoma in situ (DCIS) of breast 03/05/2019   Excision Biopsy 2017: DUCTAL CARCINOMA IN SITU, INTERMEDIATE GRADE WITH FOCAL  NECROSIS, 2 CM. Estrogen receptor positive  Progesterone receptor positive  8. TNM: pTispNXpMX    History of ductal carcinoma in situ (DCIS) of breast 03/05/2019   Excision Biopsy 2017: DUCTAL CARCINOMA IN SITU, INTERMEDIATE GRADE WITH FOCAL  NECROSIS, 2 CM. Estrogen receptor positive  Progesterone receptor positive  8. TNM: pTispNXpMX   Hypercalcemia, persistent    Hyperlipidemia    Hypertension    Hypothyroidism    Multinodular goiter (nontoxic) 08/29/2016   Multiple sclerosis ([place])    Multiple sclerosis, relapsing-remitting ([place]) 09/10/2013   Personal history of radiation therapy    Pure hypercholesterolemia 11/09/2014   Thyroid enlargement 11/09/2014   Right sided thyroid enlargement      PAST SURGICAL HISTORY: Past Surgical History:  Procedure Laterality Date   BREAST BIOPSY Right 03/12/2019   atypical ductal hyperplasia complex sclerosing lesion   BREAST LUMPECTOMY  2007   left, radiation   MM BREAST STEREO BX*L*R/S Right 03/12/2019   RIGHT BREAST STEREOTACTIC CORE NEEDLE BIOPSY   TUBAL LIGATION  1985    FAMILY HISTORY: Family History  Problem Relation Age of Onset   Stroke Father    Hypertension Mother    Pneumonia Mother    Hypertension Sister    Hypertension Brother    Diabetes Brother 31       Prediabetic   Hypertension Brother    Hypertension Brother    Hypertension Brother    Hypertension Brother    Hypertension Sister    Colon cancer Neg Hx    Breast cancer Neg Hx     SOCIAL HISTORY: Social History   Socioeconomic History   Marital status: Divorced    Spouse name: Not on file   Number of children: 2   Years of education: 12   Highest education level: 12th grade  Occupational History   Occupation: Unemployed    Occupation: Disabiliy  Tobacco Use   Smoking status: Never   Smokeless tobacco: Never   Tobacco comments:    no plans to start  Vaping Use   Vaping Use: Never used  Substance and Sexual Activity   Alcohol use: No   Drug use: No   Sexual activity: Not Currently  Other Topics Concern   Not on file  Social History Narrative    Patient a high school education.    Patient is unemployed.   Patient has 2 children.  Patient lives in her home with her brother, her son and DIL. One story home, ramp at back, 3-4 steps in front. Has grab bar at tub but not toilet. Pets: 2 dogs, Miss Honey and Miss [place].   Smoke alarms in home. Carpet in home, bath mats in bathroom.  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    Patient is divorced.    Eats small meals during day. Meat, veggies, fruit. Has trouble staying asleep. Wakes up about 4 am after going to bed at 1030 pm.   Mobility:  Cane or walker   Transportation: SCAT or family  member   Caregiver: daughter    Emergency Contact: daughter, [name], CNA   Advance Directive:  None   Resources:   -Financial payment plan with the neurology office.   -[place] program participate for MS which covers some medical expense for patient.         DME: cane, walker, BP monitor (digital), partial plate (upper), eyeglasses, ankle brace   Social Determinants of Health   Financial Resource Strain: Not on file  Food Insecurity: Not on file  Transportation Needs: Not on file  Physical Activity: Insufficiently Active (10/08/2018)   Exercise Vital Sign    Days of Exercise per Week: 7 days    Minutes of Exercise per Session: 20 min  Stress: Not on file  Social Connections: Somewhat Isolated (10/08/2018)   Social Connection and Isolation Panel [NHANES]    Frequency of Communication with Friends and Family: More than three times a week    Frequency of Social Gatherings with Friends and Family: Once a week    Attends Religious Services: More than 4 times per year    Active Member of [employer] or Organizations: No    Attends Archivist Meetings: Never    Marital Status: Widowed  Intimate Partner Violence: Not on file   PHYSICAL EXAM  Vitals:   06/06/22 1120  BP: 128/77  Pulse: 97  Weight: 194 lb (88 kg)   Height: '5\' 4"'$  (1.626 m)    Body mass index is 33.3 kg/m.  Physical Exam  General: The patient is alert and cooperative at the time of the examination. Very pleasant, engaging  Skin: No significant peripheral edema is noted.  Neurologic Exam  Mental status: The patient is alert and oriented x 3 at the time of the examination. The patient has apparent normal recent and remote memory, with an apparently normal attention span and concentration ability.  Cranial nerves: Facial symmetry is present. Speech is normal, no aphasia or dysarthria is noted. Extraocular movements are full. Visual fields are full.  Motor: The patient has good strength in all 4 extremities. Right ankle wrapped with brace. Mild weakness right dorsiflexion   Sensory examination: Soft touch sensation is symmetric on the face, arms, and legs.  Coordination: The patient has good finger-nose-finger and heel-to-shin bilaterally.  Gait and station: Relies on a walker to stand,  flat footed gait, cautious, stiff, bilateral valgus   Reflexes: Deep tendon reflexes are symmetric, but slightly increased at the knees  DIAGNOSTIC DATA (LABS, IMAGING, TESTING) - I reviewed patient records, labs, notes, testing and imaging myself where available.      Component Value Date/Time   NA 141 05/30/2022 1121   K 4.0 05/30/2022 1121   CL 101 05/30/2022 1121   CO2 23 05/30/2022 1121   GLUCOSE 129 (H) 05/30/2022 1121   GLUCOSE 91 08/29/2016 1227   BUN 12 05/30/2022 1121   CREATININE 0.93 05/30/2022 1121   CREATININE 0.82 08/29/2016 1227   CALCIUM 10.7 (H) 05/30/2022 1121   PROT 7.6 05/24/2021 1218   ALBUMIN 4.8 05/24/2021 1218   AST 14 05/24/2021 1218   ALT 15 05/24/2021 1218   ALKPHOS 135 (H) 05/24/2021 1218   BILITOT 0.5 05/24/2021 1218   GFRNONAA 75 04/05/2020 1010   GFRNONAA 80 08/29/2016 1227   GFRAA 87 04/05/2020 1010   GFRAA >89 08/29/2016 1227    ASSESSMENT AND PLAN  1. Relapsing remitting multiple  sclerosis 2.  Gait abnormality  -Overall stable, no new issues or worsening -  Continue Tecfidera, Ampyra; feels these have really helped to keep her condition stable -Does not want to pursue MRI of the brain, fearful of it despite discussing open MRI, anti-anxiety medication -Check CBC, LFT -Last MRI of the brain with and without contrast in August 2018 showed evidence of supratentorium MS lesions, no contrast-enhancement, no change compared to prior MRIs -Encouraged to continue exercise, activity -Ordered new rolling walker -Follow-up in 1 year or sooner if needed  Butler Denmark, Laqueta Jean, DNP  [facility] [address], [place], [place] [address] [phone]

## 2022-06-07 LAB — CBC WITH DIFFERENTIAL/PLATELET
Basophils Absolute: 0 10*3/uL (ref 0.0–0.2)
Basos: 0 %
EOS (ABSOLUTE): 0 10*3/uL (ref 0.0–0.4)
Eos: 0 %
Hematocrit: 44.1 % (ref 34.0–46.6)
Hemoglobin: 14.8 g/dL (ref 11.1–15.9)
Immature Grans (Abs): 0 10*3/uL (ref 0.0–0.1)
Immature Granulocytes: 0 %
Lymphocytes Absolute: 0.9 10*3/uL (ref 0.7–3.1)
Lymphs: 21 %
MCH: 26.2 pg — ABNORMAL LOW (ref 26.6–33.0)
MCHC: 33.6 g/dL (ref 31.5–35.7)
MCV: 78 fL — ABNORMAL LOW (ref 79–97)
Monocytes Absolute: 0.4 10*3/uL (ref 0.1–0.9)
Monocytes: 9 %
Neutrophils Absolute: 2.9 10*3/uL (ref 1.4–7.0)
Neutrophils: 70 %
Platelets: 259 10*3/uL (ref 150–450)
RBC: 5.65 x10E6/uL — ABNORMAL HIGH (ref 3.77–5.28)
RDW: 14.4 % (ref 11.7–15.4)
WBC: 4.2 10*3/uL (ref 3.4–10.8)

## 2022-06-07 LAB — HEPATIC FUNCTION PANEL
ALT: 20 IU/L (ref 0–32)
AST: 20 IU/L (ref 0–40)
Albumin: 4.7 g/dL (ref 3.8–4.8)
Alkaline Phosphatase: 126 IU/L — ABNORMAL HIGH (ref 44–121)
Bilirubin Total: 0.5 mg/dL (ref 0.0–1.2)
Bilirubin, Direct: 0.15 mg/dL (ref 0.00–0.40)
Total Protein: 7.6 g/dL (ref 6.0–8.5)

## 2022-06-10 ENCOUNTER — Telehealth: Payer: Self-pay | Admitting: *Deleted

## 2022-06-10 NOTE — Telephone Encounter (Signed)
-----   Message from Suzzanne Cloud, NP sent at 06/10/2022  3:22 PM EDT ----- Please call, blood work is stable, no significant abnormalities.

## 2022-06-10 NOTE — Telephone Encounter (Signed)
I spoke to the patient and provided her with the lab results. 

## 2022-06-28 DIAGNOSIS — H5213 Myopia, bilateral: Secondary | ICD-10-CM | POA: Diagnosis not present

## 2022-07-08 ENCOUNTER — Other Ambulatory Visit: Payer: Self-pay

## 2022-07-08 DIAGNOSIS — E7800 Pure hypercholesterolemia, unspecified: Secondary | ICD-10-CM

## 2022-07-08 MED ORDER — ROSUVASTATIN CALCIUM 10 MG PO TABS: 10.0000 mg | ORAL_TABLET | Freq: Every day | ORAL | 3 refills | Status: DC

## 2022-07-12 ENCOUNTER — Other Ambulatory Visit: Payer: Self-pay | Admitting: Family Medicine

## 2022-07-12 DIAGNOSIS — E7800 Pure hypercholesterolemia, unspecified: Secondary | ICD-10-CM

## 2022-08-16 ENCOUNTER — Other Ambulatory Visit: Payer: Self-pay | Admitting: Family Medicine

## 2022-08-16 DIAGNOSIS — I10 Essential (primary) hypertension: Secondary | ICD-10-CM

## 2022-09-23 ENCOUNTER — Telehealth: Payer: Self-pay | Admitting: Neurology

## 2022-09-23 NOTE — Telephone Encounter (Signed)
Pt states for about a week Calf muscle in right leg is cramping and tightening.  Today she has had pain all day, she'd like a call to discuss something being called in for her.

## 2022-09-23 NOTE — Telephone Encounter (Signed)
I returned the pt's call. She sts over the weekend and today she has been experiencing persistent right leg pain, cramping, tightness. She feels like the symptoms are not new but have worsened. She denies any other symptom changes, isolated only to the right leg. Feels like she is hydrating and eating well, no new sickness/illness. She does report a fall within the last month.  She called to see if a PRN muscle relaxer would help with these symptoms? She is taking Celebrex daily and helps with the pain but not the tightness/cramping. Advised I would fwd to NP for review and talk with her tomorrow.   She was agreeable to this plan.

## 2022-09-24 MED ORDER — TIZANIDINE HCL 4 MG PO TABS: 4.0000 mg | ORAL_TABLET | Freq: Three times a day (TID) | ORAL | 0 refills | Status: DC | PRN

## 2022-09-24 NOTE — Telephone Encounter (Signed)
Attempted to call pt, LVM  

## 2022-09-24 NOTE — Telephone Encounter (Signed)
I will send in tizanidine to try PRN for the muscle cramps. I sent to her local pharmacy for refill.   Meds ordered this encounter  Medications   tiZANidine (ZANAFLEX) 4 MG tablet    Sig: Take 1 tablet (4 mg total) by mouth every 8 (eight) hours as needed for muscle spasms.    Dispense:  30 tablet    Refill:  0

## 2022-09-24 NOTE — Addendum Note (Signed)
Addended by: Suzzanne Cloud on: 09/24/2022 08:56 AM   Modules accepted: Orders

## 2022-10-01 ENCOUNTER — Other Ambulatory Visit: Payer: Self-pay | Admitting: Neurology

## 2022-10-24 ENCOUNTER — Ambulatory Visit (INDEPENDENT_AMBULATORY_CARE_PROVIDER_SITE_OTHER): Payer: Medicare HMO | Admitting: Family Medicine

## 2022-10-24 ENCOUNTER — Encounter: Payer: Self-pay | Admitting: Family Medicine

## 2022-10-24 VITALS — BP 137/76 | HR 100 | Ht 64.0 in | Wt 195.4 lb

## 2022-10-24 DIAGNOSIS — D126 Benign neoplasm of colon, unspecified: Secondary | ICD-10-CM

## 2022-10-24 DIAGNOSIS — M25561 Pain in right knee: Secondary | ICD-10-CM

## 2022-10-24 DIAGNOSIS — G8929 Other chronic pain: Secondary | ICD-10-CM | POA: Diagnosis not present

## 2022-10-24 DIAGNOSIS — N6091 Unspecified benign mammary dysplasia of right breast: Secondary | ICD-10-CM | POA: Diagnosis not present

## 2022-10-24 DIAGNOSIS — Z23 Encounter for immunization: Secondary | ICD-10-CM

## 2022-10-24 DIAGNOSIS — G35 Multiple sclerosis: Secondary | ICD-10-CM

## 2022-10-24 DIAGNOSIS — S8981XA Other specified injuries of right lower leg, initial encounter: Secondary | ICD-10-CM

## 2022-10-24 HISTORY — DX: Other specified injuries of right lower leg, initial encounter: S89.81XA

## 2022-10-24 NOTE — Assessment & Plan Note (Addendum)
Acute worsening of chronic right knee pain  Not much improvement with tizanidine Resolved when restarted her celecoxib DG right knee XRay 03/12/2019: Tricompartmental degeneration, moderate to severely degenerated lateral compartment. Patient able to complete her iADLs and ADLs  Exam Right leg Pain located lateral proximal right calf No deformities of calf muscle TTP along lateral gastrocnemius body to proximal insertion nontender along lateral knee joint Excessive knee hyperextension of knee with standing and walking.  Subjective sense of stiffness with standing after seated for ~ 20 min during interview.   A/ Acute exacerbation of chronic right knee pain     - Several possible pain origin sites: gastroc muscle strain, enthesopathy of gastroc, lateral knee OA, TBS     - Responsive to COX-2 Inhibitor      P/ Continue COX-2 Referral Physical Therapy for consideration of hyperextension knee bracing.   Patient not interested in referral to orthopedics at this time.

## 2022-10-24 NOTE — Assessment & Plan Note (Addendum)
[name] declined referral for a surveillance colonoscopy that was due in 2019.  Denies hematochezia and melena. She said she might consider it at our annual office visit check up in summer next year.

## 2022-10-24 NOTE — Patient Instructions (Signed)
Your blood pressure is under good control.    CoViD-19 Vaccine The CDC recommends the COVID-19 monovalent 2023-24 vaccination for everyone ages 76 months and older in the [place] for the prevention of COVID-19. There is no preferential recommendation for the use of any one COVID-19 vaccine over another when more than one recommended and age-appropriate vaccine is available.      SHINGLES VACCINATION  Chickenpox and shingles are related because they are caused by the same virus (varicella-zoster virus). After a person recovers from chickenpox, the virus stays dormant (inactive) in the body. It can reactivate years later and cause shingles.  CDC recommends that adults 56 years and older get two doses of the shingles vaccine called Shingrix (recombinant zoster vaccine) to prevent shingles and the complications from the disease.  Shingrix vaccination provides strong protection against shingles. In adults 50 years Shingrix is more than 90% effective at preventing shingles.  Studies show that Shingrix is safe. The vaccine helps your body create a strong defense against shingles. As a result, you are likely to have temporary side effects from getting the shots. Some people feel they are having a mild flu without the head cold symptoms.  The side effects might affect your ability to do normal daily activities for 2 to 3 days.  Getting the shot when you have a couple days without commitments is a good idea, in case you do feel under the weather.  Medicare prescription drug plans (Part D) usually cover all available vaccines needed to prevent illness, like the shingles (Shingrix) shot. Contact your Medicare drug plan If you are uncertain if they will cover the shingles vaccination.   Your pharmacist can give you Shingrix as a shot in your upper arm. You will need two Shingrix vaccinations to complete your inoculation against shingles.  The second injection is given at least 2 months after the first  Shingrix vaccination was given.

## 2022-10-24 NOTE — Assessment & Plan Note (Signed)
Patient again dclined referral to general surgery of the atypical ductal hyperplasia of right breast.  She wants to wait until annual visit with PCP and her annual mammogram to address it. She feel stressed because of her sister who has metastatic Breast cancer.

## 2022-10-24 NOTE — Assessment & Plan Note (Signed)
Established problem. No bodily pains, no abdominal pains, no flank pain/hematuria    Latest Ref Rng & Units 05/30/2022   11:21 AM 05/24/2021   12:18 PM 04/05/2020   10:10 AM  BMP  Glucose 70 - 99 mg/dL 129   89     BUN 8 - 27 mg/dL 12   15     Creatinine 0.57 - 1.00 mg/dL 0.93   0.92   0.84    BUN/Creat Ratio 12 - '28 13   16     '$ Sodium 134 - 144 mmol/L 141   140     Potassium 3.5 - 5.2 mmol/L 4.0   3.5     Chloride 96 - 106 mmol/L 101   102     CO2 20 - 29 mmol/L 23   22     Calcium 8.7 - 10.3 mg/dL 10.7   11.1   10.5     Mild elevation in serum calcium.  No symptoms  A/ Primary Hyperparathyroidism - asymptomatic.       P/ Annual monitoring.  Check iPTH with Ca++ next visit.

## 2022-10-24 NOTE — Progress Notes (Signed)
Mariah Jackson is alone Sources of clinical information for visit is/are patient. Nursing assessment for this office visit was reviewed with the patient for accuracy and revision.     Previous Report(s) Reviewed: recent TC from patient to [facility]     05/30/2022    8:42 AM  Depression screen PHQ 2/9  Decreased Interest 0  Down, Depressed, Hopeless 0  PHQ - 2 Score 0  Altered sleeping 0  Tired, decreased energy 0  Change in appetite 0  Feeling bad or failure about yourself  0  Trouble concentrating 0  Moving slowly or fidgety/restless 0  Suicidal thoughts 0  PHQ-9 Score 0   Flowsheet Row Office Visit from 05/30/2022 in [place] Office Visit from 05/24/2021 in [place] Office Visit from 10/16/2018 in [place] for [facility]  Thoughts that you would be better off dead, or of hurting yourself in some way Not at all Not at all Not at all  PHQ-9 Total Score 0 0 4          02/10/2020    9:19 AM 03/04/2019   10:09 AM 10/08/2018   10:21 AM 02/05/2018    8:27 AM 01/02/2017    2:17 PM  Fall Risk   Falls in the past year? 0 0 Yes Yes No  Number falls in past yr:   1 1   Injury with Fall?   No No   Risk for fall due to :   Impaired balance/gait;Impaired mobility    Follow up   Falls prevention discussed         05/30/2022    8:42 AM 05/24/2021   10:10 AM 02/10/2020    9:20 AM  PHQ9 SCORE ONLY  PHQ-9 Total Score 0 0 0    Adult vaccines due  Topic Date Due   TETANUS/TDAP  05/08/2026    Health Maintenance Due  Topic Date Due   Zoster Vaccines- Shingrix (1 of 2) Never done   COLONOSCOPY (Pts 45-5yr Insurance coverage will need to be confirmed)  05/07/2018   Medicare Annual Wellness (AWV)  10/09/2019   COVID-19 Vaccine (4 - Pfizer risk series) 07/19/2021   PAP SMEAR-Modifier  10/16/2021      History/P.E. limitations: none  Adult vaccines due  Topic Date Due   TETANUS/TDAP  05/08/2026   There are no preventive care  reminders to display for this patient.  Health Maintenance Due  Topic Date Due   Zoster Vaccines- Shingrix (1 of 2) Never done   COLONOSCOPY (Pts 45-449yrInsurance coverage will need to be confirmed)  05/07/2018   Medicare Annual Wellness (AWV)  10/09/2019   COVID-19 Vaccine (4 - Pfizer risk series) 07/19/2021   PAP SMEAR-Modifier  10/16/2021     Chief Complaint  Patient presents with   Knee Pain   Subjective:    Mariah Jackson a [age].o. female who presents with right lower leg pain. Onset of the symptoms was gradual, starting about 2 weeks ago. Pain is currently resolved. Pain was described as cramping and dull, The pain was constant.  Associated  symptoms include: stiffness. Impact of symptoms on Mariah Jackson been that she Jackson increased difficulty going from seated to standing. Patient Jackson had prior leg problems. Evaluation to date: plain films: right knee with tricompartmental degenerative changes . Treatment to date:  celecoxib . Past musculoskeletal history:  Mariah related right leg weakness and right foot drop . Mariah Jackson a rollator walker with ambulation.  Pain in calf resolved once whe was able to obtain and take her celecoxib.   Review of Systems No fever, no joint swelling or redness, no rashes      Objective:    BP 137/76   Pulse 100   Ht '5\' 4"'$  (1.626 m)   Wt 195 lb 6 oz (88.6 kg)   SpO2 100%   BMI 33.54 kg/m   See problem list for exam  Visit Problem List with A/P  Primary hyperparathyroidism ([place]) Established problem. No bodily pains, no abdominal pains, no flank pain/hematuria     Latest Ref Rng & Units 05/30/2022   11:21 AM 05/24/2021   12:18 PM 04/05/2020   10:10 AM  BMP  Glucose 70 - 99 mg/dL 129   89      BUN 8 - 27 mg/dL 12   15      Creatinine 0.57 - 1.00 mg/dL 0.93   0.92   0.84    BUN/Creat Ratio 12 - '28 13   16      '$ Sodium 134 - 144 mmol/L 141   140      Potassium 3.5 - 5.2 mmol/L 4.0   3.5      Chloride 96 - 106 mmol/L 101   102      CO2 20 -  29 mmol/L 23   22      Calcium 8.7 - 10.3 mg/dL 10.7   11.1   10.5      Mild elevation in serum calcium.  No symptoms   A/ Primary Hyperparathyroidism - asymptomatic.       P/ Annual monitoring.  Check iPTH with Ca++ next visit.    Atypical ductal hyperplasia of right breast Patient again dclined referral to general surgery of the atypical ductal hyperplasia of right breast.  She wants to wait until annual visit with PCP and her annual mammogram to address it. She feel stressed because of her sister who Jackson metastatic Breast cancer.   Tubular adenoma of colon Mariah Jackson declined referral for a surveillance colonoscopy that was due in 2019.  Denies hematochezia and melena. She said she might consider it at our annual office visit check up in summer next year.   Chronic pain of right knee Acute worsening of chronic right knee pain  Not much improvement with tizanidine Resolved when restarted her celecoxib DG right knee XRay 03/12/2019: Tricompartmental degeneration, moderate to severely degenerated lateral compartment. Patient able to complete her iADLs and ADLs  Exam Right leg Pain located lateral proximal right calf No deformities of calf muscle TTP along lateral gastrocnemius body to proximal insertion nontender along lateral knee joint Excessive knee hyperextension of knee with standing and walking.  Subjective sense of stiffness with standing after seated for ~ 20 min during interview.   A/ Acute exacerbation of chronic right knee pain     - Several possible pain origin sites: gastroc muscle strain, enthesopathy of gastroc, lateral knee OA, TBS     - Responsive to COX-2 Inhibitor      P/ Continue COX-2 Referral Physical Therapy for consideration of hyperextension knee bracing.   Patient not interested in referral to orthopedics at this time.      CPT E&M Office Visit Time Before Visit; reviewing medical records (e.g. recent visits, labs, studies): 5 minutes During  Visit (F2F time): 25 minutes After Visit (discussion with family or HCP, prescribing, ordering, referring, calling result/recommendations or documenting on same day): 5 minutes Total Visit Time: 30 minutes  Level  2 Est: 10-19 min     New: 15-29 min Level 3 Est: 20-29 min     New: 30-44 min Level 4 Est: 30-39 min     New: 45-59 min Level 5 Est: 40-54 min     New: 60-74 min   > Level 5 - see prolonged service CPT E&M codes; [id number]

## 2022-10-27 ENCOUNTER — Other Ambulatory Visit: Payer: Self-pay | Admitting: Family Medicine

## 2022-10-27 DIAGNOSIS — E038 Other specified hypothyroidism: Secondary | ICD-10-CM

## 2022-10-27 DIAGNOSIS — I10 Essential (primary) hypertension: Secondary | ICD-10-CM

## 2022-11-04 ENCOUNTER — Ambulatory Visit: Payer: Medicare HMO | Attending: Family Medicine

## 2022-11-04 DIAGNOSIS — R278 Other lack of coordination: Secondary | ICD-10-CM | POA: Insufficient documentation

## 2022-11-04 DIAGNOSIS — R262 Difficulty in walking, not elsewhere classified: Secondary | ICD-10-CM | POA: Diagnosis not present

## 2022-11-04 DIAGNOSIS — G35 Multiple sclerosis: Secondary | ICD-10-CM | POA: Diagnosis not present

## 2022-11-04 DIAGNOSIS — M25561 Pain in right knee: Secondary | ICD-10-CM | POA: Diagnosis not present

## 2022-11-04 DIAGNOSIS — M6281 Muscle weakness (generalized): Secondary | ICD-10-CM | POA: Insufficient documentation

## 2022-11-04 DIAGNOSIS — R2689 Other abnormalities of gait and mobility: Secondary | ICD-10-CM | POA: Insufficient documentation

## 2022-11-04 DIAGNOSIS — G8929 Other chronic pain: Secondary | ICD-10-CM | POA: Insufficient documentation

## 2022-11-04 DIAGNOSIS — S8981XA Other specified injuries of right lower leg, initial encounter: Secondary | ICD-10-CM | POA: Diagnosis not present

## 2022-11-04 NOTE — Therapy (Signed)
OUTPATIENT PHYSICAL THERAPY NEURO EVALUATION   Patient Name: Mariah Jackson MRN: [id number] DOB:[birth year], 61 y.o., female Today's Date: 11/04/2022   PCP: McDiarmid, Blane Ohara, MD REFERRING PROVIDER: McDiarmid, Blane Ohara, MD   PT End of Session - 11/04/22 0834     Visit Number 1    Number of Visits 9    Date for PT Re-Evaluation 12/09/22    Authorization Type [employer]    PT Start Time 0840    PT Stop Time 0924    PT Time Calculation (min) 44 min    Equipment Utilized During Treatment Gait belt    Activity Tolerance Patient tolerated treatment well    Behavior During Therapy [facility] for tasks assessed/performed             Past Medical History:  Diagnosis Date   Abnormality of gait 09/10/2013   Arthritis    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   [facility]: Right Breast Core Needle Biopsy 03/12/19: Pathology revealed ATYPICAL DUCTAL HYPERPLASIA ARISING IN A COMPLEX SCLEROSING LESION of the Right breast, medial, middle depth. concordant by Dr. Kristopher Oppenheim, with excision recommended.  Surgical consultation has been arranged with Dr. Erroll Luna at [facility] on March 19, 2019.  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  - SEE ONCOLOGY TABLE.   COMMENT  ONCOLOGY TABLE-BREAST, IN SITU CARCINOMA ONLY   Essential hypertension 11/09/2014   GERD (gastroesophageal reflux disease)    History of breast cancer 12/30/2005   History of breast lump/mass excision 11/09/2014   Lumpectomy 2007. Pathology report non-cancerous, fibrous tissue.     History of ductal carcinoma in situ (DCIS) of breast 03/05/2019   Excision Biopsy 2017: DUCTAL CARCINOMA IN SITU,  INTERMEDIATE GRADE WITH FOCAL  NECROSIS, 2 CM. Estrogen receptor positive  Progesterone receptor positive  8. TNM: pTispNXpMX   History of ductal carcinoma in situ (DCIS) of breast 03/05/2019   Excision Biopsy 2017: DUCTAL CARCINOMA IN SITU, INTERMEDIATE GRADE WITH FOCAL  NECROSIS, 2 CM. Estrogen receptor positive  Progesterone receptor positive  8. TNM: pTispNXpMX   Hypercalcemia, persistent    Hyperlipidemia    Hypertension    Hypothyroidism    Multinodular goiter (nontoxic) 08/29/2016   Multiple sclerosis ([place])    Multiple sclerosis, relapsing-remitting ([place]) 09/10/2013   Personal history of radiation therapy    Pure hypercholesterolemia 11/09/2014   Thyroid enlargement 11/09/2014   Right sided thyroid enlargement     Past Surgical History:  Procedure Laterality Date   BREAST BIOPSY Right 03/12/2019   atypical ductal hyperplasia complex sclerosing lesion   BREAST LUMPECTOMY  2007   left, radiation   MM BREAST STEREO BX*L*R/S Right 03/12/2019   RIGHT BREAST STEREOTACTIC CORE NEEDLE BIOPSY   TUBAL LIGATION  1985   Patient Active Problem List   Diagnosis Date Noted   Hyperextension injury of knee, right, initial encounter 10/24/2022   At risk for diabetes mellitus 05/25/2021   Primary hyperparathyroidism ([place]) 04/10/2020   Gait abnormality 03/14/2020   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   Chronic pain of right knee 07/16/2017   Multinodular goiter (nontoxic) 08/29/2016   Gastroesophageal reflux disease 08/29/2016  Essential hypertension 11/09/2014   Hypothyroidism 11/09/2014   Pure hypercholesterolemia 11/09/2014   Tubular adenoma of colon 12/30/2012   Multiple sclerosis, relapsing-remitting ([place]) 12/31/1999    ONSET DATE: 10/24/2022 referral  REFERRING DIAG: M25.561,G89.29 (ICD-10-CM) - Chronic pain of right knee S89.81XA (ICD-10-CM) - Hyperextension injury of knee, right, initial encounter G35 (ICD-10-CM) - Multiple sclerosis, relapsing-remitting  ([place])  THERAPY DIAG:  Difficulty in walking, not elsewhere classified  Other abnormalities of gait and mobility  Other lack of coordination  Muscle weakness (generalized)  Rationale for Evaluation and Treatment: Rehabilitation  SUBJECTIVE:                                                                                                                                                                                             SUBJECTIVE STATEMENT: Patient arrives to OP PT using rollator, wearing flat watershoe-like shoes. Patient reports pain/muscle tightness in R posterior calf. She reports feeling as though her R foot is "curving in." She's currently wearing an ASO in her R ankle. She reports previously having a brace that sounds like an AFO, but it no longer fits.  Pt accompanied by: self  PERTINENT HISTORY: RR MS, HLD, HTN  PAIN:  Are you having pain? Yes: NPRS scale: 2-3/10 Pain location: R knee Pain description: soreness  PRECAUTIONS: Fall  WEIGHT BEARING RESTRICTIONS: No  FALLS: Has patient fallen in last 6 months? Yes. Number of falls 2; with both falls R LE "gave out," unable to use rollator in the bathroom   LIVING ENVIRONMENT: Lives with: lives with their family Lives in: House/apartment Stairs: Yes: External: 4-5 steps; can reach both Has following equipment at home: Single point cane, Walker - 4 wheeled, shower chair, and Ramped entry  PLOF: Requires assistive device for independence  PATIENT GOALS: "to strengthen my leg and see if a brace will help me"   OBJECTIVE:   DIAGNOSTIC FINDINGS: DG right knee XRay 03/12/2019: Tricompartmental degeneration, moderate to severely degenerated lateral compartment  COGNITION: Overall cognitive status: Within functional limits for tasks assessed   SENSATION: WFL  COORDINATION: Difficulty coordinating figure 8 with B LE (slightly dysmetric)   POSTURE: No Significant postural limitations  LOWER EXTREMITY MMT:     MMT Right Eval Left Eval  Hip flexion 3+ 3+  Hip extension    Hip abduction 4 4  Hip adduction 4 4  Hip internal rotation    Hip external rotation    Knee flexion 4 4  Knee extension 3+ 4  Ankle dorsiflexion 4 4  Ankle plantarflexion 4 4  Ankle inversion    Ankle eversion    (Blank rows =  not tested)  BED MOBILITY:  Reports no difficulty   TRANSFERS: Assistive device utilized: Environmental consultant - 4 wheeled  Sit to stand: Modified independence Stand to sit: Modified independence Chair to chair: Modified independence   GAIT: Gait pattern:  R knee genu valgum, step through pattern, decreased stride length, decreased hip/knee flexion- Right, circumduction- Right, genu recurvatum- Right, genu recurvatum- Left, trendelenburg, lateral hip instability, trunk flexed, wide BOS, poor foot clearance- Right, and poor foot clearance- Left Distance walked: clinic Assistive device utilized: Walker - 4 wheeled Level of assistance: Modified independence   FUNCTIONAL TESTS:   [facility] PT Assessment - 11/04/22 0001       Standardized Balance Assessment   Standardized Balance Assessment 10 meter walk test    Five times sit to stand comments  15    10 Meter Walk .19ms      Timed Up and Go Test   Normal TUG (seconds) 19.4               TODAY'S TREATMENT:                                                                                                                              N/a eval    PATIENT EDUCATION: Education details: PT POC, exam findings, previous x-ray report and mobility implications, bracing options given current knee anatomy, shoes for PT Person educated: Patient Education method: Explanation Education comprehension: verbalized understanding  HOME EXERCISE PROGRAM: To be provided  GOALS: Goals reviewed with patient? Yes  SHORT TERM GOALS: = LTG based on POC length  LONG TERM GOALS: Target date: 12/02/2022  Pt will be independent with final HEP for improved  balance and strength  Baseline:  to be provided Goal status: INITIAL  2.  Pt will improve 5x STS to </= 12 sec to demo improved functional LE strength and balance   Baseline: 15 with B UE Goal status: INITIAL  3.  Pt will improve TUG to </= 14 secs to demonstrated reduced fall risk  Baseline: 19.4s with Rollator Goal status: INITIAL  4.  Pt will improve gait speed to >/= .580m to demonstrate improved community ambulation  Baseline: .476mGoal status: INITIAL    ASSESSMENT:  CLINICAL IMPRESSION: Patient is a 61 55o. female who was seen today for physical therapy evaluation and treatment for gait impairment due to R knee dysfunction. Patient completed the Timed Up and Go test (TUG) in 19.4 seconds.  Geriatrics: need for further assessment of fall risk: ? 12 sec; Recurrent falls: > 15 sec; Vestibular Disorders fall risk: > 15 sec; Parkinson's Disease fall risk: > 16 sec ([email or web address]) 10 Meter Walk Test: Patient instructed to walk 10 meters (32.8 ft) as quickly and as safely as possible at their normal speed x2 and at a fast speed x2. Time measured from 2 meter mark to 8 meter mark to accommodate ramp-up and ramp-down.  Normal speed: .8m48mut off scores: <0.4 m/s = household  Ambulator, 0.4-0.8 m/s = limited community Ambulator, >0.8 m/s = community Ambulator, >1.2 m/s = crossing a street, <1.0 = increased fall risk MCID 0.05 m/s (small), 0.13 m/s (moderate), 0.06 m/s (significant)  (ANPTA Core Set of Outcome Measures for Adults with Neurologic Conditions, 2018) Five times Sit to Stand Test (FTSS) Method: Use a straight back chair with a solid seat that is 17-18" high. Ask participant to sit on the chair with arms folded across their chest.   Instructions: "Stand up and sit down as quickly as possible 5 times, keeping your arms folded across your chest."   Measurement: Stop timing when the participant touches the chair in sitting the 5th time.  TIME: 15 sec with  BUE  support  Cut off scores indicative of increased fall risk: >12 sec CVA, >16 sec PD, >13 sec vestibular (ANPTA Core Set of Outcome Measures for Adults with Neurologic Conditions, 2018). Patient would benefit from skilled PT services to address the above mentioned deficits.    OBJECTIVE IMPAIRMENTS: Abnormal gait, decreased balance, decreased coordination, decreased knowledge of condition, decreased knowledge of use of DME, difficulty walking, decreased strength, and pain.   ACTIVITY LIMITATIONS: carrying, standing, squatting, stairs, transfers, hygiene/grooming, locomotion level, and caring for others  PARTICIPATION LIMITATIONS: cleaning, interpersonal relationship, driving, shopping, community activity, occupation, and yard work  PERSONAL FACTORS: Fitness, Past/current experiences, Sex, Time since onset of injury/illness/exacerbation, and 3+ comorbidities: RR MS, HTN, HLD  are also affecting patient's functional outcome.   REHAB POTENTIAL: Fair time since onset  CLINICAL DECISION MAKING: Evolving/moderate complexity  EVALUATION COMPLEXITY: Moderate  PLAN:  PT FREQUENCY: 2x/week  PT DURATION: 4 weeks  PLANNED INTERVENTIONS: Therapeutic exercises, Therapeutic activity, Neuromuscular re-education, Balance training, Gait training, Patient/Family education, Self Care, Joint mobilization, Stair training, Vestibular training, Visual/preceptual remediation/compensation, Orthotic/Fit training, DME instructions, Aquatic Therapy, Dry Needling, Electrical stimulation, Taping, Manual therapy, and Re-evaluation  PLAN FOR NEXT SESSION: did she bring sneakers? Trial heel wedges/AFOs, swedish knee cage that we have is too small for her extent of genu valgus   Debbora Dus, PT Debbora Dus, PT, DPT, CBIS  11/04/2022, 9:24 AM

## 2022-11-13 ENCOUNTER — Ambulatory Visit: Payer: Medicare HMO

## 2022-11-13 DIAGNOSIS — R2689 Other abnormalities of gait and mobility: Secondary | ICD-10-CM

## 2022-11-13 DIAGNOSIS — G8929 Other chronic pain: Secondary | ICD-10-CM | POA: Diagnosis not present

## 2022-11-13 DIAGNOSIS — R262 Difficulty in walking, not elsewhere classified: Secondary | ICD-10-CM

## 2022-11-13 DIAGNOSIS — M6281 Muscle weakness (generalized): Secondary | ICD-10-CM

## 2022-11-13 DIAGNOSIS — G35 Multiple sclerosis: Secondary | ICD-10-CM | POA: Diagnosis not present

## 2022-11-13 DIAGNOSIS — S8981XA Other specified injuries of right lower leg, initial encounter: Secondary | ICD-10-CM | POA: Diagnosis not present

## 2022-11-13 DIAGNOSIS — R278 Other lack of coordination: Secondary | ICD-10-CM

## 2022-11-13 DIAGNOSIS — M25561 Pain in right knee: Secondary | ICD-10-CM | POA: Diagnosis not present

## 2022-11-13 NOTE — Therapy (Signed)
OUTPATIENT PHYSICAL THERAPY NEURO EVALUATION   Patient Name: Mariah Jackson MRN: [id number] DOB:1961/11/01, 61 y.o., female Today's Date: 11/13/2022   PCP: McDiarmid, Blane Ohara, MD REFERRING PROVIDER: McDiarmid, Blane Ohara, MD   PT End of Session - 11/13/22 0837     Visit Number 2    Number of Visits 9    Date for PT Re-Evaluation 12/09/22    Authorization Type [employer] Medicare    PT Start Time 0845    PT Stop Time 0926    PT Time Calculation (min) 41 min    Activity Tolerance Patient tolerated treatment well    Behavior During Therapy [facility] for tasks assessed/performed             Past Medical History:  Diagnosis Date   Abnormality of gait 09/10/2013   Arthritis    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 concordant by Dr. Kristopher Oppenheim, with excision recommended.  Surgical consultation has been arranged with Dr. Erroll Luna at [facility] on March 19, 2019.  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  - SEE ONCOLOGY TABLE.   COMMENT  ONCOLOGY TABLE-BREAST, IN SITU CARCINOMA ONLY   Essential hypertension 11/09/2014   GERD (gastroesophageal reflux disease)    History of breast cancer 12/30/2005   History of breast lump/mass excision 11/09/2014   Lumpectomy 2007. Pathology report non-cancerous, fibrous tissue.     History of ductal carcinoma in situ (DCIS) of breast 03/05/2019   Excision Biopsy 2017: DUCTAL CARCINOMA IN SITU, INTERMEDIATE GRADE WITH FOCAL  NECROSIS, 2 CM. Estrogen  receptor positive  Progesterone receptor positive  8. TNM: pTispNXpMX   History of ductal carcinoma in situ (DCIS) of breast 03/05/2019   Excision Biopsy 2017: DUCTAL CARCINOMA IN SITU, INTERMEDIATE GRADE WITH FOCAL  NECROSIS, 2 CM. Estrogen receptor positive  Progesterone receptor positive  8. TNM: pTispNXpMX   Hypercalcemia, persistent    Hyperlipidemia    Hypertension    Hypothyroidism    Multinodular goiter (nontoxic) 08/29/2016   Multiple sclerosis ([place])    Multiple sclerosis, relapsing-remitting ([place]) 09/10/2013   Personal history of radiation therapy    Pure hypercholesterolemia 11/09/2014   Thyroid enlargement 11/09/2014   Right sided thyroid enlargement     Past Surgical History:  Procedure Laterality Date   BREAST BIOPSY Right 03/12/2019   atypical ductal hyperplasia complex sclerosing lesion   BREAST LUMPECTOMY  2007   left, radiation   MM BREAST STEREO BX*L*R/S Right 03/12/2019   RIGHT BREAST STEREOTACTIC CORE NEEDLE BIOPSY   TUBAL LIGATION  1985   Patient Active Problem List   Diagnosis Date Noted   Hyperextension injury of knee, right, initial encounter 10/24/2022   At risk for diabetes mellitus 05/25/2021   Primary hyperparathyroidism ([place]) 04/10/2020   Gait abnormality 03/14/2020   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   Chronic pain of right knee 07/16/2017   Multinodular goiter (nontoxic) 08/29/2016   Gastroesophageal reflux disease 08/29/2016   Essential hypertension 11/09/2014   Hypothyroidism 11/09/2014  Pure hypercholesterolemia 11/09/2014   Tubular adenoma of colon 12/30/2012   Multiple sclerosis, relapsing-remitting ([place]) 12/31/1999    ONSET DATE: 10/24/2022 referral  REFERRING DIAG: M25.561,G89.29 (ICD-10-CM) - Chronic pain of right knee S89.81XA (ICD-10-CM) - Hyperextension injury of knee, right, initial encounter G35 (ICD-10-CM) - Multiple sclerosis, relapsing-remitting ([place])  THERAPY DIAG:  Difficulty in walking, not elsewhere  classified  Other abnormalities of gait and mobility  Other lack of coordination  Muscle weakness (generalized)  Rationale for Evaluation and Treatment: Rehabilitation  SUBJECTIVE:                                                                                                                                                                                             SUBJECTIVE STATEMENT: Patient reports doing well. Denies falls/near falls. Patient wearing slip on shoes again- unable to trial bracing or heel wedges today. She did order sneakers, but they haven't arrived yet.  Pt accompanied by: self  PERTINENT HISTORY: RR MS, HLD, HTN  PAIN:  Are you having pain? No  PRECAUTIONS: Fall  WEIGHT BEARING RESTRICTIONS: No  PATIENT GOALS: "to strengthen my leg and see if a brace will help me"     TODAY'S TREATMENT:                                                                                                                              Therex:  -scifit hill level 1 x10 mins B UE/LE for improved LE strength/endurance -HEP (see below)   Theract:  -discussed importance of wearing sneakers for safety and for addition of safe bracing   PATIENT EDUCATION: Education details: HEP, shoes for PT Person educated: Patient Education method: Explanation Education comprehension: verbalized understanding  HOME EXERCISE PROGRAM: Access Code: [email or web address] URL: [email or web address] Date: 11/13/2022 Prepared by: Estevan Ryder  Exercises - Supine Bridge  - 1 x daily - 7 x weekly - 3 sets - 10 reps - Supine Active Straight Leg Raise  - 1 x daily - 7 x weekly - 3 sets - 10 reps - Sidelying Hip Abduction  - 1 x  daily - 7 x weekly - 3 sets - 10 reps - Mini Squat with Counter Support  - 1 x daily - 7 x weekly - 3 sets - 10 reps  GOALS: Goals reviewed with patient? Yes  SHORT TERM GOALS: = LTG based on POC length  LONG TERM GOALS: Target date: 12/02/2022  Pt will be  independent with final HEP for improved balance and strength  Baseline:  to be provided Goal status: INITIAL  2.  Pt will improve 5x STS to </= 12 sec to demo improved functional LE strength and balance   Baseline: 15 with B UE Goal status: INITIAL  3.  Pt will improve TUG to </= 14 secs to demonstrated reduced fall risk  Baseline: 19.4s with Rollator Goal status: INITIAL  4.  Pt will improve gait speed to >/= .54ms to demonstrate improved community ambulation  Baseline: .458m Goal status: INITIAL    ASSESSMENT:  CLINICAL IMPRESSION: Patient seen for skilled PT session with emphasis on establishing HEP. Tolerated therex well with no increase in pain. Does require up to MaxA to complete all therex with correct form and to slow down movements to emphasize control/ stability. Continue POC.   OBJECTIVE IMPAIRMENTS: Abnormal gait, decreased balance, decreased coordination, decreased knowledge of condition, decreased knowledge of use of DME, difficulty walking, decreased strength, and pain.   ACTIVITY LIMITATIONS: carrying, standing, squatting, stairs, transfers, hygiene/grooming, locomotion level, and caring for others  PARTICIPATION LIMITATIONS: cleaning, interpersonal relationship, driving, shopping, community activity, occupation, and yard work  PERSONAL FACTORS: Fitness, Past/current experiences, Sex, Time since onset of injury/illness/exacerbation, and 3+ comorbidities: RR MS, HTN, HLD  are also affecting patient's functional outcome.   REHAB POTENTIAL: Fair time since onset  CLINICAL DECISION MAKING: Evolving/moderate complexity  EVALUATION COMPLEXITY: Moderate  PLAN:  PT FREQUENCY: 2x/week  PT DURATION: 4 weeks  PLANNED INTERVENTIONS: Therapeutic exercises, Therapeutic activity, Neuromuscular re-education, Balance training, Gait training, Patient/Family education, Self Care, Joint mobilization, Stair training, Vestibular training, Visual/preceptual  remediation/compensation, Orthotic/Fit training, DME instructions, Aquatic Therapy, Dry Needling, Electrical stimulation, Taping, Manual therapy, and Re-evaluation  PLAN FOR NEXT SESSION: did she bring sneakers? Trial heel wedges/AFOs, swedish knee cage that we have is too small for her extent of genu valgus   JeDebbora DusPT JeDebbora DusPT, DPT, CBIS  11/13/2022, 9:27 AM

## 2022-11-15 ENCOUNTER — Ambulatory Visit: Payer: Medicare HMO | Admitting: Physical Therapy

## 2022-11-15 ENCOUNTER — Other Ambulatory Visit: Payer: Self-pay | Admitting: Family Medicine

## 2022-11-15 DIAGNOSIS — I10 Essential (primary) hypertension: Secondary | ICD-10-CM

## 2022-11-25 ENCOUNTER — Ambulatory Visit: Payer: Medicare HMO | Admitting: Physical Therapy

## 2022-11-25 ENCOUNTER — Encounter: Payer: Self-pay | Admitting: Physical Therapy

## 2022-11-25 DIAGNOSIS — R262 Difficulty in walking, not elsewhere classified: Secondary | ICD-10-CM

## 2022-11-25 DIAGNOSIS — R2689 Other abnormalities of gait and mobility: Secondary | ICD-10-CM | POA: Diagnosis not present

## 2022-11-25 DIAGNOSIS — M6281 Muscle weakness (generalized): Secondary | ICD-10-CM | POA: Diagnosis not present

## 2022-11-25 DIAGNOSIS — G35 Multiple sclerosis: Secondary | ICD-10-CM | POA: Diagnosis not present

## 2022-11-25 DIAGNOSIS — S8981XA Other specified injuries of right lower leg, initial encounter: Secondary | ICD-10-CM | POA: Diagnosis not present

## 2022-11-25 DIAGNOSIS — R278 Other lack of coordination: Secondary | ICD-10-CM | POA: Diagnosis not present

## 2022-11-25 DIAGNOSIS — G8929 Other chronic pain: Secondary | ICD-10-CM | POA: Diagnosis not present

## 2022-11-25 DIAGNOSIS — M25561 Pain in right knee: Secondary | ICD-10-CM | POA: Diagnosis not present

## 2022-11-25 NOTE — Therapy (Signed)
OUTPATIENT PHYSICAL THERAPY NEURO TREATMENT   Patient Name: Mariah Jackson MRN: [id number] DOB:11/17/1961, 61 y.o., female Today's Date: 11/25/2022   PCP: McDiarmid, Blane Ohara, MD REFERRING PROVIDER: McDiarmid, Blane Ohara, MD   PT End of Session - 11/25/22 1030     Visit Number 3    Number of Visits 9    Date for PT Re-Evaluation 12/09/22    Authorization Type [employer] Medicare    PT Start Time 1016    PT Stop Time 1055    PT Time Calculation (min) 39 min    Activity Tolerance Patient tolerated treatment well    Behavior During Therapy WFL for tasks assessed/performed              Past Medical History:  Diagnosis Date   Abnormality of gait 09/10/2013   Arthritis    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   [facility]: Right Breast Core Needle Biopsy 03/12/19: Pathology revealed ATYPICAL DUCTAL HYPERPLASIA ARISING IN A COMPLEX SCLEROSING LESION of the Right breast, medial, middle depth. concordant by Dr. Kristopher Oppenheim, with excision recommended.  Surgical consultation has been arranged with Dr. Erroll Luna at [facility] on March 19, 2019.  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  - SEE ONCOLOGY TABLE.   COMMENT  ONCOLOGY TABLE-BREAST, IN SITU CARCINOMA ONLY   Essential hypertension 11/09/2014   GERD (gastroesophageal reflux disease)    History of breast cancer 12/30/2005   History of breast lump/mass excision 11/09/2014   Lumpectomy 2007. Pathology report non-cancerous, fibrous tissue.     History of ductal carcinoma in situ (DCIS) of breast 03/05/2019   Excision Biopsy 2017: DUCTAL CARCINOMA IN SITU, INTERMEDIATE GRADE WITH FOCAL  NECROSIS, 2 CM. Estrogen  receptor positive  Progesterone receptor positive  8. TNM: pTispNXpMX   History of ductal carcinoma in situ (DCIS) of breast 03/05/2019   Excision Biopsy 2017: DUCTAL CARCINOMA IN SITU, INTERMEDIATE GRADE WITH FOCAL  NECROSIS, 2 CM. Estrogen receptor positive  Progesterone receptor positive  8. TNM: pTispNXpMX   Hypercalcemia, persistent    Hyperlipidemia    Hypertension    Hypothyroidism    Multinodular goiter (nontoxic) 08/29/2016   Multiple sclerosis ([place])    Multiple sclerosis, relapsing-remitting ([place]) 09/10/2013   Personal history of radiation therapy    Pure hypercholesterolemia 11/09/2014   Thyroid enlargement 11/09/2014   Right sided thyroid enlargement     Past Surgical History:  Procedure Laterality Date   BREAST BIOPSY Right 03/12/2019   atypical ductal hyperplasia complex sclerosing lesion   BREAST LUMPECTOMY  2007   left, radiation   MM BREAST STEREO BX*L*R/S Right 03/12/2019   RIGHT BREAST STEREOTACTIC CORE NEEDLE BIOPSY   TUBAL LIGATION  1985   Patient Active Problem List   Diagnosis Date Noted   Hyperextension injury of knee, right, initial encounter 10/24/2022   At risk for diabetes mellitus 05/25/2021   Primary hyperparathyroidism ([place]) 04/10/2020   Gait abnormality 03/14/2020   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   Chronic pain of right knee 07/16/2017   Multinodular goiter (nontoxic) 08/29/2016   Gastroesophageal reflux disease 08/29/2016   Essential hypertension 11/09/2014   Hypothyroidism 11/09/2014  Pure hypercholesterolemia 11/09/2014   Tubular adenoma of colon 12/30/2012   Multiple sclerosis, relapsing-remitting ([place]) 12/31/1999    ONSET DATE: 10/24/2022 referral  REFERRING DIAG: M25.561,G89.29 (ICD-10-CM) - Chronic pain of right knee S89.81XA (ICD-10-CM) - Hyperextension injury of knee, right, initial encounter G35 (ICD-10-CM) - Multiple sclerosis, relapsing-remitting ([place])  THERAPY DIAG:  Difficulty in walking, not elsewhere  classified  Other abnormalities of gait and mobility  Muscle weakness (generalized)  Other lack of coordination  Rationale for Evaluation and Treatment: Rehabilitation  SUBJECTIVE:                                                                                                                                                                                             SUBJECTIVE STATEMENT:  My calf is tight and sore in that R ankle, I'm stiff from sitting in the waiting room and the cold weather too. Balance is a problem for me, Sometimes I lose my balance kicking my rollator by accident.   Pt accompanied by: self  PERTINENT HISTORY: RR MS, HLD, HTN  PAIN:  Are you having pain? Yes: NPRS scale: "I don't know maybe 2-3"/10 Pain location: R ankle  Pain description: tightness and soreness  Aggravating factors: more stiff from not moving first thing in the morning Relieving factors: medication  PRECAUTIONS: Fall  WEIGHT BEARING RESTRICTIONS: No  PATIENT GOALS: "to strengthen my leg and see if a brace will help me"     TODAY'S TREATMENT:                                                                                                                                11/25/22  TherEx  Bridges with yellow TB x10 2 second holds/slow lower  Supine clams yellow TB x20  STS with yellow band above knees yellow TB x10 Max cues for form  Hip hikes x10 B   NMR  Static standing with knees slightly bent/without retrograde extension 4x20 seconds Static standing with one foot forward 3x20 seconds B solid surface Static standing EC solid surface 3x20 seconds without B  knee retrograde extension   PATIENT EDUCATION: Education details: exercise/form and purpose, have son or daughter present for balance practice at home  Person educated: Patient Education method: Explanation Education comprehension: verbalized understanding  HOME EXERCISE PROGRAM: Access Code: [email or web address] URL:  [email or web address] Date: 11/13/2022 Prepared by: Estevan Ryder  Exercises - Supine Bridge  - 1 x daily - 7 x weekly - 3 sets - 10 reps - Supine Active Straight Leg Raise  - 1 x daily - 7 x weekly - 3 sets - 10 reps - Sidelying Hip Abduction  - 1 x daily - 7 x weekly - 3 sets - 10 reps - Mini Squat with Counter Support  - 1 x daily - 7 x weekly - 3 sets - 10 reps  GOALS: Goals reviewed with patient? Yes  SHORT TERM GOALS: = LTG based on POC length  LONG TERM GOALS: Target date: 12/02/2022  Pt will be independent with final HEP for improved balance and strength  Baseline:  to be provided Goal status: INITIAL  2.  Pt will improve 5x STS to </= 12 sec to demo improved functional LE strength and balance   Baseline: 15 with B UE Goal status: INITIAL  3.  Pt will improve TUG to </= 14 secs to demonstrated reduced fall risk  Baseline: 19.4s with Rollator Goal status: INITIAL  4.  Pt will improve gait speed to >/= .63ms to demonstrate improved community ambulation  Baseline: .462m Goal status: INITIAL    ASSESSMENT:  CLINICAL IMPRESSION:  Azlee arrives today doing OK, did need a lot of encouragement for challenging activities this morning- at one point stated "I'm just here for that brace". Continued working on strength and balance as able and tolerated, needed heavy cues and manual facilitation for food form. Spent a lot of time working on actively reducing knee hyperextension with standing tasks today, also worked on safety with transfers with rollator. Will continue efforts.   OBJECTIVE IMPAIRMENTS: Abnormal gait, decreased balance, decreased coordination, decreased knowledge of condition, decreased knowledge of use of DME, difficulty walking, decreased strength, and pain.   ACTIVITY LIMITATIONS: carrying, standing, squatting, stairs, transfers, hygiene/grooming, locomotion level, and caring for others  PARTICIPATION LIMITATIONS: cleaning, interpersonal  relationship, driving, shopping, community activity, occupation, and yard work  PERSONAL FACTORS: Fitness, Past/current experiences, Sex, Time since onset of injury/illness/exacerbation, and 3+ comorbidities: RR MS, HTN, HLD  are also affecting patient's functional outcome.   REHAB POTENTIAL: Fair time since onset  CLINICAL DECISION MAKING: Evolving/moderate complexity  EVALUATION COMPLEXITY: Moderate  PLAN:  PT FREQUENCY: 2x/week  PT DURATION: 4 weeks  PLANNED INTERVENTIONS: Therapeutic exercises, Therapeutic activity, Neuromuscular re-education, Balance training, Gait training, Patient/Family education, Self Care, Joint mobilization, Stair training, Vestibular training, Visual/preceptual remediation/compensation, Orthotic/Fit training, DME instructions, Aquatic Therapy, Dry Needling, Electrical stimulation, Taping, Manual therapy, and Re-evaluation  PLAN FOR NEXT SESSION: did she bring sneakers? Trial heel wedges/AFOs, swedish knee cage that we have is too small for her extent of genu valgus    Braulio Kiedrowski U PT DPT PN2  11/25/2022, 10:56 AM   11/25/2022, 10:56 AM

## 2022-11-26 ENCOUNTER — Other Ambulatory Visit: Payer: Medicare HMO

## 2022-11-27 ENCOUNTER — Ambulatory Visit: Payer: Medicare HMO | Admitting: Physical Therapy

## 2022-11-27 DIAGNOSIS — R2689 Other abnormalities of gait and mobility: Secondary | ICD-10-CM | POA: Diagnosis not present

## 2022-11-27 DIAGNOSIS — M6281 Muscle weakness (generalized): Secondary | ICD-10-CM | POA: Diagnosis not present

## 2022-11-27 DIAGNOSIS — R278 Other lack of coordination: Secondary | ICD-10-CM

## 2022-11-27 DIAGNOSIS — S8981XA Other specified injuries of right lower leg, initial encounter: Secondary | ICD-10-CM | POA: Diagnosis not present

## 2022-11-27 DIAGNOSIS — M25561 Pain in right knee: Secondary | ICD-10-CM | POA: Diagnosis not present

## 2022-11-27 DIAGNOSIS — G8929 Other chronic pain: Secondary | ICD-10-CM | POA: Diagnosis not present

## 2022-11-27 DIAGNOSIS — R262 Difficulty in walking, not elsewhere classified: Secondary | ICD-10-CM

## 2022-11-27 DIAGNOSIS — G35 Multiple sclerosis: Secondary | ICD-10-CM | POA: Diagnosis not present

## 2022-11-27 NOTE — Therapy (Signed)
OUTPATIENT PHYSICAL THERAPY NEURO TREATMENT   Patient Name: Mariah Jackson MRN: [id number] DOB:11/06/1961, 61 y.o., female Today's Date: 11/27/2022   PCP: McDiarmid, Blane Ohara, MD REFERRING PROVIDER: McDiarmid, Blane Ohara, MD   PT End of Session - 11/27/22 0934     Visit Number 4    Number of Visits 9    Date for PT Re-Evaluation 12/09/22    Authorization Type [employer]    PT Start Time 0930    PT Stop Time 1012    PT Time Calculation (min) 42 min    Equipment Utilized During Treatment Other (comment)   PLS AFO, Swedish knee cage   Activity Tolerance Patient tolerated treatment well    Behavior During Therapy WFL for tasks assessed/performed               Past Medical History:  Diagnosis Date   Abnormality of gait 09/10/2013   Arthritis    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   [facility] Right Breast Core Needle Biopsy 03/12/19: Pathology revealed ATYPICAL DUCTAL HYPERPLASIA ARISING IN A COMPLEX SCLEROSING LESION of the Right breast, medial, middle depth. concordant by Dr. Kristopher Oppenheim, with excision recommended.  Surgical consultation has been arranged with Dr. Erroll Luna at [facility] on March 19, 2019.  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  - SEE ONCOLOGY TABLE.   COMMENT  ONCOLOGY TABLE-BREAST, IN SITU CARCINOMA ONLY   Essential hypertension 11/09/2014   GERD (gastroesophageal reflux disease)    History of breast cancer 12/30/2005   History of breast lump/mass excision 11/09/2014   Lumpectomy 2007. Pathology report non-cancerous, fibrous tissue.     History of ductal carcinoma in situ (DCIS) of breast 03/05/2019   Excision Biopsy  2017: DUCTAL CARCINOMA IN SITU, INTERMEDIATE GRADE WITH FOCAL  NECROSIS, 2 CM. Estrogen receptor positive  Progesterone receptor positive  8. TNM: pTispNXpMX   History of ductal carcinoma in situ (DCIS) of breast 03/05/2019   Excision Biopsy 2017: DUCTAL CARCINOMA IN SITU, INTERMEDIATE GRADE WITH FOCAL  NECROSIS, 2 CM. Estrogen receptor positive  Progesterone receptor positive  8. TNM: pTispNXpMX   Hypercalcemia, persistent    Hyperlipidemia    Hypertension    Hypothyroidism    Multinodular goiter (nontoxic) 08/29/2016   Multiple sclerosis ([place])    Multiple sclerosis, relapsing-remitting ([place]) 09/10/2013   Personal history of radiation therapy    Pure hypercholesterolemia 11/09/2014   Thyroid enlargement 11/09/2014   Right sided thyroid enlargement     Past Surgical History:  Procedure Laterality Date   BREAST BIOPSY Right 03/12/2019   atypical ductal hyperplasia complex sclerosing lesion   BREAST LUMPECTOMY  2007   left, radiation   MM BREAST STEREO BX*L*R/S Right 03/12/2019   RIGHT BREAST STEREOTACTIC CORE NEEDLE BIOPSY   TUBAL LIGATION  1985   Patient Active Problem List   Diagnosis Date Noted   Hyperextension injury of knee, right, initial encounter 10/24/2022   At risk for diabetes mellitus 05/25/2021   Primary hyperparathyroidism ([place]) 04/10/2020   Gait abnormality 03/14/2020   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   Chronic pain of right knee 07/16/2017   Multinodular goiter (nontoxic) 08/29/2016  Gastroesophageal reflux disease 08/29/2016   Essential hypertension 11/09/2014   Hypothyroidism 11/09/2014   Pure hypercholesterolemia 11/09/2014   Tubular adenoma of colon 12/30/2012   Multiple sclerosis, relapsing-remitting ([place]) 12/31/1999    ONSET DATE: 10/24/2022 referral  REFERRING DIAG: M25.561,G89.29 (ICD-10-CM) - Chronic pain of right knee S89.81XA (ICD-10-CM) - Hyperextension injury of knee, right, initial encounter G35 (ICD-10-CM) - Multiple  sclerosis, relapsing-remitting ([place])  THERAPY DIAG:  Difficulty in walking, not elsewhere classified  Other abnormalities of gait and mobility  Muscle weakness (generalized)  Other lack of coordination  Rationale for Evaluation and Treatment: Rehabilitation  SUBJECTIVE:                                                                                                                                                                                             SUBJECTIVE STATEMENT: Pt reports no acute changes since last session, no falls. Pt reports some soreness in her thighs after last session but no pain. No complaints of pain today.  Pt accompanied by: self  PERTINENT HISTORY: RR MS, HLD, HTN  PAIN:  Are you having pain? No  PRECAUTIONS: Fall  WEIGHT BEARING RESTRICTIONS: No  PATIENT GOALS: "to strengthen my leg and see if a brace will help me"     TODAY'S TREATMENT:                                                                                                                               THER EX  Standing RLE TKE 3 x 15 reps with red theraband with focus on slow, controlled movements   GAIT/ORTHOTIC ASSESSMENT  Gait pattern:  R hip ER, decreased hip/knee flexion- Right, decreased ankle dorsiflexion- Right, and genu recurvatum- Right Distance walked: 115 ft Assistive device utilized: Walker - 4 wheeled Level of assistance: Modified independence Comments: wearing R ASO  Gait pattern:  R hip ER increased, decreased hip/knee flexion- Right, and genu recurvatum- Right Distance walked: 115 ft Assistive device utilized: Walker - 4 wheeled Level of assistance: Modified independence Comments: trial gait with R PLS AFO, improved R ankle  DF but increased ER of hip causing LE to run into rollator more so than without brace  Attempted to add in heel wedge with use of R PLS, due to types of shoes pt is wearing her foot does not stay in the shoe as there is not enough room,  unable to accurately assess change with addition of heel wedge. Additionally, unable to trial foot-up brace due to types of shoes pt wearing and due to condition of laces (would be unable to relace shoes). Pt encouraged to bring lace-up tennis shoes to next session if possible.  Gait pattern:  R hip ER and decreased ankle dorsiflexion- Right Distance walked: 115 ft Assistive device utilized: Walker - 4 wheeled Level of assistance: Modified independence Comments: trial gait with Swedish knee cage, improvement in genu recurvatum though does continue to have some even with brace  PATIENT EDUCATION: Education details: continue HEP with slow, controlled movements; process for obtaining a R knee brace Person educated: Patient Education method: Explanation Education comprehension: verbalized understanding  HOME EXERCISE PROGRAM: Access Code: [email or web address] URL: [email or web address] Date: 11/13/2022 Prepared by: Estevan Ryder  Exercises - Supine Bridge  - 1 x daily - 7 x weekly - 3 sets - 10 reps - Supine Active Straight Leg Raise  - 1 x daily - 7 x weekly - 3 sets - 10 reps - Sidelying Hip Abduction  - 1 x daily - 7 x weekly - 3 sets - 10 reps - Mini Squat with Counter Support  - 1 x daily - 7 x weekly - 3 sets - 10 reps  GOALS: Goals reviewed with patient? Yes  SHORT TERM GOALS: = LTG based on POC length  LONG TERM GOALS: Target date: 12/02/2022  Pt will be independent with final HEP for improved balance and strength  Baseline:  to be provided Goal status: INITIAL  2.  Pt will improve 5x STS to </= 12 sec to demo improved functional LE strength and balance   Baseline: 15 with B UE Goal status: INITIAL  3.  Pt will improve TUG to </= 14 secs to demonstrated reduced fall risk  Baseline: 19.4s with Rollator Goal status: INITIAL  4.  Pt will improve gait speed to >/= .71ms to demonstrate improved community ambulation  Baseline: .424m Goal status:  INITIAL    ASSESSMENT:  CLINICAL IMPRESSION: Emphasis of skilled PT session on trialing various bracing options for improved R knee control as well as improving DF during gait. Pt exhibits most benefit from use of Swedish knee cage this date though she does continue to exhibit some genu recurvatum even with use of brace. Will further assess bracing needs in future sessions if pt brings in lace-up tennis shoes so a greater variety of braces can be trialed. Pt continues to benefit from skilled therapy services to address ongoing RLE weakness leading to impaired balance and gait deviations as noted above. Pt also continues to benefit from education regarding her MS diagnosis as well as purpose of and correct performance of exercises. Continue POC.   OBJECTIVE IMPAIRMENTS: Abnormal gait, decreased balance, decreased coordination, decreased knowledge of condition, decreased knowledge of use of DME, difficulty walking, decreased strength, and pain.   ACTIVITY LIMITATIONS: carrying, standing, squatting, stairs, transfers, hygiene/grooming, locomotion level, and caring for others  PARTICIPATION LIMITATIONS: cleaning, interpersonal relationship, driving, shopping, community activity, occupation, and yard work  PERSONAL FACTORS: Fitness, Past/current experiences, Sex, Time since onset of injury/illness/exacerbation, and 3+ comorbidities: RR MS, HTN, HLD  are also affecting patient's functional  outcome.   REHAB POTENTIAL: Fair time since onset  CLINICAL DECISION MAKING: Evolving/moderate complexity  EVALUATION COMPLEXITY: Moderate  PLAN:  PT FREQUENCY: 2x/week  PT DURATION: 4 weeks  PLANNED INTERVENTIONS: Therapeutic exercises, Therapeutic activity, Neuromuscular re-education, Balance training, Gait training, Patient/Family education, Self Care, Joint mobilization, Stair training, Vestibular training, Visual/preceptual remediation/compensation, Orthotic/Fit training, DME instructions, Aquatic  Therapy, Dry Needling, Electrical stimulation, Taping, Manual therapy, and Re-evaluation  PLAN FOR NEXT SESSION: did she bring lace-up sneakers? Trial heel wedges/AFOs, swedish knee cage that we have is too small for her extent of genu valgus, RLE strengthening    Excell Seltzer, PT, DPT, CSRS   11/27/2022, 10:13 AM

## 2022-12-03 ENCOUNTER — Ambulatory Visit: Payer: Medicare HMO | Attending: Family Medicine | Admitting: Physical Therapy

## 2022-12-03 DIAGNOSIS — M6281 Muscle weakness (generalized): Secondary | ICD-10-CM | POA: Insufficient documentation

## 2022-12-03 DIAGNOSIS — R2689 Other abnormalities of gait and mobility: Secondary | ICD-10-CM | POA: Insufficient documentation

## 2022-12-03 DIAGNOSIS — R262 Difficulty in walking, not elsewhere classified: Secondary | ICD-10-CM | POA: Insufficient documentation

## 2022-12-03 DIAGNOSIS — R278 Other lack of coordination: Secondary | ICD-10-CM | POA: Insufficient documentation

## 2022-12-03 NOTE — Therapy (Signed)
OUTPATIENT PHYSICAL THERAPY NEURO TREATMENT   Patient Name: Tieasha Larsen MRN: [id number] DOB:[birth year], 61 y.o., female Today's Date: 12/03/2022   PCP: McDiarmid, Blane Ohara, MD REFERRING PROVIDER: McDiarmid, Blane Ohara, MD   PT End of Session - 12/03/22 0928     Visit Number 5    Number of Visits 9    Date for PT Re-Evaluation 12/09/22    Authorization Type [employer] Medicare    PT Start Time 0927    PT Stop Time 1014    PT Time Calculation (min) 47 min    Equipment Utilized During Treatment Other (comment)   Swedish knee cage   Activity Tolerance Patient tolerated treatment well    Behavior During Therapy [facility] for tasks assessed/performed                Past Medical History:  Diagnosis Date   Abnormality of gait 09/10/2013   Arthritis    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   [facility]: Right Breast Core Needle Biopsy 03/12/19: Pathology revealed ATYPICAL DUCTAL HYPERPLASIA ARISING IN A COMPLEX SCLEROSING LESION of the Right breast, medial, middle depth. concordant by Dr. Kristopher Oppenheim, with excision recommended.  Surgical consultation has been arranged with Dr. Erroll Luna at [facility] on March 19, 2019.  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  - SEE ONCOLOGY TABLE.   COMMENT  ONCOLOGY TABLE-BREAST, IN SITU CARCINOMA ONLY   Essential hypertension 11/09/2014   GERD (gastroesophageal reflux disease)    History of breast cancer 12/30/2005   History of breast lump/mass excision 11/09/2014   Lumpectomy 2007. Pathology report non-cancerous, fibrous tissue.     History of ductal carcinoma in situ (DCIS) of breast 03/05/2019   Excision Biopsy 2017:  DUCTAL CARCINOMA IN SITU, INTERMEDIATE GRADE WITH FOCAL  NECROSIS, 2 CM. Estrogen receptor positive  Progesterone receptor positive  8. TNM: pTispNXpMX   History of ductal carcinoma in situ (DCIS) of breast 03/05/2019   Excision Biopsy 2017: DUCTAL CARCINOMA IN SITU, INTERMEDIATE GRADE WITH FOCAL  NECROSIS, 2 CM. Estrogen receptor positive  Progesterone receptor positive  8. TNM: pTispNXpMX   Hypercalcemia, persistent    Hyperlipidemia    Hypertension    Hypothyroidism    Multinodular goiter (nontoxic) 08/29/2016   Multiple sclerosis ([place])    Multiple sclerosis, relapsing-remitting ([place]) 09/10/2013   Personal history of radiation therapy    Pure hypercholesterolemia 11/09/2014   Thyroid enlargement 11/09/2014   Right sided thyroid enlargement     Past Surgical History:  Procedure Laterality Date   BREAST BIOPSY Right 03/12/2019   atypical ductal hyperplasia complex sclerosing lesion   BREAST LUMPECTOMY  2007   left, radiation   MM BREAST STEREO BX*L*R/S Right 03/12/2019   RIGHT BREAST STEREOTACTIC CORE NEEDLE BIOPSY   TUBAL LIGATION  1985   Patient Active Problem List   Diagnosis Date Noted   Hyperextension injury of knee, right, initial encounter 10/24/2022   At risk for diabetes mellitus 05/25/2021   Primary hyperparathyroidism ([place]) 04/10/2020   Gait abnormality 03/14/2020   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   Chronic pain of right knee 07/16/2017   Multinodular goiter (nontoxic) 08/29/2016  Gastroesophageal reflux disease 08/29/2016   Essential hypertension 11/09/2014   Hypothyroidism 11/09/2014   Pure hypercholesterolemia 11/09/2014   Tubular adenoma of colon 12/30/2012   Multiple sclerosis, relapsing-remitting ([place]) 12/31/1999    ONSET DATE: 10/24/2022 referral  REFERRING DIAG: M25.561,G89.29 (ICD-10-CM) - Chronic pain of right knee S89.81XA (ICD-10-CM) - Hyperextension injury of knee, right, initial encounter G35 (ICD-10-CM) - Multiple sclerosis,  relapsing-remitting ([place])  THERAPY DIAG:  Difficulty in walking, not elsewhere classified  Other abnormalities of gait and mobility  Muscle weakness (generalized)  Other lack of coordination  Rationale for Evaluation and Treatment: Rehabilitation  SUBJECTIVE:                                                                                                                                                                                             SUBJECTIVE STATEMENT: Pt reports no acute changes since last session, no falls. No pain today. Pt enters clinic with her rollator, has one instance of her RLE hitting walker while walking into therapy gym. Pt reports she felt okay after last session and that her HEP is going well.  Pt accompanied by: self  PERTINENT HISTORY: RR MS, HLD, HTN  PAIN:  Are you having pain? No  PRECAUTIONS: Fall  WEIGHT BEARING RESTRICTIONS: No  PATIENT GOALS: "to strengthen my leg and see if a brace will help me"     TODAY'S TREATMENT:                                                                                                                               THER EX  Standing BLE TKE 3 x 10 reps with red theraband with focus on slow, controlled movements. Pt needs cues to control knee movement and not hyperextend knee while performing exercise.  Added to HEP, see bolded below   GAIT/ORTHOTIC ASSESSMENT  Gait pattern: genu recurvatum- Right and genu recurvatum- Left Distance walked: 230 ft Assistive device utilized: Walker - 4 wheeled Level of assistance: Modified independence Comments: trial gait again with Swedish knee cage, improved R knee hyperextension; no ankle bracing  needed other than ASO  Gait pattern: genu recurvatum- Right and genu recurvatum- Left Distance walked: 115 ft Assistive device utilized: Walker - 4 wheeled Level of assistance: Modified independence Comments: lowered height of rollator to be more appropriate height for  patient; ongoing shoulder elevation but improved with cues; ongoing B knee genu recurvatum R>L   THER ACT: Reassessed for LTG assessment:  [facility] PT Assessment - 12/03/22 0933       Ambulation/Gait   Gait velocity 32.8 ft over 19.69 sec = 1.67 ft/sec      Standardized Balance Assessment   Standardized Balance Assessment Timed Up and Go Test;Five Times Sit to Stand    Five times sit to stand comments  19.65      Timed Up and Go Test   TUG Normal TUG    Normal TUG (seconds) 15.35             PATIENT EDUCATION: Education details: continue HEP with slow, controlled movements + added to HEP; process for obtaining a R knee brace Person educated: Patient Education method: Explanation and Handouts Education comprehension: verbalized understanding  HOME EXERCISE PROGRAM: Access Code: [email or web address] URL: [email or web address] Date: 11/13/2022 Prepared by: Estevan Ryder  Exercises - Supine Bridge  - 1 x daily - 7 x weekly - 3 sets - 10 reps - Supine Active Straight Leg Raise  - 1 x daily - 7 x weekly - 3 sets - 10 reps - Sidelying Hip Abduction  - 1 x daily - 7 x weekly - 3 sets - 10 reps - Mini Squat with Counter Support  - 1 x daily - 7 x weekly - 3 sets - 10 reps - Standing Terminal Knee Extension with Resistance  - 1 x daily - 7 x weekly - 3 sets - 10 reps  GOALS: Goals reviewed with patient? Yes  SHORT TERM GOALS: = LTG based on POC length  LONG TERM GOALS: Target date: 12/09/2022 (extended to match recert date)  Pt will be independent with final HEP for improved balance and strength  Baseline:  to be provided Goal status: IN PROGRESS  2.  Pt will improve 5x STS to </= 12 sec to demo improved functional LE strength and balance   Baseline: 15 with B UE, 19.65 sec with B UE (12/5) Goal status: IN PROGRESS  3.  Pt will improve TUG to </= 14 secs to demonstrated reduced fall risk  Baseline: 19.4s with Rollator, 15.35 sec with rollator (12/5) Goal status: IN  PROGRESS  4.  Pt will improve gait speed to >/= .83ms to demonstrate improved community ambulation  Baseline: .432m, 0.5 m/s (12/5) Goal status: IN PROGRESS    ASSESSMENT:  CLINICAL IMPRESSION: Emphasis of skilled PT session on continued trialing of various bracing options for improved R knee control during gait. Pt continues to exhibit the most benefit from use of a Swedish knee cage this date though she does continue to exhibit some genu recurvatum even with use of brace in her R knee. Pt also exhibits some genu recurvatum in her L knee, though not as severe as in her R knee. Reassessed LTG and pt is making progress towards all goals though she has not yet reached them yet. Extended goal date to match date of recert. Will further discuss bracing option with primary PT next session prior to requesting order for brace. Continue POC.   OBJECTIVE IMPAIRMENTS: Abnormal gait, decreased balance, decreased coordination, decreased knowledge of condition, decreased knowledge of use of DME, difficulty walking, decreased  strength, and pain.   ACTIVITY LIMITATIONS: carrying, standing, squatting, stairs, transfers, hygiene/grooming, locomotion level, and caring for others  PARTICIPATION LIMITATIONS: cleaning, interpersonal relationship, driving, shopping, community activity, occupation, and yard work  PERSONAL FACTORS: Fitness, Past/current experiences, Sex, Time since onset of injury/illness/exacerbation, and 3+ comorbidities: RR MS, HTN, HLD  are also affecting patient's functional outcome.   REHAB POTENTIAL: Fair time since onset  CLINICAL DECISION MAKING: Evolving/moderate complexity  EVALUATION COMPLEXITY: Moderate  PLAN:  PT FREQUENCY: 2x/week  PT DURATION: 4 weeks  PLANNED INTERVENTIONS: Therapeutic exercises, Therapeutic activity, Neuromuscular re-education, Balance training, Gait training, Patient/Family education, Self Care, Joint mobilization, Stair training, Vestibular training,  Visual/preceptual remediation/compensation, Orthotic/Fit training, DME instructions, Aquatic Therapy, Dry Needling, Electrical stimulation, Taping, Manual therapy, and Re-evaluation  PLAN FOR NEXT SESSION: ask for order for Swedish knee cage?, RLE strengthening, have her demonstrate TKE to ensure she is performing them properly at home    Excell Seltzer, PT, DPT, CSRS   12/03/2022, 10:26 AM

## 2022-12-04 ENCOUNTER — Other Ambulatory Visit: Payer: Self-pay | Admitting: Neurology

## 2022-12-04 DIAGNOSIS — G35 Multiple sclerosis: Secondary | ICD-10-CM

## 2022-12-05 ENCOUNTER — Ambulatory Visit: Payer: Medicare HMO

## 2022-12-05 ENCOUNTER — Telehealth: Payer: Self-pay

## 2022-12-05 DIAGNOSIS — M6281 Muscle weakness (generalized): Secondary | ICD-10-CM

## 2022-12-05 DIAGNOSIS — R278 Other lack of coordination: Secondary | ICD-10-CM

## 2022-12-05 DIAGNOSIS — R2689 Other abnormalities of gait and mobility: Secondary | ICD-10-CM

## 2022-12-05 DIAGNOSIS — R262 Difficulty in walking, not elsewhere classified: Secondary | ICD-10-CM | POA: Diagnosis not present

## 2022-12-05 NOTE — Therapy (Signed)
OUTPATIENT PHYSICAL THERAPY NEURO TREATMENT   Patient Name: Mariah Jackson MRN: [id number] DOB:02-25-1961, 61 y.o., female Today's Date: 12/05/2022   PCP: McDiarmid, Blane Ohara, MD REFERRING PROVIDER: McDiarmid, Blane Ohara, MD   PT End of Session - 12/05/22 0935     Visit Number 6    Number of Visits 9    Date for PT Re-Evaluation 12/09/22    Authorization Type [employer]    PT Start Time [phone]    PT Stop Time 1013    PT Time Calculation (min) 40 min    Equipment Utilized During Treatment Other (comment)   swedish knee cage   Activity Tolerance Patient tolerated treatment well    Behavior During Therapy [facility] for tasks assessed/performed            Past Medical History:  Diagnosis Date   Abnormality of gait 09/10/2013   Arthritis    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   [facility]: Right Breast Core Needle Biopsy 03/12/19: Pathology revealed ATYPICAL DUCTAL HYPERPLASIA ARISING IN A COMPLEX SCLEROSING LESION of the Right breast, medial, middle depth. concordant by Dr. Kristopher Oppenheim, with excision recommended.  Surgical consultation has been arranged with Dr. Erroll Luna at [facility] on March 19, 2019.  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  - SEE ONCOLOGY TABLE.   COMMENT  ONCOLOGY TABLE-BREAST, IN SITU CARCINOMA ONLY   Essential hypertension 11/09/2014   GERD (gastroesophageal reflux disease)    History of breast cancer 12/30/2005   History of breast lump/mass excision 11/09/2014   Lumpectomy 2007. Pathology report non-cancerous, fibrous tissue.     History of ductal carcinoma in situ (DCIS) of breast 03/05/2019   Excision Biopsy 2017: DUCTAL  CARCINOMA IN SITU, INTERMEDIATE GRADE WITH FOCAL  NECROSIS, 2 CM. Estrogen receptor positive  Progesterone receptor positive  8. TNM: pTispNXpMX   History of ductal carcinoma in situ (DCIS) of breast 03/05/2019   Excision Biopsy 2017: DUCTAL CARCINOMA IN SITU, INTERMEDIATE GRADE WITH FOCAL  NECROSIS, 2 CM. Estrogen receptor positive  Progesterone receptor positive  8. TNM: pTispNXpMX   Hypercalcemia, persistent    Hyperlipidemia    Hypertension    Hypothyroidism    Multinodular goiter (nontoxic) 08/29/2016   Multiple sclerosis ([place])    Multiple sclerosis, relapsing-remitting ([place]) 09/10/2013   Personal history of radiation therapy    Pure hypercholesterolemia 11/09/2014   Thyroid enlargement 11/09/2014   Right sided thyroid enlargement     Past Surgical History:  Procedure Laterality Date   BREAST BIOPSY Right 03/12/2019   atypical ductal hyperplasia complex sclerosing lesion   BREAST LUMPECTOMY  2007   left, radiation   MM BREAST STEREO BX*L*R/S Right 03/12/2019   RIGHT BREAST STEREOTACTIC CORE NEEDLE BIOPSY   TUBAL LIGATION  1985   Patient Active Problem List   Diagnosis Date Noted   Hyperextension injury of knee, right, initial encounter 10/24/2022   At risk for diabetes mellitus 05/25/2021   Primary hyperparathyroidism ([place]) 04/10/2020   Gait abnormality 03/14/2020   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   Chronic pain of right knee 07/16/2017   Multinodular goiter (nontoxic) 08/29/2016   Gastroesophageal reflux disease  08/29/2016   Essential hypertension 11/09/2014   Hypothyroidism 11/09/2014   Pure hypercholesterolemia 11/09/2014   Tubular adenoma of colon 12/30/2012   Multiple sclerosis, relapsing-remitting ([place]) 12/31/1999    ONSET DATE: 10/24/2022 referral  REFERRING DIAG: M25.561,G89.29 (ICD-10-CM) - Chronic pain of right knee S89.81XA (ICD-10-CM) - Hyperextension injury of knee, right, initial encounter G35 (ICD-10-CM) - Multiple sclerosis,  relapsing-remitting ([place])  THERAPY DIAG:  Difficulty in walking, not elsewhere classified  Muscle weakness (generalized)  Other abnormalities of gait and mobility  Other lack of coordination  Rationale for Evaluation and Treatment: Rehabilitation  SUBJECTIVE:                                                                                                                                                                                             SUBJECTIVE STATEMENT: Patient reports doing well- a little sore at times. Reports swedish knee cage has been helping a little. Denies falls/near falls.   Pt accompanied by: self  PERTINENT HISTORY: RR MS, HLD, HTN  PAIN:  Are you having pain? No  PRECAUTIONS: Fall  WEIGHT BEARING RESTRICTIONS: No  PATIENT GOALS: "to strengthen my leg and see if a brace will help me"     TODAY'S TREATMENT:                                                                                                                               THER EX -[place] level 3 B UE/LE x 10 mins  GAIT:  - 115' x2 R LE swedish knee cage (adjusted to minimize R genu recurvatum, but not force too much knee flexion)   PATIENT EDUCATION: Education details: continue HEP, gait with knee cage  Person educated: Patient Education method: Explanation and Handouts Education comprehension: verbalized understanding  HOME EXERCISE PROGRAM: Access Code: [email or web address] URL: [email or web address] Date: 11/13/2022 Prepared by: Estevan Ryder  Exercises - Supine Bridge  - 1 x daily - 7 x weekly - 3 sets - 10 reps - Supine Active Straight Leg Raise  - 1 x daily - 7 x weekly - 3 sets -  10 reps - Sidelying Hip Abduction  - 1 x daily - 7 x weekly - 3 sets - 10 reps - Mini Squat with Counter Support  - 1 x daily - 7 x weekly - 3 sets - 10 reps - Standing Terminal Knee Extension with Resistance  - 1 x daily - 7 x weekly - 3 sets - 10 reps  GOALS: Goals reviewed with  patient? Yes  SHORT TERM GOALS: = LTG based on POC length  LONG TERM GOALS: Target date: 12/09/2022 (extended to match recert date)  Pt will be independent with final HEP for improved balance and strength  Baseline:  to be provided Goal status: IN PROGRESS  2.  Pt will improve 5x STS to </= 12 sec to demo improved functional LE strength and balance   Baseline: 15 with B UE, 19.65 sec with B UE (12/5) Goal status: IN PROGRESS  3.  Pt will improve TUG to </= 14 secs to demonstrated reduced fall risk  Baseline: 19.4s with Rollator, 15.35 sec with rollator (12/5) Goal status: IN PROGRESS  4.  Pt will improve gait speed to >/= .34ms to demonstrate improved community ambulation  Baseline: .415m, 0.5 m/s (12/5) Goal status: IN PROGRESS    ASSESSMENT:  CLINICAL IMPRESSION: Patient seen for skilled PT session with emphasis on functional LE strengthening and trialing gait with swedish knee cage again. Patient with initial difficulty ambulating with tighter setting of swedish knee cage because it forced too much knee flexion during stance phase and R quads lack strength to maintain stability at this time. PT loosened settings and patient tolerated much better. She would benefit from B Swedish knee cages to minimize genu recurvatum and improve overall mechanics of her gait. Continue POC.    OBJECTIVE IMPAIRMENTS: Abnormal gait, decreased balance, decreased coordination, decreased knowledge of condition, decreased knowledge of use of DME, difficulty walking, decreased strength, and pain.   ACTIVITY LIMITATIONS: carrying, standing, squatting, stairs, transfers, hygiene/grooming, locomotion level, and caring for others  PARTICIPATION LIMITATIONS: cleaning, interpersonal relationship, driving, shopping, community activity, occupation, and yard work  PERSONAL FACTORS: Fitness, Past/current experiences, Sex, Time since onset of injury/illness/exacerbation, and 3+ comorbidities: RR MS, HTN, HLD   are also affecting patient's functional outcome.   REHAB POTENTIAL: Fair time since onset  CLINICAL DECISION MAKING: Evolving/moderate complexity  EVALUATION COMPLEXITY: Moderate  PLAN:  PT FREQUENCY: 2x/week  PT DURATION: 4 weeks  PLANNED INTERVENTIONS: Therapeutic exercises, Therapeutic activity, Neuromuscular re-education, Balance training, Gait training, Patient/Family education, Self Care, Joint mobilization, Stair training, Vestibular training, Visual/preceptual remediation/compensation, Orthotic/Fit training, DME instructions, Aquatic Therapy, Dry Needling, Electrical stimulation, Taping, Manual therapy, and Re-evaluation  PLAN FOR NEXT SESSION: RLE strengthening, have her demonstrate TKE to ensure she is performing them properly at home   JeDebbora DusPT, DPT, CBIS 12/05/2022, 10:15 AM

## 2022-12-05 NOTE — Telephone Encounter (Signed)
Dr. Wendy Poet,  Mariah Jackson is being treated by physical therapy for gait impairments related to her MS.  She will benefit from use of bilateral swedish knee cages in order to improve safety with functional mobility.    If you agree, please submit request in EPIC under MD Order, Other Orders (list B swedish knee cages in comments) or fax to [facility] at [phone].   Thank you, Debbora Dus, PT, DPT, [facility] [address] [place], [place]  [address] Phone:  [phone] Fax:  [phone]

## 2022-12-06 ENCOUNTER — Encounter: Payer: Self-pay | Admitting: Family Medicine

## 2022-12-06 ENCOUNTER — Other Ambulatory Visit: Payer: Self-pay | Admitting: Family Medicine

## 2022-12-06 DIAGNOSIS — R269 Unspecified abnormalities of gait and mobility: Secondary | ICD-10-CM

## 2022-12-06 DIAGNOSIS — G8929 Other chronic pain: Secondary | ICD-10-CM

## 2022-12-06 DIAGNOSIS — M21861 Other specified acquired deformities of right lower leg: Secondary | ICD-10-CM

## 2022-12-06 DIAGNOSIS — S8981XA Other specified injuries of right lower leg, initial encounter: Secondary | ICD-10-CM

## 2022-12-06 HISTORY — DX: Other specified acquired deformities of right lower leg: M21.861

## 2022-12-06 NOTE — Progress Notes (Signed)
Prescription for right knee genu recuvatum from multiple sclerosis

## 2022-12-09 ENCOUNTER — Ambulatory Visit: Payer: Medicare HMO | Admitting: Physical Therapy

## 2022-12-09 ENCOUNTER — Encounter: Payer: Self-pay | Admitting: Physical Therapy

## 2022-12-09 DIAGNOSIS — R262 Difficulty in walking, not elsewhere classified: Secondary | ICD-10-CM

## 2022-12-09 DIAGNOSIS — R2689 Other abnormalities of gait and mobility: Secondary | ICD-10-CM | POA: Diagnosis not present

## 2022-12-09 DIAGNOSIS — M6281 Muscle weakness (generalized): Secondary | ICD-10-CM

## 2022-12-09 DIAGNOSIS — R278 Other lack of coordination: Secondary | ICD-10-CM | POA: Diagnosis not present

## 2022-12-09 NOTE — Therapy (Signed)
OUTPATIENT PHYSICAL THERAPY NEURO TREATMENT   Patient Name: Mariah Jackson MRN: [id number] DOB:[birth year], 61 y.o., female Today's Date: 12/09/2022   PCP: McDiarmid, Blane Ohara, MD REFERRING PROVIDER: McDiarmid, Blane Ohara, MD   PT End of Session - 12/09/22 0847     Visit Number 7    Number of Visits 9    Date for PT Re-Evaluation 12/09/22    Authorization Type [employer] Medicare    PT Start Time [phone]    PT Stop Time 0927    PT Time Calculation (min) 41 min    Equipment Utilized During Treatment --    Activity Tolerance Patient tolerated treatment well    Behavior During Therapy [facility] for tasks assessed/performed             Past Medical History:  Diagnosis Date   Abnormality of gait 09/10/2013   Arthritis    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   [facility]: Right Breast Core Needle Biopsy 03/12/19: Pathology revealed ATYPICAL DUCTAL HYPERPLASIA ARISING IN A COMPLEX SCLEROSING LESION of the Right breast, medial, middle depth. concordant by Dr. Kristopher Oppenheim, with excision recommended.  Surgical consultation has been arranged with Dr. Erroll Luna at [facility] on March 19, 2019.  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  - SEE ONCOLOGY TABLE.   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 Pathology report non-cancerous, fibrous tissue.     History of ductal carcinoma in situ (DCIS) of breast 03/05/2019   Excision  Biopsy 2017: DUCTAL CARCINOMA IN SITU, INTERMEDIATE GRADE WITH FOCAL  NECROSIS, 2 CM. Estrogen receptor positive  Progesterone receptor positive  8. TNM: pTispNXpMX   History of ductal carcinoma in situ (DCIS) of breast 03/05/2019   Excision Biopsy 2017: DUCTAL CARCINOMA IN SITU, INTERMEDIATE GRADE WITH FOCAL  NECROSIS, 2 CM. Estrogen receptor positive  Progesterone receptor positive  8. TNM: pTispNXpMX   Hypercalcemia, persistent    Hyperlipidemia    Hypertension    Hypothyroidism    Multinodular goiter (nontoxic) 08/29/2016   Multiple sclerosis ([place])    Multiple sclerosis, relapsing-remitting ([place]) 09/10/2013   Personal history of radiation therapy    Pure hypercholesterolemia 11/09/2014   Thyroid enlargement 11/09/2014   Right sided thyroid enlargement     Past Surgical History:  Procedure Laterality Date   BREAST BIOPSY Right 03/12/2019   atypical ductal hyperplasia complex sclerosing lesion   BREAST LUMPECTOMY  2007   left, radiation   MM BREAST STEREO BX*L*R/S Right 03/12/2019   RIGHT BREAST STEREOTACTIC CORE NEEDLE BIOPSY   TUBAL LIGATION  1985   Patient Active Problem List   Diagnosis Date Noted   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   Hyperextension injury of knee, right, initial encounter 10/24/2022   At risk for diabetes mellitus 05/25/2021   Primary hyperparathyroidism ([place]) 04/10/2020   Gait abnormality 03/14/2020   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   Chronic pain of right knee  07/16/2017   Multinodular goiter (nontoxic) 08/29/2016   Gastroesophageal reflux disease 08/29/2016   Essential hypertension 11/09/2014   Hypothyroidism 11/09/2014   Pure hypercholesterolemia 11/09/2014   Tubular adenoma of colon 12/30/2012   Multiple sclerosis, relapsing-remitting ([place]) 12/31/1999    ONSET DATE: 10/24/2022 referral  REFERRING DIAG: M25.561,G89.29 (ICD-10-CM) - Chronic pain of right knee S89.81XA (ICD-10-CM) - Hyperextension injury of knee,  right, initial encounter G35 (ICD-10-CM) - Multiple sclerosis, relapsing-remitting ([place])  THERAPY DIAG:  Difficulty in walking, not elsewhere classified  Muscle weakness (generalized)  Other abnormalities of gait and mobility  Rationale for Evaluation and Treatment: Rehabilitation  SUBJECTIVE:                                                                                                                                                                                             SUBJECTIVE STATEMENT: No changes since she was last here, has been doing her exercises. Didn't bring her sneakers today.   Pt accompanied by: self  PERTINENT HISTORY: RR MS, HLD, HTN  PAIN:  Are you having pain? No  PRECAUTIONS: Fall  WEIGHT BEARING RESTRICTIONS: No  PATIENT GOALS: "to strengthen my leg and see if a brace will help me"    TODAY'S TREATMENT:                                                                                                                               THER EX SciFit Multi-Peaks at Gear 3.0 with BLE/BUE for strengthening, ROM, activity tolerance.   2 sets of 10 reps bridges with red tband around thighs, cues for hip ABD before lifting up, cued for slowed and controlled pace, PT needing to help pt stabilize LLE at times  2 sets of 10 reps clamshells bilat with use of red tband, with verbal/demo cues for proper technique, pt had more difficulty with LLE  Standing BLE TKE 2 x 10 reps with red theraband with focus on slow, controlled movements. Pt needs cues to control knee movement. Reviewed how to perform at home, either with a longer band she can  tie it around the railing (has a chair behind her) or when her son is around, then he can hold onto it.  Sit to stands x5 reps with BUE support to stand and sit, focus on eccentric control when lowering, pt reporting incr pain in R knee Seated marching with use of red tband around thighs 2 sets of 5 reps bilat, cues for slowed and  controlled.     PATIENT EDUCATION: Education details: continue HEP,  wearing sneakers to next session to trial foot up brace  Person educated: Patient Education method: Explanation Education comprehension: verbalized understanding  HOME EXERCISE PROGRAM: Access Code: [email or web address] URL: [email or web address] Date: 11/13/2022 Prepared by: Estevan Ryder  Exercises - Supine Bridge  - 1 x daily - 7 x weekly - 3 sets - 10 reps - Supine Active Straight Leg Raise  - 1 x daily - 7 x weekly - 3 sets - 10 reps - Sidelying Hip Abduction  - 1 x daily - 7 x weekly - 3 sets - 10 reps - Mini Squat with Counter Support  - 1 x daily - 7 x weekly - 3 sets - 10 reps - Standing Terminal Knee Extension with Resistance  - 1 x daily - 7 x weekly - 3 sets - 10 reps  GOALS: Goals reviewed with patient? Yes  SHORT TERM GOALS: = LTG based on POC length  LONG TERM GOALS: Target date: 12/09/2022 (extended to match recert date)  Pt will be independent with final HEP for improved balance and strength  Baseline:  to be provided Goal status: IN PROGRESS  2.  Pt will improve 5x STS to </= 12 sec to demo improved functional LE strength and balance   Baseline: 15 with B UE, 19.65 sec with B UE (12/5) Goal status: IN PROGRESS  3.  Pt will improve TUG to </= 14 secs to demonstrated reduced fall risk  Baseline: 19.4s with Rollator, 15.35 sec with rollator (12/5) Goal status: IN PROGRESS  4.  Pt will improve gait speed to >/= .27ms to demonstrate improved community ambulation  Baseline: .438m, 0.5 m/s (12/5) Goal status: IN PROGRESS    ASSESSMENT:  CLINICAL IMPRESSION:  Today's skilled session focused on BLE strengthening with focus on quads and hip ABD. Pt needing cues for proper technique of exercises and to slow pace down. PT needing to help stabilize LLE during bridging. Pt did not wear lace-up tennis shoes today, so unable to trial foot up brace with heel wedge to help with foot  clearance/knee control. Educated patient to wear this to next session to trial. Will continue per POC.     OBJECTIVE IMPAIRMENTS: Abnormal gait, decreased balance, decreased coordination, decreased knowledge of condition, decreased knowledge of use of DME, difficulty walking, decreased strength, and pain.   ACTIVITY LIMITATIONS: carrying, standing, squatting, stairs, transfers, hygiene/grooming, locomotion level, and caring for others  PARTICIPATION LIMITATIONS: cleaning, interpersonal relationship, driving, shopping, community activity, occupation, and yard work  PERSONAL FACTORS: Fitness, Past/current experiences, Sex, Time since onset of injury/illness/exacerbation, and 3+ comorbidities: RR MS, HTN, HLD  are also affecting patient's functional outcome.   REHAB POTENTIAL: Fair time since onset  CLINICAL DECISION MAKING: Evolving/moderate complexity  EVALUATION COMPLEXITY: Moderate  PLAN:  PT FREQUENCY: 2x/week  PT DURATION: 4 weeks  PLANNED INTERVENTIONS: Therapeutic exercises, Therapeutic activity, Neuromuscular re-education, Balance training, Gait training, Patient/Family education, Self Care, Joint mobilization, Stair training, Vestibular training, Visual/preceptual remediation/compensation, Orthotic/Fit training, DME instructions, Aquatic Therapy, Dry Needling, Electrical stimulation, Taping, Manual therapy, and Re-evaluation  PLAN FOR NEXT SESSION: RLE strengthening, have her demonstrate TKE to ensure she is performing them properly at home. Did pt bring her sneakers? This is pt's last visit - add more or D/C?   Janann August, PT, DPT 12/09/22 10:29 AM

## 2022-12-11 ENCOUNTER — Ambulatory Visit: Payer: Medicare HMO

## 2022-12-11 DIAGNOSIS — M6281 Muscle weakness (generalized): Secondary | ICD-10-CM

## 2022-12-11 DIAGNOSIS — R2689 Other abnormalities of gait and mobility: Secondary | ICD-10-CM

## 2022-12-11 DIAGNOSIS — R262 Difficulty in walking, not elsewhere classified: Secondary | ICD-10-CM | POA: Diagnosis not present

## 2022-12-11 DIAGNOSIS — R278 Other lack of coordination: Secondary | ICD-10-CM | POA: Diagnosis not present

## 2022-12-11 NOTE — Therapy (Signed)
OUTPATIENT PHYSICAL THERAPY NEURO TREATMENT/ RE-CERT/ DISCHARGE SUMMARY   Patient Name: Mariah Jackson MRN: [id number] DOB:June 13, 1961, 61 y.o., female Today's Date: 12/11/2022   PCP: McDiarmid, Blane Ohara, MD REFERRING PROVIDER: McDiarmid, Blane Ohara, MD  PHYSICAL THERAPY DISCHARGE SUMMARY  Visits from Start of Care: 8  Current functional level related to goals / functional outcomes: See below    Remaining deficits: See below    Education / Equipment: PT POC, HEP, use of swedish knee cage   Patient agrees to discharge. Patient goals were met. Patient is being discharged due to meeting the stated rehab goals.   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 concordant by Dr. Kristopher Oppenheim, with excision recommended.  Surgical consultation has been arranged with Dr. Erroll Luna at [facility] on March 19, 2019.  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  - SEE ONCOLOGY TABLE.   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 Pathology report non-cancerous, fibrous tissue.     History of ductal carcinoma in situ (DCIS) of breast 03/05/2019   Excision Biopsy 2017: DUCTAL CARCINOMA IN SITU, INTERMEDIATE GRADE WITH FOCAL  NECROSIS, 2 CM. Estrogen receptor positive  Progesterone receptor positive  8. TNM: pTispNXpMX   History of ductal carcinoma in situ (DCIS) of breast 03/05/2019   Excision Biopsy 2017: DUCTAL CARCINOMA IN SITU, INTERMEDIATE GRADE WITH FOCAL  NECROSIS, 2 CM. Estrogen receptor positive  Progesterone receptor positive  8. TNM: pTispNXpMX   Hypercalcemia, persistent    Hyperlipidemia    Hypertension    Hypothyroidism    Multinodular goiter (nontoxic) 08/29/2016   Multiple sclerosis ([place])    Multiple sclerosis, relapsing-remitting ([place]) 09/10/2013   Personal history of radiation therapy    Pure hypercholesterolemia 11/09/2014   Thyroid enlargement 11/09/2014   Right sided thyroid enlargement     Past Surgical History:  Procedure Laterality Date   BREAST BIOPSY Right 03/12/2019   atypical ductal hyperplasia complex sclerosing lesion   BREAST LUMPECTOMY  2007   left, radiation   MM BREAST STEREO BX*L*R/S Right 03/12/2019   RIGHT BREAST STEREOTACTIC CORE NEEDLE BIOPSY   TUBAL LIGATION  1985  Patient Active Problem List   Diagnosis Date Noted   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   Hyperextension injury of knee, right, initial encounter 10/24/2022   At risk for diabetes mellitus 05/25/2021   Primary hyperparathyroidism ([place]) 04/10/2020   Gait abnormality 03/14/2020   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   Chronic pain of right knee 07/16/2017   Multinodular goiter (nontoxic) 08/29/2016   Gastroesophageal reflux disease 08/29/2016    Essential hypertension 11/09/2014   Hypothyroidism 11/09/2014   Pure hypercholesterolemia 11/09/2014   Tubular adenoma of colon 12/30/2012   Multiple sclerosis, relapsing-remitting ([place]) 12/31/1999    ONSET DATE: 10/24/2022 referral  REFERRING DIAG: M25.561,G89.29 (ICD-10-CM) - Chronic pain of right knee S89.81XA (ICD-10-CM) - Hyperextension injury of knee, right, initial encounter G35 (ICD-10-CM) - Multiple sclerosis, relapsing-remitting ([place])  THERAPY DIAG:  Difficulty in walking, not elsewhere classified  Muscle weakness (generalized)  Other abnormalities of gait and mobility  Other lack of coordination  Rationale for Evaluation and Treatment: Rehabilitation  SUBJECTIVE:                                                                                                                                                                                             SUBJECTIVE STATEMENT: Patient reports doing well today. Denies falls/near falls. Provided patient with [facility] contact info.   Pt accompanied by: self  PERTINENT HISTORY: RR MS, HLD, HTN  PAIN:  Are you having pain? No  PRECAUTIONS: Fall  WEIGHT BEARING RESTRICTIONS: No  PATIENT GOALS: "to strengthen my leg and see if a brace will help me"    TODAY'S TREATMENT:                                                                                                                              Theract:  - goal assessment   [facility] PT Assessment - 12/11/22 0001       Standardized Balance Assessment   Five times sit to stand comments  11.75    10 Meter Walk .23ms      Timed Up and Go  Test   Normal TUG (seconds) 13.56              PATIENT EDUCATION: Education details: exam results, PT POC Person educated: Patient Education method: Explanation Education comprehension: verbalized understanding  HOME EXERCISE PROGRAM: Access Code: [email or web address] URL: [email or web address] Date: 11/13/2022 Prepared by:  Estevan Ryder  Exercises - Supine Bridge  - 1 x daily - 7 x weekly - 3 sets - 10 reps - Supine Active Straight Leg Raise  - 1 x daily - 7 x weekly - 3 sets - 10 reps - Sidelying Hip Abduction  - 1 x daily - 7 x weekly - 3 sets - 10 reps - Mini Squat with Counter Support  - 1 x daily - 7 x weekly - 3 sets - 10 reps - Standing Terminal Knee Extension with Resistance  - 1 x daily - 7 x weekly - 3 sets - 10 reps  GOALS: Goals reviewed with patient? Yes  SHORT TERM GOALS: = LTG based on POC length  LONG TERM GOALS: Target date: 12/09/2022 (extended to match recert date)  Pt will be independent with final HEP for improved balance and strength  Baseline:  to be provided; provided Goal status: MET  2.  Pt will improve 5x STS to </= 12 sec to demo improved functional LE strength and balance   Baseline: 15 with B UE, 19.65 sec with B UE (12/5); 11.75 Goal status: MET  3.  Pt will improve TUG to </= 14 secs to demonstrated reduced fall risk  Baseline: 19.4s with Rollator, 15.35 sec with rollator (12/5); 13.56s  Goal status: MET  4.  Pt will improve gait speed to >/= .38ms to demonstrate improved community ambulation  Baseline: .437m, 0.5 m/s (12/5); .6185mGoal status: MET    ASSESSMENT:  CLINICAL IMPRESSION:  Patient seen for skilled PT session with emphasis on goal assessment and dc. She met 4/4 LTG. Five times Sit to Stand Test (FTSS) Method: Use a straight back chair with a solid seat that is 17-18" high. Ask participant to sit on the chair with arms folded across their chest.   Instructions: "Stand up and sit down as quickly as possible 5 times, keeping your arms folded across your chest."   Measurement: Stop timing when the participant touches the chair in sitting the 5th time.  TIME: 11.75 sec  Cut off scores indicative of increased fall risk: >12 sec CVA, >16 sec PD, >13 sec vestibular (ANPTA Core Set of Outcome Measures for Adults with Neurologic Conditions,  2018). 10 Meter Walk Test: Patient instructed to walk 10 meters (32.8 ft) as quickly and as safely as possible at their normal speed x2 and at a fast speed x2. Time measured from 2 meter mark to 8 meter mark to accommodate ramp-up and ramp-down.  Normal speed: .33m62mut off scores: <0.4 m/s = household Ambulator, 0.4-0.8 m/s = limited community Ambulator, >0.8 m/s = community Ambulator, >1.2 m/s = crossing a street, <1.0 = increased fall risk MCID 0.05 m/s (small), 0.13 m/s (moderate), 0.06 m/s (significant)  (ANPTA Core Set of Outcome Measures for Adults with Neurologic Conditions, 2018). Patient completed the Timed Up and Go test (TUG) in 13.56 seconds.  Geriatrics: need for further assessment of fall risk: ? 12 sec; Recurrent falls: > 15 sec; Vestibular Disorders fall risk: > 15 sec; Parkinson's Disease fall risk: > 16 sec ([email or web address]). Patient to dc from PT at this time.    OBJECTIVE IMPAIRMENTS: Abnormal gait, decreased balance, decreased coordination,  decreased knowledge of condition, decreased knowledge of use of DME, difficulty walking, decreased strength, and pain.   ACTIVITY LIMITATIONS: carrying, standing, squatting, stairs, transfers, hygiene/grooming, locomotion level, and caring for others  PARTICIPATION LIMITATIONS: cleaning, interpersonal relationship, driving, shopping, community activity, occupation, and yard work  PERSONAL FACTORS: Fitness, Past/current experiences, Sex, Time since onset of injury/illness/exacerbation, and 3+ comorbidities: RR MS, HTN, HLD  are also affecting patient's functional outcome.   REHAB POTENTIAL: Fair time since onset  CLINICAL DECISION MAKING: Evolving/moderate complexity  EVALUATION COMPLEXITY: Moderate  PLAN:  PT FREQUENCY: 2x/week  PT DURATION: 4 weeks  PLANNED INTERVENTIONS: Therapeutic exercises, Therapeutic activity, Neuromuscular re-education, Balance training, Gait training, Patient/Family education, Self Care, Joint  mobilization, Stair training, Vestibular training, Visual/preceptual remediation/compensation, Orthotic/Fit training, DME instructions, Aquatic Therapy, Dry Needling, Electrical stimulation, Taping, Manual therapy, and Re-evaluation  PLAN FOR NEXT SESSION: d/c from PT   Debbora Dus, PT, DPT, CBIS  12/11/22 9:56 AM

## 2023-01-02 ENCOUNTER — Other Ambulatory Visit: Payer: Self-pay

## 2023-01-02 DIAGNOSIS — G35 Multiple sclerosis: Secondary | ICD-10-CM

## 2023-01-02 MED ORDER — TECFIDERA 240 MG PO CPDR: DELAYED_RELEASE_CAPSULE | ORAL | 3 refills | Status: AC

## 2023-01-02 MED ORDER — DALFAMPRIDINE ER 10 MG PO TB12: 10.0000 mg | ORAL_TABLET | Freq: Two times a day (BID) | ORAL | 5 refills | Status: DC

## 2023-01-10 ENCOUNTER — Other Ambulatory Visit: Payer: Self-pay

## 2023-01-10 DIAGNOSIS — E7800 Pure hypercholesterolemia, unspecified: Secondary | ICD-10-CM

## 2023-01-10 MED ORDER — ROSUVASTATIN CALCIUM 10 MG PO TABS: 10.0000 mg | ORAL_TABLET | Freq: Every day | ORAL | 3 refills | Status: DC

## 2023-01-10 NOTE — Telephone Encounter (Signed)
Patient calls nurse line requesting a refill on Crestor.   She reports she is in the process of changing mail order pharmacies. She reports going forward she will be using [facility].   However, she reports she is almost out of Crestor and is requesting a 30 day supply to her local pharmacy.   Will forward to PCP.

## 2023-01-13 ENCOUNTER — Other Ambulatory Visit: Payer: Self-pay

## 2023-01-13 DIAGNOSIS — I10 Essential (primary) hypertension: Secondary | ICD-10-CM

## 2023-01-14 MED ORDER — AMLODIPINE BESYLATE 10 MG PO TABS: 10.0000 mg | ORAL_TABLET | Freq: Every day | ORAL | 3 refills | Status: DC

## 2023-01-15 ENCOUNTER — Telehealth: Payer: Self-pay | Admitting: Neurology

## 2023-01-15 MED ORDER — GABAPENTIN 100 MG PO CAPS: ORAL_CAPSULE | ORAL | 3 refills | Status: DC

## 2023-01-15 NOTE — Telephone Encounter (Signed)
Pt is calling. Stated she is having burning in her toes and pain. Said it mostly happening at night. Pt is requesting a call back from nurse.

## 2023-01-15 NOTE — Telephone Encounter (Signed)
Called and spoke to patient in regards to phone call from earlier today, she states that the tingling and numbness have started in the last month of so but has gradually gotten worse since she stopped PT. She states it has gotten to where it is keeping her up at night and making it harder to sleep. She would like recommendations on how to proceed, I advised that I would reach out to Judson Roch, NP for her advise. Patient agreed. Patient has follow up in June 2024.

## 2023-01-15 NOTE — Telephone Encounter (Signed)
I called the patient, reports tingling, burning to top of toes. Worse at night. No falls. Started after she stopped therapy. No swelling or obvious foot problem. No weakness or gait change. We will try some low dose gabapentin.   Meds ordered this encounter  Medications   gabapentin (NEURONTIN) 100 MG capsule    Sig: Take 1-2 capsules at bedtime as needed for nerve pain in your feet    Dispense:  60 capsule    Refill:  3

## 2023-01-17 ENCOUNTER — Other Ambulatory Visit: Payer: Self-pay

## 2023-01-17 DIAGNOSIS — I10 Essential (primary) hypertension: Secondary | ICD-10-CM

## 2023-01-17 MED ORDER — VALSARTAN 80 MG PO TABS: 80.0000 mg | ORAL_TABLET | Freq: Every day | ORAL | 3 refills | Status: DC

## 2023-01-17 MED ORDER — AMLODIPINE BESYLATE 10 MG PO TABS: 10.0000 mg | ORAL_TABLET | Freq: Every day | ORAL | 3 refills | Status: DC

## 2023-01-24 ENCOUNTER — Other Ambulatory Visit: Payer: Self-pay | Admitting: Family Medicine

## 2023-01-24 DIAGNOSIS — E7800 Pure hypercholesterolemia, unspecified: Secondary | ICD-10-CM

## 2023-01-24 DIAGNOSIS — I10 Essential (primary) hypertension: Secondary | ICD-10-CM

## 2023-01-24 MED ORDER — ROSUVASTATIN CALCIUM 10 MG PO TABS: 10.0000 mg | ORAL_TABLET | Freq: Every day | ORAL | 3 refills | Status: DC

## 2023-01-24 MED ORDER — AMLODIPINE BESYLATE 10 MG PO TABS: 10.0000 mg | ORAL_TABLET | Freq: Every day | ORAL | 3 refills | Status: DC

## 2023-01-24 MED ORDER — VALSARTAN 80 MG PO TABS: 80.0000 mg | ORAL_TABLET | Freq: Every day | ORAL | 3 refills | Status: DC

## 2023-01-29 ENCOUNTER — Other Ambulatory Visit: Payer: Self-pay | Admitting: Neurology

## 2023-02-03 ENCOUNTER — Telehealth: Payer: Self-pay | Admitting: Neurology

## 2023-02-03 NOTE — Telephone Encounter (Signed)
Debbie from [place] called stating that they have started the PA process on Cover My Meds for the pt's TECFIDERA 240 MG CPDR with KEY # [id number]

## 2023-02-04 ENCOUNTER — Telehealth: Payer: Self-pay | Admitting: Family Medicine

## 2023-02-04 NOTE — Telephone Encounter (Signed)
Left message for patient to call back and schedule Medicare Annual Wellness Visit (AWV).   Please offer to do virtually or by telephone.  Left office number and my jabber [phone].  AWVI eligible as of 10/08/2018   Please schedule at anytime with Nurse Health Advisor.

## 2023-02-05 ENCOUNTER — Other Ambulatory Visit: Payer: Self-pay

## 2023-02-05 DIAGNOSIS — M25569 Pain in unspecified knee: Secondary | ICD-10-CM

## 2023-02-05 MED ORDER — TIZANIDINE HCL 4 MG PO TABS: ORAL_TABLET | ORAL | 0 refills | Status: DC

## 2023-02-05 MED ORDER — GABAPENTIN 100 MG PO CAPS: ORAL_CAPSULE | ORAL | 3 refills | Status: DC

## 2023-02-05 MED ORDER — CELECOXIB 200 MG PO CAPS: 200.0000 mg | ORAL_CAPSULE | Freq: Two times a day (BID) | ORAL | 3 refills | Status: DC

## 2023-02-06 NOTE — Patient Instructions (Signed)

## 2023-02-06 NOTE — Progress Notes (Signed)
I connected with  Mariah Jackson on 02/07/2023 by a audio enabled telemedicine application and verified that I am speaking with the correct person using two identifiers.  Patient Location: Home  Provider Location: Office/Clinic  I discussed the limitations of evaluation and management by telemedicine. The patient expressed understanding and agreed to proceed.  Subjective:   Mariah Jackson is a 62 y.o. female who presents for Medicare Annual (Subsequent) preventive examination.  Review of Systems    Per HPI unless specifically indicated below.  Cardiac Risk Factors include: advanced age (>39mn, >>81women);female gender, Essential Hypertension, and Hypercholesterolemia.           Objective:       10/24/2022    8:41 AM 06/06/2022   11:20 AM 05/30/2022    9:53 AM  Vitals with BMI  Height 5' 4"$  5' 4"$    Weight 195 lbs 6 oz 194 lbs   BMI [id number]  Systolic [id number] Diastolic 76 77 70  Pulse [id number]     Today's Vitals   02/07/23 0910  PainSc: 0-No pain   There is no height or weight on file to calculate BMI.     02/07/2023    9:27 AM 11/04/2022    8:43 AM 10/24/2022    8:42 AM 05/30/2022    8:41 AM 05/24/2021   10:10 AM 02/10/2020    9:20 AM 03/04/2019   10:09 AM  Advanced Directives  Does Patient Have a Medical Advance Directive? No No No No No No No  Would patient like information on creating a medical advance directive? Yes (MAU/Ambulatory/Procedural Areas - Information given)  No - Patient declined No - Patient declined No - Patient declined No - Patient declined Yes (MAU/Ambulatory/Procedural Areas - Information given)    Current Medications (verified) Outpatient Encounter Medications as of 02/07/2023  Medication Sig   amLODipine (NORVASC) 10 MG tablet Take 1 tablet (10 mg total) by mouth daily.   celecoxib (CELEBREX) 200 MG capsule Take 1 capsule (200 mg total) by mouth 2 (two) times daily.   cholecalciferol (VITAMIN D3) 25 MCG (1000 UNIT) tablet Take 1,000 Units by  mouth daily.   dalfampridine 10 MG TB12 Take 1 tablet (10 mg total) by mouth every 12 (twelve) hours.   gabapentin (NEURONTIN) 100 MG capsule Take 1-2 capsules at bedtime as needed for nerve pain in your feet   levothyroxine (SYNTHROID) 100 MCG tablet TAKE 1 TABLET DAILY   Multiple Vitamin (MULTIVITAMIN) tablet Take 1 tablet by mouth daily.   omeprazole (PRILOSEC) 20 MG capsule TAKE 1 CAPSULE DAILY   rosuvastatin (CRESTOR) 10 MG tablet Take 1 tablet (10 mg total) by mouth daily.   spironolactone-hydrochlorothiazide (ALDACTAZIDE) 25-25 MG tablet TAKE 1 TABLET DAILY   TECFIDERA 240 MG CPDR TAKE ONE CAPSULE BY MOUTH TWICE DAILY. STORE IN ORIGINAL CONTAINER AT ROOM TEMPERATURE.   tiZANidine (ZANAFLEX) 4 MG tablet TAKE 1 TABLET(4 MG) BY MOUTH EVERY 8 HOURS AS NEEDED FOR MUSCLE SPASMS   valsartan (DIOVAN) 80 MG tablet Take 1 tablet (80 mg total) by mouth at bedtime.   No facility-administered encounter medications on file as of 02/07/2023.    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 concordant by Dr. Kristopher Oppenheim, with excision recommended.  Surgical consultation has been arranged with Dr. Erroll Luna at [facility] on March 19, 2019.  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  - SEE ONCOLOGY TABLE.   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 Pathology report non-cancerous, fibrous tissue.     History of ductal carcinoma in situ (DCIS) of breast 03/05/2019   Excision Biopsy 2017: DUCTAL CARCINOMA IN SITU, INTERMEDIATE GRADE WITH FOCAL  NECROSIS, 2 CM. Estrogen receptor positive  Progesterone receptor positive  8. TNM: pTispNXpMX   History of ductal carcinoma in situ (DCIS) of breast 03/05/2019   Excision Biopsy 2017: DUCTAL CARCINOMA IN SITU, INTERMEDIATE GRADE WITH FOCAL  NECROSIS, 2 CM. Estrogen receptor positive  Progesterone receptor positive  8. TNM: pTispNXpMX   Hypercalcemia, persistent    Hyperlipidemia    Hypertension    Hypothyroidism    Multinodular goiter (nontoxic) 08/29/2016   Multiple sclerosis ([place])    Multiple sclerosis, relapsing-remitting ([place]) 09/10/2013   Personal history of radiation therapy    Pure hypercholesterolemia 11/09/2014   Thyroid enlargement 11/09/2014   Right sided thyroid enlargement     Past Surgical History:  Procedure Laterality Date   BREAST BIOPSY Right 03/12/2019   atypical ductal hyperplasia complex sclerosing lesion   BREAST LUMPECTOMY  2007   left, radiation   MM BREAST STEREO BX*L*R/S Right 03/12/2019   RIGHT BREAST STEREOTACTIC CORE NEEDLE BIOPSY   TUBAL LIGATION  1985   Family History  Problem Relation Age of Onset   Stroke Father    Hypertension Mother    Pneumonia Mother    Hypertension Sister    Hypertension Brother    Diabetes Brother 50       Prediabetic   Hypertension Brother    Hypertension Brother    Hypertension Brother    Hypertension Brother    Hypertension Sister    Colon cancer Neg Hx    Breast cancer Neg Hx    Social History   Socioeconomic History   Marital status: Widowed     Spouse name: Not on file   Number of children: 2   Years of education: 54   Highest education level: 12th grade  Occupational History   Occupation: Unemployed    Occupation: Disabiliy  Tobacco Use   Smoking status: Never   Smokeless tobacco: Never   Tobacco comments:    no plans to start  Vaping Use   Vaping Use: Never used  Substance and Sexual Activity   Alcohol use: No   Drug use: No   Sexual activity: Not Currently  Other Topics Concern   Not on file  Social History Narrative   Patient a high school education.    Patient is unemployed.   Patient has 2 children.  Patient lives in her home with her brother, her son and DIL. One story home, ramp at back, 3-4 steps in front.  Has grab bar at tub but not toilet. Pets: 2 dogs, Miss Honey and Miss [place].   Smoke alarms in home. Carpet in home, bath mats in bathroom.  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    Patient is divorced.    Eats small meals during day. Meat, veggies, fruit. Has trouble staying asleep. Wakes up about 4 am after going to bed at 1030 pm.   Mobility:  Cane or walker   Transportation: SCAT or family member   Caregiver: daughter    Emergency Contact: daughter, [name], CNA   Advance Directive:  None   Resources:   -Financial payment plan with the neurology office.   -[place] program participate for MS which covers some medical expense for patient.         DME: cane, walker, BP monitor (digital), partial plate (upper), eyeglasses, ankle brace   Social Determinants of Health   Financial Resource Strain: Low Risk  (02/07/2023)   Overall Financial Resource Strain (CARDIA)    Difficulty of Paying Living Expenses: Not hard at all  Food Insecurity: No Food Insecurity (02/07/2023)   Hunger Vital Sign    Worried About Running Out of Food in the Last Year: Never true    Ran Out of Food in the Last Year: Never true  Transportation Needs: No  Transportation Needs (02/07/2023)   PRAPARE - Hydrologist (Medical): No    Lack of Transportation (Non-Medical): No  Physical Activity: Insufficiently Active (02/07/2023)   Exercise Vital Sign    Days of Exercise per Week: 7 days    Minutes of Exercise per Session: 10 min  Stress: No Stress Concern Present (02/07/2023)   [place]    Feeling of Stress : Not at all  Social Connections: Moderately Integrated (02/07/2023)   Social Connection and Isolation Panel [NHANES]    Frequency of Communication with Friends and Family: More than three times a week    Frequency of Social Gatherings with Friends and Family: More than three times a week    Attends Religious Services: More than 4 times per year    Active Member of [employer] or Organizations: Yes    Attends Archivist Meetings: Not on file    Marital Status: Widowed    Tobacco Counseling Counseling given: Not Answered Tobacco comments: no plans to start   Clinical Intake:     Pain : No/denies pain Pain Score: 0-No pain     Nutritional Status: BMI > 30  Obese Nutritional Risks: None Diabetes: No  How often do you need to have someone help you when you read instructions, pamphlets, or other written materials from your doctor or pharmacy?: 1 - Never  Diabetic?No  Interpreter Needed?: No  Information entered by :: Donnie Mesa, CMA   Activities of Daily Living    02/07/2023    9:08 AM  In your present state of health, do you have any difficulty performing the following activities:  Hearing? 0  Vision? [place]  Difficulty concentrating or making decisions? 0  Walking or climbing stairs? 1  Dressing or bathing? 0  Doing errands, shopping? 0    Patient Care Team: McDiarmid, Blane Ohara, MD as PCP - General (Family Medicine) Webb Laws, [place] as Referring Physician (Optometry) Marcial Pacas, MD as  Consulting Physician (Neurology) [place], Wilhemina Cash,  MD as Consulting Physician (Obstetrics and Gynecology)  Indicate any recent Medical Services you may have received from other than [facility] providers in the past year (date may be approximate).     Assessment:   This is a routine wellness examination for [facility].  Hearing/Vision screen Denies any hearing issues. Denies any changes with her vision. Wear glasses, State he get her Eye Exam at [facility]. Dietary issues and exercise activities discussed: Current Exercise Habits: Home exercise routine, Type of exercise: walking;strength training/weights, Time (Minutes): 15, Frequency (Times/Week): 7, Weekly Exercise (Minutes/Week): 105, Intensity: Mild, Exercise limited by: neurologic condition(s);orthopedic condition(s)   Goals Addressed             This Visit's Progress    Activity and Exercise Increased       Evidence-based guidance:  Review current exercise levels.  Assess patient perspective on exercise or activity level, barriers to increasing activity, motivation and readiness for change.  Recommend or set healthy exercise goal based on individual tolerance.  Encourage small steps toward making change in amount of exercise or activity.  Urge reduction of sedentary activities or screen time.  Promote group activities within the community or with family or support person.  Consider referral to rehabiliation therapist for assessment and exercise/activity plan.          Depression Screen    02/07/2023    9:07 AM 05/30/2022    8:42 AM 05/24/2021   10:10 AM 02/10/2020    9:20 AM 03/04/2019   10:09 AM 10/29/2018    9:33 AM 10/16/2018   10:13 AM  PHQ 2/9 Scores  PHQ - 2 Score 0 0 0 0 0 0 3  PHQ- 9 Score  0 0    4    Fall Risk    02/07/2023    9:08 AM 02/10/2020    9:19 AM 03/04/2019   10:09 AM 10/08/2018   10:21 AM 02/05/2018    8:27 AM  Fall Risk   Falls in the past year? 0 0 0 Yes Yes  Number falls in past yr: 0   1 1   Injury with Fall? 0   No No  Risk for fall due to : No Fall Risks   Impaired balance/gait;Impaired mobility   Follow up Falls evaluation completed   Falls prevention discussed     FALL RISK PREVENTION PERTAINING TO THE HOME:  Any stairs in or around the home? Yes  If so, are there any without handrails? No  Home free of loose throw rugs in walkways, pet beds, electrical cords, etc? Yes  Adequate lighting in your home to reduce risk of falls? Yes   ASSISTIVE DEVICES UTILIZED TO PREVENT FALLS:  Life alert? No  Use of a cane, walker or w/c? Yes  Grab bars in the bathroom? No  Shower chair or bench in shower? Yes  Elevated toilet seat or a handicapped toilet? No   TIMED UP AND GO:  Was the test performed? Unable to perform, virtual appointment    Cognitive Function:    10/08/2018   10:24 AM  MMSE - Mini Mental State Exam  Orientation to time 5  Orientation to Place 5  Registration 3  Attention/ Calculation 5  Recall 3  Language- name 2 objects 2  Language- repeat 1  Language- follow 3 step command 3  Language- read & follow direction 1  Write a sentence 1  Copy design 1  Total score 30        02/07/2023  9:09 AM 10/08/2018   10:25 AM  6CIT Screen  What Year? 0 points 0 points  What month? 0 points 0 points  What time? 0 points 0 points  Count back from 20 0 points 0 points  Months in reverse 0 points 0 points  Repeat phrase 0 points 0 points  Total Score 0 points 0 points    Immunizations Immunization History  Administered Date(s) Administered   Influenza,inj,Quad PF,6+ Mos 11/07/2014, 08/29/2016, 02/05/2018, 10/08/2018, 02/10/2020, 10/24/2022   PFIZER Comirnaty(Gray Top)Covid-19 Tri-Sucrose Vaccine 03/14/2020, 04/11/2020, 05/24/2021   Tdap 05/08/2016    TDAP status: Up to date  Flu Vaccine status: Up to date  Pneumococcal vaccine status: Up to date  Covid-19 vaccine status: Information provided on how to obtain vaccines.   Qualifies for Shingles  Vaccine? Yes   Zostavax completed No   Shingrix Completed?: No.    Education has been provided regarding the importance of this vaccine. Patient has been advised to call insurance company to determine out of pocket expense if they have not yet received this vaccine. Advised may also receive vaccine at local pharmacy or Health Dept. Verbalized acceptance and understanding.  Screening Tests Health Maintenance  Topic Date Due   Zoster Vaccines- Shingrix (1 of 2) Never done   COLONOSCOPY (Pts 45-28yr Insurance coverage will need to be confirmed)  05/07/2018   PAP SMEAR-Modifier  10/16/2021   COVID-19 Vaccine (4 - 2023-24 season) 08/30/2022   MAMMOGRAM  10/06/2023   Medicare Annual Wellness (AWV)  02/08/2024   DTaP/Tdap/Td (2 - Td or Tdap) 05/08/2026   INFLUENZA VACCINE  Completed   Hepatitis C Screening  Completed   HIV Screening  Completed   HPV VACCINES  Aged Out    Health Maintenance  Health Maintenance Due  Topic Date Due   Zoster Vaccines- Shingrix (1 of 2) Never done   COLONOSCOPY (Pts 45-459yrInsurance coverage will need to be confirmed)  05/07/2018   PAP SMEAR-Modifier  10/16/2021   COVID-19 Vaccine (4 - 2023-24 season) 08/30/2022    Colorectal cancer screening: Type of screening: Colonoscopy. Completed 05/07/2013. Repeat every 5 years. Overdue, order placed   Mammogram status: Completed 10/05/2021. Repeat every year  DEXA Scan: not applicable   Lung Cancer Screening: (Low Dose CT Chest recommended if Age 64-80ears, 30 pack-year currently smoking OR have quit w/in 15years.) does not qualify.   Lung Cancer Screening Referral: No Additional Screening:  Hepatitis C Screening: does qualify; Completed 05/08/2016  Vision Screening: Recommended annual ophthalmology exams for early detection of glaucoma and other disorders of the eye. Is the patient up to date with their annual eye exam?  No  Who is the provider or what is the name of the office in which the patient  attends annual eye exams? [facility] with a provider, would they like to be referred to a provider to establish care? No .   Dental Screening: Recommended annual dental exams for proper oral hygiene  Community Resource Referral / Chronic Care Management: CRR required this visit?  No   CCM required this visit?  No      Plan:     I have personally reviewed and noted the following in the patient's chart:   Medical and social history Use of alcohol, tobacco or illicit drugs  Current medications and supplements including opioid prescriptions. Patient is not currently taking opioid prescriptions. Functional ability and status Nutritional status Physical activity Advanced directives List of other physicians Hospitalizations,  surgeries, and ER visits in previous 12 months Vitals Screenings to include cognitive, depression, and falls Referrals and appointments  In addition, I have reviewed and discussed with patient certain preventive protocols, quality metrics, and best practice recommendations. A written personalized care plan for preventive services as well as general preventive health recommendations were provided to patient.     Wilson Singer, [place]   02/07/2023  Nurse Notes: Approximately 30 minute Non-Face -To-Face Medicare Wellness Visit

## 2023-02-07 ENCOUNTER — Other Ambulatory Visit: Payer: Self-pay | Admitting: Neurology

## 2023-02-07 ENCOUNTER — Ambulatory Visit (INDEPENDENT_AMBULATORY_CARE_PROVIDER_SITE_OTHER): Payer: Medicare HMO

## 2023-02-07 DIAGNOSIS — Z1211 Encounter for screening for malignant neoplasm of colon: Secondary | ICD-10-CM

## 2023-02-07 DIAGNOSIS — Z0000 Encounter for general adult medical examination without abnormal findings: Secondary | ICD-10-CM

## 2023-02-07 DIAGNOSIS — Z86010 Personal history of colonic polyps: Secondary | ICD-10-CM

## 2023-02-11 NOTE — Telephone Encounter (Signed)
Patient Advocate Encounter   Received notification that prior authorization for Tecfidera 240 mg dr capsules is required.   PA submitted on 02/11/2023 Key [id number] Status is pending       Lyndel Safe, CPhT Pharmacy Patient Advocate Specialist [place] Patient Advocate Team Direct Number: [phone]  Fax: [phone]

## 2023-02-11 NOTE — Telephone Encounter (Signed)
Pt is calling. Stated she received a letter stating this medication isn't on the preferred list and not covered by her insurance. Pt is requesting a call back to discuss.

## 2023-02-11 NOTE — Telephone Encounter (Signed)
Per message from our pharmacy department. We are working on the prior authorization once determination is made.

## 2023-02-12 NOTE — Telephone Encounter (Signed)
Error

## 2023-02-14 NOTE — Telephone Encounter (Signed)
Patient Advocate Encounter  Received notification  that the request for prior authorization for Tecfidera 240 mg dr capsules has been denied due to The originally requested drug is not on [employer] preferred drug list (formulary). Approval of the originally requested drug (called a formulary exception) requires that you first try some of the preferred drugs on your [facility] drug list (formulary), or tell us if they were/are not right for you. The drugs on [employer] drug list include: dimethyl fumarate capsule delayed release, Vumerity capsule delayed release.Lyndel Safe, [place] Patient Advocate Specialist [place] Patient Advocate Team Direct Number: [phone]  Fax: [phone]

## 2023-02-17 DIAGNOSIS — M21861 Other specified acquired deformities of right lower leg: Secondary | ICD-10-CM | POA: Diagnosis not present

## 2023-02-17 DIAGNOSIS — M21161 Varus deformity, not elsewhere classified, right knee: Secondary | ICD-10-CM | POA: Diagnosis not present

## 2023-02-17 DIAGNOSIS — M25361 Other instability, right knee: Secondary | ICD-10-CM | POA: Diagnosis not present

## 2023-02-17 MED ORDER — DIMETHYL FUMARATE 240 MG PO CPDR: 1.0000 | DELAYED_RELEASE_CAPSULE | Freq: Two times a day (BID) | ORAL | 2 refills | Status: DC

## 2023-02-17 NOTE — Telephone Encounter (Signed)
I called and spoke to the pt and we discussed changing to generic form. She is agreeable to this plan. She sts she has spoke with her pharmacy advocate and the cost should be 0$.   She will keep [place] up to date if any issues come up.  Rx cent to [employer].

## 2023-02-17 NOTE — Addendum Note (Signed)
Addended by: Verlin Grills on: 02/17/2023 11:26 AM   Modules accepted: Orders

## 2023-02-17 NOTE — Telephone Encounter (Signed)
Okay to switch to generic Tecfidera, please make the patient aware.

## 2023-02-27 ENCOUNTER — Other Ambulatory Visit: Payer: Self-pay | Admitting: Neurology

## 2023-03-13 ENCOUNTER — Other Ambulatory Visit: Payer: Self-pay

## 2023-03-13 DIAGNOSIS — M25569 Pain in unspecified knee: Secondary | ICD-10-CM

## 2023-03-13 NOTE — Telephone Encounter (Signed)
Patient calls nurse line in regards to Celebrex prescription.   She reports she is no longer using [facility] due to insurance. She requests we send to [place].   I have called [facility] and cancelled prescription with refills.   Will forward to PCP to send to [place].

## 2023-03-14 MED ORDER — CELECOXIB 200 MG PO CAPS: 200.0000 mg | ORAL_CAPSULE | Freq: Two times a day (BID) | ORAL | 3 refills | Status: DC

## 2023-03-21 ENCOUNTER — Other Ambulatory Visit: Payer: Self-pay

## 2023-03-21 DIAGNOSIS — E038 Other specified hypothyroidism: Secondary | ICD-10-CM

## 2023-03-25 ENCOUNTER — Other Ambulatory Visit: Payer: Self-pay

## 2023-03-25 DIAGNOSIS — I10 Essential (primary) hypertension: Secondary | ICD-10-CM

## 2023-03-25 DIAGNOSIS — K219 Gastro-esophageal reflux disease without esophagitis: Secondary | ICD-10-CM

## 2023-03-25 MED ORDER — LEVOTHYROXINE SODIUM 100 MCG PO TABS: 100.0000 ug | ORAL_TABLET | Freq: Every day | ORAL | 1 refills | Status: DC

## 2023-03-25 NOTE — Telephone Encounter (Signed)
[facility] needs appointment with [facility] before further refills  

## 2023-03-26 MED ORDER — OMEPRAZOLE 20 MG PO CPDR: 20.0000 mg | DELAYED_RELEASE_CAPSULE | Freq: Every day | ORAL | 3 refills | Status: DC

## 2023-03-26 MED ORDER — SPIRONOLACTONE-HCTZ 25-25 MG PO TABS: 1.0000 | ORAL_TABLET | Freq: Every day | ORAL | 0 refills | Status: DC

## 2023-05-01 ENCOUNTER — Ambulatory Visit (INDEPENDENT_AMBULATORY_CARE_PROVIDER_SITE_OTHER): Payer: Medicare HMO | Admitting: Family Medicine

## 2023-05-01 ENCOUNTER — Encounter: Payer: Self-pay | Admitting: Family Medicine

## 2023-05-01 VITALS — BP 129/75 | HR 100 | Ht 64.0 in | Wt 189.5 lb

## 2023-05-01 DIAGNOSIS — E038 Other specified hypothyroidism: Secondary | ICD-10-CM | POA: Diagnosis not present

## 2023-05-01 DIAGNOSIS — E7800 Pure hypercholesterolemia, unspecified: Secondary | ICD-10-CM

## 2023-05-01 DIAGNOSIS — I10 Essential (primary) hypertension: Secondary | ICD-10-CM | POA: Diagnosis not present

## 2023-05-01 DIAGNOSIS — N6091 Unspecified benign mammary dysplasia of right breast: Secondary | ICD-10-CM

## 2023-05-01 DIAGNOSIS — R739 Hyperglycemia, unspecified: Secondary | ICD-10-CM | POA: Diagnosis not present

## 2023-05-01 DIAGNOSIS — D126 Benign neoplasm of colon, unspecified: Secondary | ICD-10-CM | POA: Diagnosis not present

## 2023-05-01 DIAGNOSIS — R7303 Prediabetes: Secondary | ICD-10-CM

## 2023-05-01 DIAGNOSIS — E210 Primary hyperparathyroidism: Secondary | ICD-10-CM

## 2023-05-01 MED ORDER — SPIRONOLACTONE-HCTZ 25-25 MG PO TABS
1.0000 | ORAL_TABLET | Freq: Every day | ORAL | 3 refills | Status: DC
Start: 2023-05-01 — End: 2023-05-20

## 2023-05-01 NOTE — Patient Instructions (Signed)
Your blood pressure is under good control.  Keep taking your blood pressure medicines.    Congratulation on losing weight!  A referral to Gastroenterologist was made to follow up the pre-cancer that was found in 2014 by Dr Arlyce Dice.  A referral was made to the General Surgeons to talk about the pre-cancer that was found in your right breast in 2020.    If you have not heard from the referral doctors in 5 business day, please let Dr Alfrieda Tarry's office know.

## 2023-05-01 NOTE — Progress Notes (Signed)
Otis Hickson is alone Sources of clinical information for visit is/are patient. Nursing assessment for this office visit was reviewed with the patient for accuracy and revision.     Previous Report(s) Reviewed: none     05/01/2023    9:58 AM  Depression screen PHQ 2/9  Decreased Interest 0  Down, Depressed, Hopeless 0  PHQ - 2 Score 0  Altered sleeping 0  Tired, decreased energy 0  Change in appetite 0  Feeling bad or failure about yourself  0  Trouble concentrating 0  Moving slowly or fidgety/restless 0  Suicidal thoughts 0  PHQ-9 Score 0   Flowsheet Row Office Visit from 05/01/2023 in [facility] Office Visit from 05/30/2022 in [facility] Office Visit from 05/24/2021 in [facility]  Thoughts that you would be better off dead, or of hurting yourself in some way Not at all Not at all Not at all  PHQ-9 Total Score 0 0 0          02/07/2023    9:08 AM 02/10/2020    9:19 AM 03/04/2019   10:09 AM 10/08/2018   10:21 AM 02/05/2018    8:27 AM  Fall Risk   Falls in the past year? 0 0 0 Yes Yes  Number falls in past yr: 0   1 1  Injury with Fall? 0   No No  Risk for fall due to : No Fall Risks   Impaired balance/gait;Impaired mobility   Follow up Falls evaluation completed   Falls prevention discussed        05/01/2023    9:58 AM 02/07/2023    9:07 AM 05/30/2022    8:42 AM  PHQ9 SCORE ONLY  PHQ-9 Total Score 0 0 0    There are no preventive care reminders to display for this patient.  Health Maintenance Due  Topic Date Due   Zoster Vaccines- Shingrix (1 of 2) Never done   COLONOSCOPY (Pts 45-87yrs Insurance coverage will need to be confirmed)  05/07/2018   PAP SMEAR-Modifier  10/16/2021      History/P.E. limitations: none  There are no preventive care reminders to display for this patient. There are no preventive care reminders to display for this patient.  Health Maintenance Due  Topic Date Due   Zoster  Vaccines- Shingrix (1 of 2) Never done   COLONOSCOPY (Pts 45-15yrs Insurance coverage will need to be confirmed)  05/07/2018   PAP SMEAR-Modifier  10/16/2021     Chief Complaint  Patient presents with   Hypertension     --------------------------------------------------------------------------------------------------------------------------------------------- Visit Problem List with A/P  Atypical ductal hyperplasia of right breast Established problem Ms Gabin had right breast core needle biopsy 03/12/19 that showed atypical ductal hyperplasia in complex scleorsing lesion.  Excision was recommended by Dr Sande Brothers and referral was made to [facility].  Patient did not follow up on referral. She is fearful of a diagnosis of breast cancer, so did not go to the referral.  She has had a prior left breast lumpectomy with XRT in 2007.  On review of records, her 04/10/2006 biopsy showed  ductal carcinoma in situs, intermediate grade with focal necrosis, 2 cm.   Ms Basil agreed today to consult with general surgery about the right breast lesion from 2020.  A referral was sent to [facility].   Primary hyperparathyroidism (HCC) Established problem Patient remains asymptomatic. Serum calcium today 10.5, it was 10.7 last June Will  get DEXA today  Continue to monitor for primary hyperparathyroid symptoms and serum calcium levels.    Essential hypertension Established problem Well Controlled. Patient is at goal of < 140/90. No signs of complications, medication side effects, or red flags. Continue current medications and other regiments.   Hypothyroidism Established problem Well Controlled. Patient is at goal of TSH 2.480. No signs of complications, medication side effects, or red flags. Continue levothyroxine 100 microgram daily.   Pure hypercholesterolemia Established problem [place] statin Continue rosuvasttin 10 mg daily.

## 2023-05-01 NOTE — Assessment & Plan Note (Addendum)
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  Excision was recommended by Dr Sande Brothers and referral was made to [facility].  Patient did not follow up on referral. She is fearful of a diagnosis of breast cancer, so did not go to the referral.  She has had a prior left breast lumpectomy with XRT in 2007.  On review of records, her 04/10/2006 biopsy showed  ductal carcinoma in situs, intermediate grade with focal necrosis, 2 cm.   Mariah Jackson agreed today to consult with general surgery about the right breast lesion from 2020.  Mariah Jackson has initial consultation appointment with Barrie Dunker, MD (GS) on 06/06/23.  Addendum: Mammogram 05/29/23, "The biopsy site is difficult to follow for small changes and there has only been 1 mammogram since the initial biopsy. The last mammogram was October 05, 2021. Given difference in positioning, there has been no definitive change.   No new or suspicious findings in either breast.   IMPRESSION: No definite change at the biopsy site. No other suspicious interval changes.   RECOMMENDATION: Recommended surgical consultation with excision at the previous biopsy site is this area will be difficult to follow for small changes. Recommend annual screening mammography.

## 2023-05-02 LAB — RENAL FUNCTION PANEL
Albumin: 4.6 g/dL (ref 3.9–4.9)
BUN/Creatinine Ratio: 15 (ref 12–28)
BUN: 16 mg/dL (ref 8–27)
CO2: 21 mmol/L (ref 20–29)
Calcium: 10.5 mg/dL — ABNORMAL HIGH (ref 8.7–10.3)
Chloride: 100 mmol/L (ref 96–106)
Creatinine, Ser: 1.04 mg/dL — ABNORMAL HIGH (ref 0.57–1.00)
Glucose: 113 mg/dL — ABNORMAL HIGH (ref 70–99)
Phosphorus: 3.2 mg/dL (ref 3.0–4.3)
Potassium: 3.8 mmol/L (ref 3.5–5.2)
Sodium: 139 mmol/L (ref 134–144)
eGFR: 60 mL/min/{1.73_m2} (ref >59)

## 2023-05-02 LAB — TSH: TSH: 2.48 u[IU]/mL (ref 0.450–4.500)

## 2023-05-02 MED ORDER — LEVOTHYROXINE SODIUM 100 MCG PO TABS
100.0000 ug | ORAL_TABLET | Freq: Every day | ORAL | 3 refills | Status: DC
Start: 2023-05-02 — End: 2024-03-18

## 2023-05-02 NOTE — Assessment & Plan Note (Signed)
Established problem Well Controlled. Patient is at goal of < 140/90. No signs of complications, medication side effects, or red flags. Continue current medications and other regiments.  

## 2023-05-02 NOTE — Assessment & Plan Note (Signed)
Established problem Patient remains asymptomatic. Serum calcium today 10.5, it was 10.7 last June Will get DEXA today  Continue to monitor for primary hyperparathyroid symptoms and serum calcium levels.

## 2023-05-02 NOTE — Assessment & Plan Note (Signed)
Established problem Toelrating statin Continue rosuvasttin 10 mg daily.

## 2023-05-02 NOTE — Addendum Note (Signed)
Addended byPerley Jain, Pearse Shiffler D on: 05/02/2023 11:05 AM   Modules accepted: Orders

## 2023-05-02 NOTE — Assessment & Plan Note (Signed)
Established problem Well Controlled. Patient is at goal of TSH 2.480. No signs of complications, medication side effects, or red flags. Continue levothyroxine 100 microgram daily.

## 2023-05-12 ENCOUNTER — Other Ambulatory Visit: Payer: Self-pay | Admitting: Family Medicine

## 2023-05-12 DIAGNOSIS — N6489 Other specified disorders of breast: Secondary | ICD-10-CM

## 2023-05-12 DIAGNOSIS — N6099 Unspecified benign mammary dysplasia of unspecified breast: Secondary | ICD-10-CM

## 2023-05-20 ENCOUNTER — Other Ambulatory Visit: Payer: Self-pay | Admitting: Family Medicine

## 2023-05-20 DIAGNOSIS — I10 Essential (primary) hypertension: Secondary | ICD-10-CM

## 2023-05-29 ENCOUNTER — Ambulatory Visit
Admission: RE | Admit: 2023-05-29 | Discharge: 2023-05-29 | Disposition: A | Discharge status: Home / Self Care | Payer: Medicare HMO | Source: Ambulatory Visit | Attending: Family Medicine | Admitting: Family Medicine

## 2023-05-29 DIAGNOSIS — N6489 Other specified disorders of breast: Secondary | ICD-10-CM

## 2023-05-29 DIAGNOSIS — R92323 Mammographic fibroglandular density, bilateral breasts: Secondary | ICD-10-CM | POA: Diagnosis not present

## 2023-05-29 DIAGNOSIS — N6099 Unspecified benign mammary dysplasia of unspecified breast: Secondary | ICD-10-CM

## 2023-05-29 DIAGNOSIS — N6039 Fibrosclerosis of unspecified breast: Secondary | ICD-10-CM | POA: Diagnosis not present

## 2023-05-31 ENCOUNTER — Other Ambulatory Visit: Payer: Self-pay | Admitting: Neurology

## 2023-06-03 ENCOUNTER — Encounter: Payer: Self-pay | Admitting: Neurology

## 2023-06-03 ENCOUNTER — Ambulatory Visit: Payer: Medicare HMO | Admitting: Neurology

## 2023-06-03 VITALS — BP 128/96 | HR 98 | Ht 64.0 in | Wt 192.0 lb

## 2023-06-03 DIAGNOSIS — R269 Unspecified abnormalities of gait and mobility: Secondary | ICD-10-CM

## 2023-06-03 DIAGNOSIS — G35 Multiple sclerosis: Secondary | ICD-10-CM

## 2023-06-03 MED ORDER — DIMETHYL FUMARATE 240 MG PO CPDR: 1.0000 | DELAYED_RELEASE_CAPSULE | Freq: Two times a day (BID) | ORAL | 3 refills | Status: DC

## 2023-06-03 MED ORDER — DALFAMPRIDINE ER 10 MG PO TB12: 10.0000 mg | ORAL_TABLET | Freq: Two times a day (BID) | ORAL | 11 refills | Status: DC

## 2023-06-03 NOTE — Progress Notes (Signed)
[facility]  PATIENT: Mariah Jackson DOB: [birth year]  HISTORY OF PRESENT ILLNESS: HISTORY: Mariah Jackson, 62 year old female returns for followup for RRMS  She was diagnosed with multiple sclerosis in 2001, by Dr. Tinnie Gens at [facility]. The disease has primarily affected her balance and gait. She could only clarify one exacerbation, presenting as worsening balance difficulty, and says she has never been treated with intravenous steroids.   She was placed on Betaseron shortly after diagnosis, and has been on that drug ever since.  Her gait difficulty had little changes over the  years. She ambulates with a cane, but is able to get around the house pretty well without it.   Most recent MRI was in January 2011,shows multiple periventricular, periatrial and subcortical white matter hyperintensities suggestive of demyelinating disease.No enhancing lesions or significant atrophy is seen. compared to MRI films from Aug 2000 there appear to be more T2 lesions in right frontal subcortical region.   She is tired of taking the shots and does have multiple areas of lipoatrophy, especially on the abdominal area. She is ambulating with a cane. She  switched to Tecfidera since Oct 2014, doing well.  UPDATE Sep 18th 2015: She denies signficant side effect from Tecfidera.  She has been on Ampyra 10mg  bid for few years, which definitely has helped her walking  MRI brain March 2014: Multiple periventricular and subcortical chronic demyelinating disease. No acute plaques are seen. MRI cervical spine: Disc bulging at C5-6. Spondylosis at C5-6 and C6-7. C5-6 there is focal disc protrusion slightly deforms anterior spinal cord, with spinal stenosis or foraminal stenosis.  No intrinsic or enhancing spinal cord lesions.  UPDATE March 17th 2016: She fell in Feb 2016, now with right shoulder and neck pain, overall has improved, she is continue taking Tecfidera, Ampyra, denies significant side effect,  ambulate with spastic gait, wear her right ankle brace.  UPDATE July 16 2017: She is overall doing well, no flareup, continue have gait abnormality, complains of bilateral knee pain, has valgrus, reviewed laboratory evaluations, normal CMP, elevated alkaline phosphate 151, mild elevated glucose 101, CBC showed low MCV 75, normal TSH  Update March 16, 2018:  She is overall doing very well, no significant change, continue taking Ampyra, using of one-point cane, unsteady gait,  I have personally reviewed MRI of brain with and without contrast in August 2018, multiple T2/FLAIR hyperintensity lesions at both hemisphere, no contrast enhancement, there was no infratentorial lesions, no significant change compared to previous scans.  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  I personally reviewed most recent MRI of brain with without contrast in August 2018, multiple T2/flair hyperintensity foci in the white matter of both hemispheres, with large confluences in the periatrial white matter bilaterally,  Laboratory evaluations in 2020 showed normal TSH, elevated calcium 11, normal creatinine 0.94, normal PTH 63  UPDATE March 14 2020: Her brother drove her to clinic today, she is doing well, ambulate with a cane or walker for longer distance, denies bowel and bladder incontinence, complains of  occasional bilateral calf muscle tightness, especially during morning times, she tolerating Tecfidera twice a day well, also  taking 10 mg twice a day  She denies significant worsening of functional status, actually works better  Update May 31, 2021 SS: Feels MS is stable, got a rollator, chronic right ankle issues, flared up today. No recent falls. She actually thinks she is better, is walking more, can use her cane, goes out and shops. Brother lives with her drives with her. 2 little dogs, Bella, Honey.   MRI brain ordered last visit, not completed, she got scared, doesn't feel necessary. Remains on Tecfidera, Ampyra.  Reviewed recent labs  05/24/21, CMP showed calcium 11.1, normal liver function.  A1c 5.8, TSH 3.090, CBC WBC 4.3, no differential was done. No longer taking gabapentin. Does home exercises arm, leg presses/squeezes, walks on her ramp. Celebrex helps with stiffness. Please how well she is doing.   Update 06/06/22 SS: Doing wonderful, still on Tecfidera, using walker/cane, no fall, feels right knee is weak, chronic right ankle issues, gaining weight, going back on weight watchers, Ampyra helps with her walking stability. Doesn't want an MRI, is afraid. No problems with vision, numbness, b/b. Brakes on walker don't work, duct tape on sides. Lives with her brother, she doesn't drive.  Labs 05/30/22 Vitamin D 32.2, glucose 129, calcium 10.7, TSH 2.170, A1c 5.7.  Update June 03, 2023 SS: Remains on generic tecfidera, gets for free. Did PT wearing ankle brace, supposed to wear knee brace but it hurts. Using walker, cane. No falls. Vision is fine, left eye sleep is dry. Wakes up at night. No new numbness or weakness. Takes Tizanidine as needed for muscle aches, likes the Celebrex better. Doesn't take gabapentin..Brother lives with her, she doesn't drive. She got a new walker. Doesn't want an MRI/   Will REVIEW OF SYSTEMS:   See HPI   ALLERGIES: Allergies  Allergen Reactions   Vicodin [Hydrocodone-Acetaminophen] Nausea Only    HOME MEDICATIONS: Outpatient Medications Prior to Visit  Medication Sig Dispense  Refill   amLODipine (NORVASC) 10 MG tablet Take 1 tablet (10 mg total) by mouth daily. 90 tablet 3   celecoxib (CELEBREX) 200 MG capsule Take 1 capsule (200 mg total) by mouth 2 (two) times daily. 180 capsule 3   cholecalciferol (VITAMIN D3) 25 MCG (1000 UNIT) tablet Take 1,000 Units by mouth daily.     gabapentin (NEURONTIN) 100 MG capsule Take 1-2 capsules at bedtime as needed for nerve pain in your feet 180 capsule 3   levothyroxine (SYNTHROID) 100 MCG tablet Take 1 tablet (100 mcg total) by mouth daily. 90 tablet 3   Multiple Vitamin (MULTIVITAMIN) tablet Take 1 tablet by mouth daily.     omeprazole (PRILOSEC) 20 MG capsule Take 1 capsule (20 mg total) by mouth daily. 90 capsule 3   rosuvastatin (CRESTOR) 10 MG tablet Take 1 tablet (10 mg total) by mouth daily. 90 tablet 3   spironolactone-hydrochlorothiazide (ALDACTAZIDE) 25-25 MG tablet TAKE 1 TABLET EVERY DAY (APPOINTMENT IS NEEDED) 90 tablet 3   TECFIDERA 240 MG CPDR TAKE ONE CAPSULE BY MOUTH TWICE DAILY. STORE IN ORIGINAL CONTAINER AT ROOM TEMPERATURE. 180 capsule 3   tiZANidine (ZANAFLEX) 4 MG tablet TAKE 1 TABLET EVERY 8 HOURS AS NEEDED FOR MUSCLE SPASM(S) 90 tablet 11   valsartan (DIOVAN) 80 MG tablet Take 1 tablet (80 mg total) by mouth at bedtime. 90 tablet 3   dalfampridine 10 MG TB12 Take 1 tablet (10 mg total) by mouth every 12 (  twelve) hours. 60 tablet 5   Dimethyl Fumarate 240 MG CPDR Take 1 capsule (240 mg total) by mouth in the morning and at bedtime. 180 capsule 2   No facility-administered medications prior to visit.    PAST MEDICAL HISTORY: Past Medical History:  Diagnosis Date   Abnormality of gait 09/10/2013   Arthritis    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   [facility]: Right Breast Core Needle Biopsy 03/12/19: Pathology revealed ATYPICAL DUCTAL HYPERPLASIA ARISING IN A COMPLEX SCLEROSING LESION of the Right breast, medial, middle depth. concordant by Dr. Sande Brothers, with excision  recommended.  Surgical consultation has been arranged with Dr. Harriette Bouillon at [facility] on March 19, 2019.  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  - SEE ONCOLOGY TABLE.   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 Pathology report non-cancerous, fibrous tissue.     History of ductal carcinoma in situ (DCIS) of breast 03/05/2019   Excision Biopsy 2017: DUCTAL CARCINOMA IN SITU, INTERMEDIATE GRADE WITH FOCAL  NECROSIS, 2 CM. Estrogen receptor positive  Progesterone receptor positive  8. TNM: pTispNXpMX   History of ductal carcinoma in situ (DCIS) of breast 03/05/2019   Excision Biopsy 2017: DUCTAL CARCINOMA IN SITU, INTERMEDIATE GRADE WITH FOCAL  NECROSIS, 2 CM. Estrogen receptor positive  Progesterone receptor positive  8. TNM: pTispNXpMX   Hypercalcemia, persistent    Hyperlipidemia    Hypertension    Hypothyroidism    Multinodular goiter (nontoxic) 08/29/2016   Multiple sclerosis (HCC)    Multiple sclerosis, relapsing-remitting (HCC) 09/10/2013   Personal history of radiation therapy    Pure hypercholesterolemia 11/09/2014   Thyroid enlargement 11/09/2014   Right sided thyroid enlargement      PAST SURGICAL HISTORY: Past Surgical History:  Procedure Laterality Date   BREAST BIOPSY Right 03/12/2019   atypical ductal hyperplasia complex sclerosing lesion   BREAST LUMPECTOMY  2007   left, radiation   MM BREAST STEREO BX*L*R/S  Right 03/12/2019   RIGHT BREAST STEREOTACTIC CORE NEEDLE BIOPSY   TUBAL LIGATION  1985    FAMILY HISTORY: Family History  Problem Relation Age of Onset   Hypertension Mother    Pneumonia Mother    Stroke Father    Breast cancer Sister 36   Hypertension Sister    Hypertension Sister    Hypertension Brother    Diabetes Brother 65       Prediabetic   Hypertension Brother    Hypertension Brother    Hypertension Brother    Hypertension Brother    Colon cancer Neg Hx     SOCIAL HISTORY: Social History   Socioeconomic History   Marital status: Widowed    Spouse name: Not on file   Number of children: 2   Years of education: 78   Highest education level: 12th grade  Occupational History   Occupation: Unemployed    Occupation: Disabiliy  Tobacco Use   Smoking  status: Never   Smokeless tobacco: Never   Tobacco comments:    no plans to start  Vaping Use   Vaping Use: Never used  Substance and Sexual Activity   Alcohol use: No   Drug use: No   Sexual activity: Not Currently  Other Topics Concern   Not on file  Social History Narrative   Patient a high school education.    Patient is unemployed.   Patient has 2 children.  Patient lives in her home with her brother, her son and DIL. One story home, ramp at back, 3-4 steps in front. Has grab bar at tub but not toilet. Pets: 2 dogs, Miss Honey and Miss [place].   Smoke alarms in home. Carpet in home, bath mats in bathroom.  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    Patient is divorced.    Eats small meals during day. Meat, veggies, fruit. Has trouble staying asleep. Wakes up about 4 am after going to bed at 1030 pm.   Mobility:  Cane or walker   Transportation: [facility] or family member   Caregiver: daughter    Emergency Contact: daughter, Cleotis Nipper, CNA   Advance Directive:  None   Resources:   -Financial payment plan with the neurology office.   -MS Society    -Research program participate for MS which covers some medical expense for patient.         DME: cane, walker, BP monitor (digital), partial plate (upper), eyeglasses, ankle brace   Social Determinants of Health   Financial Resource Strain: Low Risk  (02/07/2023)   Overall Financial Resource Strain (CARDIA)    Difficulty of Paying Living Expenses: Not hard at all  Food Insecurity: No Food Insecurity (02/07/2023)   Hunger Vital Sign    Worried About Running Out of Food in the Last Year: Never true    Ran Out of Food in the Last Year: Never true  Transportation Needs: No Transportation Needs (02/07/2023)   PRAPARE - Administrator, Civil Service (Medical): No    Lack of Transportation (Non-Medical): No  Physical Activity: Insufficiently Active (02/07/2023)   Exercise Vital Sign    Days of Exercise per Week: 7 days    Minutes of Exercise per Session: 10 min  Stress: No Stress Concern Present (02/07/2023)   [employer] of Occupational Health - Occupational Stress Questionnaire    Feeling of Stress : Not at all  Social Connections: Moderately Integrated (02/07/2023)   Social Connection and Isolation Panel [NHANES]    Frequency of Communication with Friends and Family: More than three times a week    Frequency of Social Gatherings with Friends and Family: More than three times a week    Attends Religious Services: More than 4 times per year    Active Member of [employer] or Organizations: Yes    Attends Banker Meetings: Not on file    Marital Status: Widowed  Intimate Partner Violence: Not At Risk (02/07/2023)   Humiliation, Afraid, Rape, and Kick questionnaire    Fear of Current or Ex-Partner: No    Emotionally Abused: No    Physically Abused: No    Sexually Abused: No   PHYSICAL EXAM  Vitals:   06/03/23 1044  BP: (!) 128/96  Pulse: 98  Weight: 192 lb (87.1 kg)  Height: 5\' 4"  (1.626 m)   Body mass index is 32.96 kg/m.  Physical Exam  General:  The patient is  alert and cooperative at the time of the examination. Very pleasant, engaging  Skin: No significant peripheral edema is noted.  Neurologic Exam  Mental status: The patient is alert and oriented x 3 at the time of the examination. The patient has apparent normal recent and remote memory, with an apparently normal attention span and concentration ability.  Cranial nerves: Facial symmetry is present. Speech is normal, no aphasia or dysarthria is noted. Extraocular movements are full. Visual fields are full.  Motor: The patient has good strength in all 4 extremities. Right ankle wrapped with brace. Mild weakness right hip flexion  Sensory examination: Soft touch sensation is symmetric on the face, arms, and legs.  Coordination: The patient has good finger-nose-finger and heel-to-shin bilaterally.  Gait and station: Relies on a walker to stand,  flat footed gait, cautious, stiff,   Reflexes: Deep tendon reflexes are symmetric, but slightly increased at the knees  DIAGNOSTIC DATA (LABS, IMAGING, TESTING) - I reviewed patient records, labs, notes, testing and imaging myself where available.      Component Value Date/Time   NA 139 05/01/2023 1216   K 3.8 05/01/2023 1216   CL 100 05/01/2023 1216   CO2 21 05/01/2023 1216   GLUCOSE 113 (H) 05/01/2023 1216   GLUCOSE 91 08/29/2016 1227   BUN 16 05/01/2023 1216   CREATININE 1.04 (H) 05/01/2023 1216   CREATININE 0.82 08/29/2016 1227   CALCIUM 10.5 (H) 05/01/2023 1216   PROT 7.6 06/06/2022 1145   ALBUMIN 4.6 05/01/2023 1216   AST 20 06/06/2022 1145   ALT 20 06/06/2022 1145   ALKPHOS 126 (H) 06/06/2022 1145   BILITOT 0.5 06/06/2022 1145   GFRNONAA 75 04/05/2020 1010   GFRNONAA 80 08/29/2016 1227   GFRAA 87 04/05/2020 1010   GFRAA >89 08/29/2016 1227    ASSESSMENT AND PLAN  1. Relapsing remitting multiple sclerosis 2.  Gait abnormality  -Mariah Jackson is doing overall well, remains stable -We will continue dimethyl fumarate for MS,  dalfampridine for gait -Takes tizanidine as needed for muscle spasms, no longer takes gabapentin -Check CBC, CMP today -Is not interested in MRI imaging, Last MRI of the brain with and without contrast in August 2018 showed evidence of supratentorium MS lesions, no contrast-enhancement, no change compared to prior MRIs -Encouraged to continue exercise, activity -Follow-up in 1 year or sooner if needed  Mariah Jackson, Edrick Oh, DNP  [facility] [address] [place], [place] [address] [phone]

## 2023-06-04 ENCOUNTER — Telehealth: Payer: Self-pay

## 2023-06-04 LAB — CBC WITH DIFFERENTIAL/PLATELET
Basophils Absolute: 0 10*3/uL (ref 0.0–0.2)
Basos: 0 %
EOS (ABSOLUTE): 0 10*3/uL (ref 0.0–0.4)
Eos: 0 %
Hematocrit: 42.3 % (ref 34.0–46.6)
Hemoglobin: 14.6 g/dL (ref 11.1–15.9)
Immature Grans (Abs): 0 10*3/uL (ref 0.0–0.1)
Immature Granulocytes: 0 %
Lymphocytes Absolute: 0.7 10*3/uL (ref 0.7–3.1)
Lymphs: 23 %
MCH: 26.7 pg (ref 26.6–33.0)
MCHC: 34.5 g/dL (ref 31.5–35.7)
MCV: 77 fL — ABNORMAL LOW (ref 79–97)
Monocytes Absolute: 0.3 10*3/uL (ref 0.1–0.9)
Monocytes: 9 %
Neutrophils Absolute: 2.1 10*3/uL (ref 1.4–7.0)
Neutrophils: 68 %
Platelets: 246 10*3/uL (ref 150–450)
RBC: 5.47 x10E6/uL — ABNORMAL HIGH (ref 3.77–5.28)
RDW: 14.4 % (ref 11.7–15.4)
WBC: 3.2 10*3/uL — ABNORMAL LOW (ref 3.4–10.8)

## 2023-06-04 LAB — COMPREHENSIVE METABOLIC PANEL
ALT: 21 IU/L (ref 0–32)
AST: 19 IU/L (ref 0–40)
Albumin/Globulin Ratio: 1.5 (ref 1.2–2.2)
Albumin: 4.6 g/dL (ref 3.9–4.9)
Alkaline Phosphatase: 135 IU/L — ABNORMAL HIGH (ref 44–121)
BUN/Creatinine Ratio: 13 (ref 12–28)
BUN: 12 mg/dL (ref 8–27)
Bilirubin Total: 0.5 mg/dL (ref 0.0–1.2)
CO2: 25 mmol/L (ref 20–29)
Calcium: 11.4 mg/dL — ABNORMAL HIGH (ref 8.7–10.3)
Chloride: 104 mmol/L (ref 96–106)
Creatinine, Ser: 0.93 mg/dL (ref 0.57–1.00)
Globulin, Total: 3 g/dL (ref 1.5–4.5)
Glucose: 120 mg/dL — ABNORMAL HIGH (ref 70–99)
Potassium: 3.9 mmol/L (ref 3.5–5.2)
Sodium: 141 mmol/L (ref 134–144)
Total Protein: 7.6 g/dL (ref 6.0–8.5)
eGFR: 69 mL/min/{1.73_m2} (ref >59)

## 2023-06-04 NOTE — Telephone Encounter (Signed)
-----   Message from Glean Salvo, NP sent at 06/04/2023 12:35 PM EDT ----- Please call, labs show mildly low WBC count 3.2, elevated calcium level of 11.4 for unknown reason, mildly elevated alkaline phosphatase 135.  Rest of blood work is unremarkable.  Please forward to PCP regarding calcium.  I would like to recheck a CBC in 6 weeks to ensure her white blood cell count is not dropping further on Tecfidera.  I will send myself a reminder to contact her.

## 2023-06-04 NOTE — Telephone Encounter (Signed)
Left msg to call back to obtain result 1st attempt

## 2023-06-09 ENCOUNTER — Other Ambulatory Visit: Payer: Self-pay

## 2023-06-09 DIAGNOSIS — G35 Multiple sclerosis: Secondary | ICD-10-CM

## 2023-06-09 MED ORDER — DALFAMPRIDINE ER 10 MG PO TB12
10.0000 mg | ORAL_TABLET | Freq: Two times a day (BID) | ORAL | 11 refills | Status: DC
Start: 2023-06-09 — End: 2023-10-15

## 2023-06-30 ENCOUNTER — Other Ambulatory Visit: Payer: Self-pay | Admitting: Surgery

## 2023-06-30 DIAGNOSIS — N6091 Unspecified benign mammary dysplasia of right breast: Secondary | ICD-10-CM

## 2023-07-16 ENCOUNTER — Telehealth: Payer: Self-pay | Admitting: Neurology

## 2023-07-16 DIAGNOSIS — G35 Multiple sclerosis: Secondary | ICD-10-CM

## 2023-07-16 NOTE — Telephone Encounter (Signed)
Please ask her to come for CBC recheck of WBC count. Thanks  Orders Placed This Encounter  Procedures   CBC with Differential/Platelet

## 2023-07-17 ENCOUNTER — Other Ambulatory Visit (INDEPENDENT_AMBULATORY_CARE_PROVIDER_SITE_OTHER): Payer: Self-pay

## 2023-07-17 DIAGNOSIS — Z0289 Encounter for other administrative examinations: Secondary | ICD-10-CM

## 2023-07-17 DIAGNOSIS — G35A Relapsing-remitting multiple sclerosis: Secondary | ICD-10-CM

## 2023-07-17 DIAGNOSIS — G35 Multiple sclerosis: Secondary | ICD-10-CM | POA: Diagnosis not present

## 2023-07-17 NOTE — Telephone Encounter (Signed)
Call to patient and she verbalized understanding of ordered for lab work. She wanted to come yesterday but wasn't feeling the greatest. She states she will come by today or tomorrow an dis aware of limited hours on Friday.

## 2023-07-18 LAB — CBC WITH DIFFERENTIAL/PLATELET
Basophils Absolute: 0 10*3/uL (ref 0.0–0.2)
Basos: 0 %
EOS (ABSOLUTE): 0 10*3/uL (ref 0.0–0.4)
Eos: 0 %
Hematocrit: 44.5 % (ref 34.0–46.6)
Hemoglobin: 14.6 g/dL (ref 11.1–15.9)
Immature Grans (Abs): 0 10*3/uL (ref 0.0–0.1)
Immature Granulocytes: 0 %
Lymphocytes Absolute: 1.1 10*3/uL (ref 0.7–3.1)
Lymphs: 28 %
MCH: 25.8 pg — ABNORMAL LOW (ref 26.6–33.0)
MCHC: 32.8 g/dL (ref 31.5–35.7)
MCV: 79 fL (ref 79–97)
Monocytes Absolute: 0.4 10*3/uL (ref 0.1–0.9)
Monocytes: 10 %
Neutrophils Absolute: 2.4 10*3/uL (ref 1.4–7.0)
Neutrophils: 62 %
Platelets: 250 10*3/uL (ref 150–450)
RBC: 5.66 x10E6/uL — ABNORMAL HIGH (ref 3.77–5.28)
RDW: 14.5 % (ref 11.7–15.4)
WBC: 3.9 10*3/uL (ref 3.4–10.8)

## 2023-08-15 DIAGNOSIS — H524 Presbyopia: Secondary | ICD-10-CM | POA: Diagnosis not present

## 2023-08-15 DIAGNOSIS — H52223 Regular astigmatism, bilateral: Secondary | ICD-10-CM | POA: Diagnosis not present

## 2023-08-15 DIAGNOSIS — H2513 Age-related nuclear cataract, bilateral: Secondary | ICD-10-CM | POA: Diagnosis not present

## 2023-08-15 DIAGNOSIS — H5213 Myopia, bilateral: Secondary | ICD-10-CM | POA: Diagnosis not present

## 2023-09-15 ENCOUNTER — Encounter: Payer: Self-pay | Admitting: Gastroenterology

## 2023-10-07 ENCOUNTER — Telehealth: Payer: Self-pay | Admitting: *Deleted

## 2023-10-07 ENCOUNTER — Ambulatory Visit: Payer: Medicare HMO

## 2023-10-07 NOTE — Progress Notes (Unsigned)
Pt's name and DOB verified at the beginning of the pre-visit wit 2 identifiers  Pt denies any difficulty with ambulating,sitting, laying down or rolling side to side  Gave both [facility] main # and MD on call # prior to instructions.   No egg or soy allergy known to patient   No issues known to pt with past sedation with any surgeries or procedures  Pt denies having issues being intubated  Patient denies ever being intubated  Pt has no issues moving head neck or swallowing  No FH of Malignant Hyperthermia  Pt is not on diet pills or shots  Pt is not on home 02   Pt is not on blood thinners   Pt denies issues with constipation   Pt has frequent issues with constipation RN instructed pt to use Miralax per bottles instructions a week before prep days. Pt states they will  Pt is not on dialysis  Pt denise any abnormal heart rhythms   Pt denies any upcoming cardiac testing  Pt encouraged to use to use Singlecare or Goodrx to reduce cost   Patient's chart reviewed by Cathlyn Parsons CNRA prior to pre-visit and patient appropriate for the [facility].  Pre-visit completed and red dot placed by patient's name on their procedure day (on provider's schedule).  .  Visit by phone  Pt states weight is   Instructed pt why it is important to and  to call if they have any changes in health or new medications. Directed them to the # given and on instructions.     Instructions reviewed with pt and pt states understanding. Instructed to review again prior to procedure. Pt states they will.   Instructions sent by mail with coupon and by my chart

## 2023-10-07 NOTE — Telephone Encounter (Signed)
Attempt to reach pt for pre-visit. LM with call back #.   Will attempt other number in profile  Second attempt to reach pt for pre-vist unsuccessful. LM with facility # for pt to call back. Instructed pt to call # given by end of the day and reschedule the pre-visit  with RN or the scheduled procedure will be canceled.

## 2023-10-07 NOTE — Telephone Encounter (Signed)
Patient was rescheduled for 10/16 at 1:00 PM.

## 2023-10-15 ENCOUNTER — Encounter: Payer: Self-pay | Admitting: Gastroenterology

## 2023-10-15 ENCOUNTER — Ambulatory Visit (AMBULATORY_SURGERY_CENTER): Payer: Medicare HMO

## 2023-10-15 VITALS — Ht 64.0 in | Wt 189.0 lb

## 2023-10-15 DIAGNOSIS — Z860100 Personal history of colon polyps, unspecified: Secondary | ICD-10-CM

## 2023-10-15 MED ORDER — NA SULFATE-K SULFATE-MG SULF 17.5-3.13-1.6 GM/177ML PO SOLN: 1.0000 | Freq: Once | ORAL | 0 refills | Status: AC

## 2023-10-15 NOTE — Progress Notes (Signed)
Pre visit completed via phone call; Patient verified name, DOB, and address; No egg or soy allergy known to patient;  No issues known to pt with past sedation with any surgeries or procedures; Patient denies ever being told they had issues or difficulty with intubation;  No FH of Malignant Hyperthermia; Pt is not on diet pills; Pt is not on home 02;  Pt is not on blood thinners;  Pt denies issues with constipation;  No A fib or A flutter; Have any cardiac testing pending--NO Insurance verified during PV appt---[employer] Medicare Pt can ambulate without assistance-uses walker for balance issues related to MS; Pt denies use of chewing tobacco; Discussed diabetic/weight loss medication holds; Discussed NSAID holds; Checked BMI to be less than 50; Pt instructed to use Singlecare.com or GoodRx for a price reduction on prep;  Patient's chart reviewed by Cathlyn Parsons CNRA prior to previsit and patient appropriate for the [facility];  Pre visit completed and red dot placed by patient's name on their procedure day (on provider's schedule);  Instructions printed and placed on 2nd floor with receptionist for patient to pick up per her request;

## 2023-10-27 ENCOUNTER — Encounter: Payer: Self-pay | Admitting: Gastroenterology

## 2023-10-27 ENCOUNTER — Ambulatory Visit (AMBULATORY_SURGERY_CENTER): Payer: Medicare HMO | Admitting: Gastroenterology

## 2023-10-27 VITALS — BP 126/77 | HR 81 | Temp 98.1°F | Resp 15 | Ht 64.0 in | Wt 189.0 lb

## 2023-10-27 DIAGNOSIS — D123 Benign neoplasm of transverse colon: Secondary | ICD-10-CM

## 2023-10-27 DIAGNOSIS — Z09 Encounter for follow-up examination after completed treatment for conditions other than malignant neoplasm: Secondary | ICD-10-CM | POA: Diagnosis not present

## 2023-10-27 DIAGNOSIS — I10 Essential (primary) hypertension: Secondary | ICD-10-CM | POA: Diagnosis not present

## 2023-10-27 DIAGNOSIS — G35 Multiple sclerosis: Secondary | ICD-10-CM | POA: Diagnosis not present

## 2023-10-27 DIAGNOSIS — Z860100 Personal history of colon polyps, unspecified: Secondary | ICD-10-CM

## 2023-10-27 DIAGNOSIS — Z860101 Personal history of adenomatous and serrated colon polyps: Secondary | ICD-10-CM | POA: Diagnosis not present

## 2023-10-27 DIAGNOSIS — E039 Hypothyroidism, unspecified: Secondary | ICD-10-CM | POA: Diagnosis not present

## 2023-10-27 DIAGNOSIS — D573 Sickle-cell trait: Secondary | ICD-10-CM | POA: Diagnosis not present

## 2023-10-27 DIAGNOSIS — Z1211 Encounter for screening for malignant neoplasm of colon: Secondary | ICD-10-CM | POA: Diagnosis not present

## 2023-10-27 MED ORDER — SODIUM CHLORIDE 0.9 % IV SOLN: 500.0000 mL | INTRAVENOUS | Status: DC

## 2023-10-27 NOTE — Progress Notes (Unsigned)
[facility] History and Physical   Primary Care Physician:  McDiarmid, Leighton Roach, MD   Reason for Procedure:  History of adenomatous colon polyps  Plan:    Surveillance colonoscopy with possible interventions as needed     HPI: Mariah Jackson is a very pleasant 62 y.o. female here for surveillance colonoscopy. Denies any nausea, vomiting, abdominal pain, melena or bright red blood per rectum  The risks and benefits as well as alternatives of endoscopic procedure(s) have been discussed and reviewed. All questions answered. The patient agrees to proceed.    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 concordant by Dr. Sande Brothers, with excision recommended.  Surgical consultation has been arranged with Dr. Harriette Bouillon at [facility] on March 19, 2019.  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  - SEE ONCOLOGY TABLE.   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 Pathology report non-cancerous, fibrous tissue.     History of ductal carcinoma in situ (DCIS) of breast 03/05/2019   Excision Biopsy 2017: DUCTAL CARCINOMA IN  SITU, INTERMEDIATE GRADE WITH FOCAL  NECROSIS, 2 CM. Estrogen receptor positive  Progesterone receptor positive  8. TNM: [id number]   History of ductal carcinoma in situ (DCIS) of breast 03/05/2019   Excision Biopsy 2017: DUCTAL CARCINOMA IN SITU, INTERMEDIATE GRADE WITH FOCAL  NECROSIS, 2 CM. Estrogen receptor positive  Progesterone receptor positive  8. TNM: [id number]   Hypercalcemia, persistent    Hyperlipidemia    Hypertension    on meds   Hypothyroidism    Multinodular goiter (nontoxic) 08/29/2016   on meds   Multiple sclerosis (HCC)    Multiple sclerosis, relapsing-remitting (HCC) 09/10/2013   Personal history of radiation therapy    Pure hypercholesterolemia 11/09/2014   on meds   Sickle cell trait (HCC)    Thyroid enlargement 11/09/2014   Right sided thyroid enlargement      Past Surgical History:  Procedure Laterality Date   BREAST BIOPSY Right 03/12/2019   atypical ductal hyperplasia complex sclerosing lesion   BREAST LUMPECTOMY  2007   left, radiation   COLONOSCOPY  2014   RK-MAC-suprep(exc)-TA x1 recall 5 years   MM BREAST STEREO BX*L*R/S Right 03/12/2019   RIGHT BREAST STEREOTACTIC CORE NEEDLE BIOPSY   TUBAL LIGATION  1985    Prior to Admission medications   Medication Sig Start Date End Date Taking? Authorizing Provider  amLODipine (NORVASC) 10 MG tablet Take 1 tablet (10 mg total) by mouth daily. 01/24/23   McDiarmid, Leighton Roach, MD  celecoxib (CELEBREX) 200 MG capsule Take 1 capsule (200 mg total) by mouth 2 (two) times daily. 03/14/23  McDiarmid, Leighton Roach, MD  cholecalciferol (VITAMIN D3) 25 MCG (1000 UNIT) tablet Take 1,000 Units by mouth daily. Patient not taking: Reported on 10/15/2023    [provider]  Cyanocobalamin (VITAMIN B-12 PO) Take 1 tablet by mouth daily at 6 (six) AM.    [provider]  Dimethyl Fumarate 240 MG CPDR Take 1 capsule (240 mg total) by mouth in the morning and at bedtime. 06/03/23   Glean Salvo, NP  Inulin (FIBER  CHOICE PO) Take 1 capsule by mouth daily at 6 (six) AM.    [provider]  levothyroxine (SYNTHROID) 100 MCG tablet Take 1 tablet (100 mcg total) by mouth daily. 05/02/23   McDiarmid, Leighton Roach, MD  Multiple Vitamin (MULTIVITAMIN) tablet Take 1 tablet by mouth daily.    [provider]  omeprazole (PRILOSEC) 20 MG capsule Take 1 capsule (20 mg total) by mouth daily. 03/26/23   McDiarmid, Leighton Roach, MD  rosuvastatin (CRESTOR) 10 MG tablet Take 1 tablet (10 mg total) by mouth daily. 01/24/23 01/19/24  McDiarmid, Leighton Roach, MD  spironolactone-hydrochlorothiazide (ALDACTAZIDE) 25-25 MG tablet TAKE 1 TABLET EVERY DAY (APPOINTMENT IS NEEDED) 05/20/23   McDiarmid, Leighton Roach, MD  TECFIDERA 240 MG CPDR TAKE ONE CAPSULE BY MOUTH TWICE DAILY. STORE IN ORIGINAL CONTAINER AT ROOM TEMPERATURE. 01/02/23   Levert Feinstein, MD  tiZANidine (ZANAFLEX) 4 MG tablet TAKE 1 TABLET EVERY 8 HOURS AS NEEDED FOR MUSCLE SPASM(S) 06/02/23   Glean Salvo, NP  valsartan (DIOVAN) 80 MG tablet Take 1 tablet (80 mg total) by mouth at bedtime. 01/24/23   McDiarmid, Leighton Roach, MD    Current Outpatient Medications  Medication Sig Dispense Refill   amLODipine (NORVASC) 10 MG tablet Take 1 tablet (10 mg total) by mouth daily. 90 tablet 3   celecoxib (CELEBREX) 200 MG capsule Take 1 capsule (200 mg total) by mouth 2 (two) times daily. 180 capsule 3   cholecalciferol (VITAMIN D3) 25 MCG (1000 UNIT) tablet Take 1,000 Units by mouth daily. (Patient not taking: Reported on 10/15/2023)     Cyanocobalamin (VITAMIN B-12 PO) Take 1 tablet by mouth daily at 6 (six) AM.     Dimethyl Fumarate 240 MG CPDR Take 1 capsule (240 mg total) by mouth in the morning and at bedtime. 180 capsule 3   Inulin (FIBER CHOICE PO) Take 1 capsule by mouth daily at 6 (six) AM.     levothyroxine (SYNTHROID) 100 MCG tablet Take 1 tablet (100 mcg total) by mouth daily. 90 tablet 3   Multiple Vitamin (MULTIVITAMIN) tablet Take 1 tablet by mouth daily.     omeprazole (PRILOSEC)  20 MG capsule Take 1 capsule (20 mg total) by mouth daily. 90 capsule 3   rosuvastatin (CRESTOR) 10 MG tablet Take 1 tablet (10 mg total) by mouth daily. 90 tablet 3   spironolactone-hydrochlorothiazide (ALDACTAZIDE) 25-25 MG tablet TAKE 1 TABLET EVERY DAY (APPOINTMENT IS NEEDED) 90 tablet 3   TECFIDERA 240 MG CPDR TAKE ONE CAPSULE BY MOUTH TWICE DAILY. STORE IN ORIGINAL CONTAINER AT ROOM TEMPERATURE. 180 capsule 3   tiZANidine (ZANAFLEX) 4 MG tablet TAKE 1 TABLET EVERY 8 HOURS AS NEEDED FOR MUSCLE SPASM(S) 90 tablet 11   valsartan (DIOVAN) 80 MG tablet Take 1 tablet (80 mg total) by mouth at bedtime. 90 tablet 3   No current facility-administered medications for this visit.    Allergies as of 10/27/2023 - Review Complete 10/15/2023  Allergen Reaction Noted   Hydrocodone-acetaminophen Nausea Only 06/30/2023   Vicodin [  hydrocodone-acetaminophen] Nausea Only 04/23/2013    Family History  Problem Relation Age of Onset   Hypertension Mother    Pneumonia Mother    Stroke Father    Breast cancer Sister 33   Hypertension Sister    Hypertension Sister    Hypertension Brother    Diabetes Brother 51       Prediabetic   Hypertension Brother    Hypertension Brother    Hypertension Brother    Hypertension Brother    Colon cancer Neg Hx    Colitis Neg Hx    Colon polyps Neg Hx    Esophageal cancer Neg Hx    Stomach cancer Neg Hx    Rectal cancer Neg Hx     Social History   Socioeconomic History   Marital status: Widowed    Spouse name: Not on file   Number of children: 2   Years of education: 61   Highest education level: 12th grade  Occupational History   Occupation: Unemployed    Occupation: Disabiliy  Tobacco Use   Smoking status: Never   Smokeless tobacco: Never   Tobacco comments:    no plans to start  Vaping Use   Vaping status: Never Used  Substance and Sexual Activity   Alcohol use: No   Drug use: No   Sexual activity: Not Currently  Other Topics Concern   Not  on file  Social History Narrative   Patient a high school education.    Patient is unemployed.   Patient has 2 children.  Patient lives in her home with her brother, her son and DIL. One story home, ramp at back, 3-4 steps in front. Has grab bar at tub but not toilet. Pets: 2 dogs, Miss Honey and Miss Spain.   Smoke alarms in home. Carpet in home, bath mats in bathroom.  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    Patient is divorced.    Eats small meals during day. Meat, veggies, fruit. Has trouble staying asleep. Wakes up about 4 am after going to bed at 1030 pm.   Mobility:  Cane or walker   Transportation: SCAT or family member   Caregiver: daughter    Emergency Contact: daughter, Cleotis Nipper, CNA   Advance Directive:  None   Resources:   -Financial payment plan with the neurology office.   -MS Society   -Research program participate for MS which covers some medical expense for patient.         DME: cane, walker, BP monitor (digital), partial plate (upper), eyeglasses, ankle brace   Social Determinants of Health   Financial Resource Strain: Low Risk  (02/07/2023)   Overall Financial Resource Strain (CARDIA)    Difficulty of Paying Living Expenses: Not hard at all  Food Insecurity: No Food Insecurity (02/07/2023)   Hunger Vital Sign    Worried About Running Out of Food in the Last Year: Never true    Ran Out of Food in the Last Year: Never true  Transportation Needs: No Transportation Needs (02/07/2023)   PRAPARE - Administrator, Civil Service (Medical): No    Lack of Transportation (Non-Medical): No  Physical Activity: Insufficiently Active (02/07/2023)   Exercise Vital Sign    Days of Exercise per Week: 7 days    Minutes of Exercise per Session: 10 min  Stress: No Stress Concern Present (02/07/2023)   [employer] of Occupational Health - Occupational  Stress Questionnaire    Feeling of Stress : Not at all   Social Connections: Moderately Integrated (02/07/2023)   Social Connection and Isolation Panel [NHANES]    Frequency of Communication with Friends and Family: More than three times a week    Frequency of Social Gatherings with Friends and Family: More than three times a week    Attends Religious Services: More than 4 times per year    Active Member of [employer] or Organizations: Yes    Attends Banker Meetings: Not on file    Marital Status: Widowed  Intimate Partner Violence: Not At Risk (02/07/2023)   Humiliation, Afraid, Rape, and Kick questionnaire    Fear of Current or Ex-Partner: No    Emotionally Abused: No    Physically Abused: No    Sexually Abused: No    Review of Systems:  All other review of systems negative except as mentioned in the HPI.  Physical Exam: Vital signs in last 24 hours: There were no vitals taken for this visit. General:   Alert, NAD Lungs:  Clear .   Heart:  Regular rate and rhythm Abdomen:  Soft, nontender and nondistended. Neuro/Psych:  Alert and cooperative. Normal mood and affect. A and O x 3  Reviewed labs, radiology imaging, old records and pertinent past GI work up  Patient is appropriate for planned procedure(s) and anesthesia in an ambulatory setting   K. Scherry Ran , MD [phone]

## 2023-10-27 NOTE — Patient Instructions (Signed)
Handout on diverticulosis, polyps, and hemorrhoids given.  Continue present medications and resume normal diet.  Awaiting pathology report.   YOU HAD AN ENDOSCOPIC PROCEDURE TODAY AT THE [facility]:   Refer to the procedure report that was given to you for any specific questions about what was found during the examination.  If the procedure report does not answer your questions, please call your gastroenterologist to clarify.  If you requested that your care partner not be given the details of your procedure findings, then the procedure report has been included in a sealed envelope for you to review at your convenience later.  YOU SHOULD EXPECT: Some feelings of bloating in the abdomen. Passage of more gas than usual.  Walking can help get rid of the air that was put into your GI tract during the procedure and reduce the bloating. If you had a lower endoscopy (such as a colonoscopy or flexible sigmoidoscopy) you may notice spotting of blood in your stool or on the toilet paper. If you underwent a bowel prep for your procedure, you may not have a normal bowel movement for a few days.  Please Note:  You might notice some irritation and congestion in your nose or some drainage.  This is from the oxygen used during your procedure.  There is no need for concern and it should clear up in a day or so.  SYMPTOMS TO REPORT IMMEDIATELY:  Following lower endoscopy (colonoscopy or flexible sigmoidoscopy):  Excessive amounts of blood in the stool  Significant tenderness or worsening of abdominal pains  Swelling of the abdomen that is new, acute  Fever of 100F or higher   For urgent or emergent issues, a gastroenterologist can be reached at any hour by calling [phone]. Do not use MyChart messaging for urgent concerns.    DIET:  We do recommend a small meal at first, but then you may proceed to your regular diet.  Drink plenty of fluids but you should avoid alcoholic beverages for 24  hours.  ACTIVITY:  You should plan to take it easy for the rest of today and you should NOT DRIVE or use heavy machinery until tomorrow (because of the sedation medicines used during the test).    FOLLOW UP: Our staff will call the number listed on your records the next business day following your procedure.  We will call around 7:15- 8:00 am to check on you and address any questions or concerns that you may have regarding the information given to you following your procedure. If we do not reach you, we will leave a message.     If any biopsies were taken you will be contacted by phone or by letter within the next 1-3 weeks.  Please call us at [phone] if you have not heard about the biopsies in 3 weeks.    SIGNATURES/CONFIDENTIALITY: You and/or your care partner have signed paperwork which will be entered into your electronic medical record.  These signatures attest to the fact that that the information above on your After Visit Summary has been reviewed and is understood.  Full responsibility of the confidentiality of this discharge information lies with you and/or your care-partner.

## 2023-10-27 NOTE — Progress Notes (Unsigned)
Called to room to assist during endoscopic procedure.  Patient ID and intended procedure confirmed with present staff. Received instructions for my participation in the procedure from the performing physician.  

## 2023-10-27 NOTE — Op Note (Signed)
[facility] Patient Name: Mariah Jackson Procedure Date: 10/27/2023 10:50 AM MRN: [id number] Endoscopist: Napoleon Form , MD, [id number] Age: 62 Referring MD:  Date of Birth: [birth year] Gender: Female Account #: [id number] Procedure:                Colonoscopy Indications:              High risk colon cancer surveillance: Personal                            history of colonic polyps, High risk colon cancer                            surveillance: Personal history of adenoma less than                            10 mm in size Medicines:                Monitored Anesthesia Care Procedure:                Pre-Anesthesia Assessment:                           - Prior to the procedure, a History and Physical                            was performed, and patient medications and                            allergies were reviewed. The patient's tolerance of                            previous anesthesia was also reviewed. The risks                            and benefits of the procedure and the sedation                            options and risks were discussed with the patient.                            All questions were answered, and informed consent                            was obtained. Prior Anticoagulants: The patient has                            taken no anticoagulant or antiplatelet agents. ASA                            Grade Assessment: II - A patient with mild systemic                            disease. After reviewing the risks and benefits,  the patient was deemed in satisfactory condition to                            undergo the procedure.                           After obtaining informed consent, the colonoscope                            was passed under direct vision. Throughout the                            procedure, the patient's blood pressure, pulse, and                            oxygen saturations were monitored  continuously. The                            Olympus Scope [id number] was introduced through the                            anus and advanced to the the cecum, identified by                            appendiceal orifice and ileocecal valve. The                            colonoscopy was performed without difficulty. The                            patient tolerated the procedure well. The quality                            of the bowel preparation was good. The ileocecal                            valve, appendiceal orifice, and rectum were                            photographed. Scope In: 10:57:40 AM Scope Out: 11:09:41 AM Scope Withdrawal Time: 0 hours 8 minutes 51 seconds  Total Procedure Duration: 0 hours 12 minutes 1 second  Findings:                 The perianal and digital rectal examinations were                            normal.                           A 4 mm polyp was found in the transverse colon. The                            polyp was sessile. The polyp was removed with a  cold snare. Resection and retrieval were complete.                           Scattered medium-mouthed and small-mouthed                            diverticula were found in the sigmoid colon and                            descending colon. Peri-diverticular erythema was                            seen.                           Non-bleeding external and internal hemorrhoids were                            found during retroflexion. The hemorrhoids were                            medium-sized. Complications:            No immediate complications. Estimated Blood Loss:     Estimated blood loss was minimal. Impression:               - One 4 mm polyp in the transverse colon, removed                            with a cold snare. Resected and retrieved.                           - Moderate diverticulosis in the sigmoid colon and                            in the descending colon.  Peri-diverticular erythema                            was seen.                           - Non-bleeding external and internal hemorrhoids. Recommendation:           - Patient has a contact number available for                            emergencies. The signs and symptoms of potential                            delayed complications were discussed with the                            patient. Return to normal activities tomorrow.                            Written discharge instructions were provided to the  patient.                           - Resume previous diet.                           - Continue present medications.                           - Await pathology results.                           - Repeat colonoscopy in 5-10 years for surveillance                            based on pathology results. Napoleon Form, MD 10/27/2023 11:15:05 AM This report has been signed electronically.

## 2023-10-27 NOTE — Progress Notes (Signed)
Pt resting comfortably. VSS. Airway intact. SBAR complete to RN. All questions answered.   

## 2023-10-28 ENCOUNTER — Telehealth: Payer: Self-pay | Admitting: *Deleted

## 2023-10-28 NOTE — Telephone Encounter (Signed)
  Follow up Call-     10/27/2023   10:25 AM  Call back number  Post procedure Call Back phone  # [phone]  Permission to leave phone message Yes     Patient questions:  Do you have a fever, pain , or abdominal swelling? No. Pain Score  0 *  Have you tolerated food without any problems? Yes.    Have you been able to return to your normal activities? Yes.    Do you have any questions about your discharge instructions: Diet   No. Medications  No. Follow up visit  No.  Do you have questions or concerns about your Care? No.  Actions: * If pain score is 4 or above: No action needed, pain <4.

## 2023-10-29 LAB — SURGICAL PATHOLOGY

## 2023-11-03 ENCOUNTER — Encounter: Payer: Self-pay | Admitting: Gastroenterology

## 2023-11-19 ENCOUNTER — Other Ambulatory Visit: Payer: Medicare HMO

## 2024-01-01 ENCOUNTER — Other Ambulatory Visit: Payer: Self-pay | Admitting: Family Medicine

## 2024-01-01 DIAGNOSIS — M25569 Pain in unspecified knee: Secondary | ICD-10-CM

## 2024-02-04 ENCOUNTER — Other Ambulatory Visit: Payer: Self-pay | Admitting: Family Medicine

## 2024-02-04 DIAGNOSIS — I10 Essential (primary) hypertension: Secondary | ICD-10-CM

## 2024-02-04 DIAGNOSIS — E7800 Pure hypercholesterolemia, unspecified: Secondary | ICD-10-CM

## 2024-02-04 DIAGNOSIS — K219 Gastro-esophageal reflux disease without esophagitis: Secondary | ICD-10-CM

## 2024-02-05 ENCOUNTER — Ambulatory Visit (INDEPENDENT_AMBULATORY_CARE_PROVIDER_SITE_OTHER): Payer: Medicare HMO | Admitting: Family Medicine

## 2024-02-05 ENCOUNTER — Encounter: Payer: Self-pay | Admitting: Family Medicine

## 2024-02-05 VITALS — BP 130/64 | HR 99 | Ht 64.0 in | Wt 193.1 lb

## 2024-02-05 DIAGNOSIS — R269 Unspecified abnormalities of gait and mobility: Secondary | ICD-10-CM

## 2024-02-05 DIAGNOSIS — Z23 Encounter for immunization: Secondary | ICD-10-CM | POA: Diagnosis not present

## 2024-02-05 DIAGNOSIS — E7800 Pure hypercholesterolemia, unspecified: Secondary | ICD-10-CM

## 2024-02-05 DIAGNOSIS — Z9189 Other specified personal risk factors, not elsewhere classified: Secondary | ICD-10-CM | POA: Diagnosis not present

## 2024-02-05 DIAGNOSIS — Z7409 Other reduced mobility: Secondary | ICD-10-CM

## 2024-02-05 DIAGNOSIS — I10 Essential (primary) hypertension: Secondary | ICD-10-CM | POA: Diagnosis not present

## 2024-02-05 DIAGNOSIS — Z789 Other specified health status: Secondary | ICD-10-CM

## 2024-02-05 DIAGNOSIS — E210 Primary hyperparathyroidism: Secondary | ICD-10-CM

## 2024-02-05 LAB — POCT GLYCOSYLATED HEMOGLOBIN (HGB A1C): HbA1c, POC (prediabetic range): 5.7 % (ref 5.7–6.4)

## 2024-02-05 NOTE — Patient Instructions (Signed)
 We are checking your  calcium , parathyroid hormon, and A1c (sugar).   Dr Mystic Labo will let you know it they are abnormal.  If they are normal, he will send you a letter with the results.

## 2024-02-06 ENCOUNTER — Encounter: Payer: Self-pay | Admitting: Family Medicine

## 2024-02-06 LAB — CA+CREAT+P+PTH INTACT
Calcium: 11 mg/dL — ABNORMAL HIGH (ref 8.7–10.3)
Creatinine, Ser: 1.03 mg/dL — ABNORMAL HIGH (ref 0.57–1.00)
PTH: 62 pg/mL (ref 15–65)
Phosphorus: 2.9 mg/dL — ABNORMAL LOW (ref 3.0–4.3)
eGFR: 60 mL/min/{1.73_m2} (ref >59)

## 2024-02-06 NOTE — Assessment & Plan Note (Signed)
 Established problem Tolerating statin Continue rosuvastatin  10 mg daily

## 2024-02-06 NOTE — Assessment & Plan Note (Addendum)
.  Basic Metabolic Panel:    Component Value Date/Time   NA 141 06/03/2023 1114   K 3.9 06/03/2023 1114   CL 104 06/03/2023 1114   CO2 25 06/03/2023 1114   BUN 12 06/03/2023 1114   CREATININE 1.03 (H) 02/05/2024 1051   CREATININE 0.82 08/29/2016 1227   GLUCOSE 120 (H) 06/03/2023 1114   GLUCOSE 91 08/29/2016 1227   CALCIUM  11.0 (H) 02/05/2024 1051   Asymptomatic Awaiting iPTH result  Patient taking Dyazide which may raise serum calcium  some.  As patient remains asymptomatic, will continue for now.

## 2024-02-06 NOTE — Assessment & Plan Note (Signed)
 Established problem. Adequate blood pressure control.  Dyazide may contribute to calcium  elevation in primary hyperparathyroidism but asymptomatic so will continue it for now since it appears to be helping with SBP..  Tolerating medication without significant adverse effects.  Plan to continue current blood pressure medication regiment.

## 2024-02-06 NOTE — Assessment & Plan Note (Signed)
 Lab Results  Component Value Date   HGBA1C 5.7 02/05/2024  [name] remains at risk for diabetes.  Lifestyle changes.

## 2024-02-06 NOTE — Progress Notes (Addendum)
 Mariah Jackson is alone Sources of clinical information for visit is/are patient. Nursing assessment for this office visit was reviewed with the patient for accuracy and revision.     Previous Report(s) Reviewed: none     02/05/2024   10:07 AM  Depression screen PHQ 2/9  Decreased Interest 0  Down, Depressed, Hopeless 0  PHQ - 2 Score 0  Altered sleeping 0  Tired, decreased energy 0  Change in appetite 0  Feeling bad or failure about yourself  0  Trouble concentrating 0  Moving slowly or fidgety/restless 0  PHQ-9 Score 0   Flowsheet Row Office Visit from 02/05/2024 in [facility] - A Dept Of [place]. [facility] Office Visit from 05/01/2023 in [facility] - A Dept Of [place]. [facility] Office Visit from 05[facility]30/2022 in [facility] - A Dept Of [place]. [facility]  Thoughts that you would be better off dead, or of hurting yourself in some way -- Not at all Not at all  PHQ-9 Total Score 0 0 0          02/05/2024   10:07 AM 02/07/2023    9:08 AM 02/10/2020    9:19 AM 03/04/2019   10:09 AM 10/08/2018   10:21 AM  Fall Risk   Falls in the past year? 0 0 0 0 Yes  Number falls in past yr: 0 0   1  Injury with Fall? 0 0   No  Risk for fall due to :  No Fall Risks   Impaired balance/gait;Impaired mobility  Follow up  Falls evaluation completed   Falls prevention discussed       02/05/2024   10:07 AM 05/01/2023    9:58 AM 02/07/2023    9:07 AM  PHQ9 SCORE ONLY  PHQ-9 Total Score 0 0 0    There are no preventive care reminders to display for this patient.  Health Maintenance Due  Topic Date Due   Zoster Vaccines- Shingrix (1 of 2) Never done   COVID-19 Vaccine (4 - 2024-25 season) 08/31/2023   Cervical Cancer Screening (HPV/Pap Cotest)  10/17/2023   DEXA SCAN  01/22/2024   Medicare Annual Wellness (AWV)  02/08/2024   Pneumonia Vaccine 28+ Years old (1 of 1 - PCV) 01/22/2024      History/P.E. limitations:  none  There are no preventive care reminders to display for this patient. There are no preventive care reminders to display for this patient.  Health Maintenance Due  Topic Date Due   Zoster Vaccines- Shingrix (1 of 2) Never done   COVID-19 Vaccine (4 - 2024-25 season) 08/31/2023   Cervical Cancer Screening (HPV/Pap Cotest)  10/17/2023   DEXA SCAN  01/22/2024   Medicare Annual Wellness (AWV)  02/08/2024   Pneumonia Vaccine 55+ Years old (1 of 1 - PCV) 01/22/2024     Chief Complaint  Patient presents with   Medical Management of Chronic Issues    Bp     --------------------------------------------------------------------------------------------------------------------------------------------- Visit Problem List with A/P  Primary hyperparathyroidism (HCC) .Basic Metabolic Panel:    Component Value Date/Time   NA 141 06/03/2023 1114   K 3.9 06/03/2023 1114   CL 104 06/03/2023 1114   CO2 25 06/03/2023 1114   BUN 12 06/03/2023 1114   CREATININE 1.03 (H) 02/05/2024 1051   CREATININE 0.82 08/29/2016 1227   GLUCOSE 120 (H) 06/03/2023 1114   GLUCOSE 91 08/29/2016 1227   CALCIUM   11.0 (H) 02/05/2024 1051   Asymptomatic Awaiting iPTH result  Patient taking Dyazide which may raise serum calcium  some.  As patient remains asymptomatic, will continue for now.      Pure hypercholesterolemia Established problem Tolerating statin Continue rosuvastatin  10 mg daily  Essential hypertension Established problem. Adequate blood pressure control.  Dyazide may contribute to calcium  elevation in primary hyperparathyroidism but asymptomatic so will continue it for now since it appears to be helping with SBP..  Tolerating medication without significant adverse effects.  Plan to continue current blood pressure medication regiment.    At risk for diabetes mellitus Lab Results  Component Value Date   HGBA1C 5.7 02/05/2024  [name] remains at risk for diabetes.  Lifestyle changes.   Gait  abnormality [name] has a mobility limitation that significantly impairs his or her ability to participate in one or more mobility-related activities of daily living (MRADLs); she is at heighten risk of morbidity and mortality secondary to her attempts to perform her MDARLs.  The MRADLs to be considered in this and all other statements in this policy are toileting, feeding, dressing, grooming, and bathing. A mobility limitation is one that:    The beneficiary is unable to safely a cane or crutch.  Her functional mobility deficit is not be sufficiently resolved by the use of a cane or crutch.   [name] has demonstrated ability to safely use a 4-wheeled rolling walker with seat, e.g., Rollator Walker.   In my opinion, [name]'s functional mobility deficit can be sufficiently resolved with her use of a 4-wheeled rolling walker with seat.     Impaired mobility and ADLs See Gait Abnormality problem listed in note.

## 2024-02-16 ENCOUNTER — Telehealth: Payer: Self-pay

## 2024-02-16 DIAGNOSIS — Z789 Other specified health status: Secondary | ICD-10-CM | POA: Insufficient documentation

## 2024-02-16 NOTE — Addendum Note (Signed)
Addended byPerley Jain, Dianara Smullen D on: 02/16/2024 12:01 PM   Modules accepted: Orders

## 2024-02-16 NOTE — Assessment & Plan Note (Signed)
Mariah Jackson has a mobility limitation that significantly impairs his or her ability to participate in one or more mobility-related activities of daily living (MRADLs); she is at heighten risk of morbidity and mortality secondary to her attempts to perform her MDARLs.  The MRADLs to be considered in this and all other statements in this policy are toileting, feeding, dressing, grooming, and bathing. A mobility limitation is one that:    The beneficiary is unable to safely a cane or crutch.  Her functional mobility deficit is not be sufficiently resolved by the use of a cane or crutch.   Mariah Jackson has demonstrated ability to safely use a 4-wheeled rolling walker with seat, e.g., Rollator Walker.   In my opinion, Mariah Jackson's functional mobility deficit can be sufficiently resolved with her use of a 4-wheeled rolling walker with seat.

## 2024-02-16 NOTE — Telephone Encounter (Signed)
 Community message sent to [employer]. Will await response.   Veronda Prude, RN

## 2024-02-16 NOTE — Telephone Encounter (Signed)
Rollator order placed Documentation supporting order for Rollator addended to patient's 02/06/24 office visit note.

## 2024-02-16 NOTE — Assessment & Plan Note (Signed)
See Gait Abnormality problem listed in note.

## 2024-02-16 NOTE — Telephone Encounter (Signed)
Patient calls nurse line requesting order for Rollator walker.   She reports that she discussed this at last visit with Dr. McDiarmid. States that she was going to attempt getting this equipment from the MS society, however, they are unable to assist her due to lack of funding.   She is now asking if PCP can place order.   Insurance will require documentation in office visit of medical necessity. Will forward to PCP to see if he would be able to addend note from 02/05/24 to include this documentation and place order.   Veronda Prude, RN

## 2024-02-25 NOTE — Telephone Encounter (Signed)
 Receipt confirmed by [employer].   Veronda Prude, RN

## 2024-03-12 ENCOUNTER — Ambulatory Visit: Payer: Medicare HMO | Admitting: Family Medicine

## 2024-03-12 VITALS — BP 145/87 | HR 83 | Ht 64.0 in | Wt 192.0 lb

## 2024-03-12 DIAGNOSIS — Z0000 Encounter for general adult medical examination without abnormal findings: Secondary | ICD-10-CM

## 2024-03-12 NOTE — Progress Notes (Signed)
 I have reviewed the patient's updated history, problem list, medications and allergies. I have reviewed the AWV provider's notations.

## 2024-03-12 NOTE — Progress Notes (Signed)
 Subjective:   Mariah Jackson is a 63 y.o. who presents for a Medicare Wellness preventive visit.  Visit Complete: Virtual I connected with  Mariah Jackson on 03/12/24 by a audio enabled telemedicine application and verified that I am speaking with the correct person using two identifiers.  Patient Location: Home  Provider Location: Home Office  I discussed the limitations of evaluation and management by telemedicine. The patient expressed understanding and agreed to proceed.  Vital Signs: Because this visit was a virtual/telehealth visit, some criteria may be missing or patient reported. Any vitals not documented were not able to be obtained and vitals that have been documented are patient reported.  VideoDeclined- This patient declined Librarian, academic. Therefore the visit was completed with audio only.  Persons Participating in Visit: Patient.  AWV Questionnaire: No: Patient Medicare AWV questionnaire was not completed prior to this visit.  Cardiac Risk Factors include: dyslipidemia;advanced age (>59men, >62 women);obesity (BMI >30kg/m2);hypertension     Objective:    Today's Vitals   03/12/24 0905  BP: (!) 145/87  Pulse: 83  Weight: 192 lb (87.1 kg)  Height: 5\' 4"  (1.626 m)  PainSc: 0-No pain   Body mass index is 32.96 kg/m.     03/12/2024    9:35 AM 03/12/2024    9:33 AM 02/05/2024   10:06 AM 05/01/2023    9:55 AM 02/07/2023    9:27 AM 11/04/2022    8:43 AM 10/24/2022    8:42 AM  Advanced Directives  Does Patient Have a Medical Advance Directive? No No No No No No No  Would patient like information on creating a medical advance directive? Yes (MAU/Ambulatory/Procedural Areas - Information given) -- No - Patient declined No - Patient declined Yes (MAU/Ambulatory/Procedural Areas - Information given)  No - Patient declined    Current Medications (verified) Outpatient Encounter Medications as of 03/12/2024  Medication Sig   amLODipine (NORVASC)  10 MG tablet TAKE 1 TABLET EVERY DAY   celecoxib (CELEBREX) 200 MG capsule TAKE 1 CAPSULE TWICE DAILY   cholecalciferol (VITAMIN D3) 25 MCG (1000 UNIT) tablet Take 1,000 Units by mouth daily.   Cyanocobalamin (VITAMIN B-12 PO) Take 1 tablet by mouth daily at 6 (six) AM.   Dimethyl Fumarate 240 MG CPDR Take 1 capsule (240 mg total) by mouth in the morning and at bedtime.   Inulin (FIBER CHOICE PO) Take 1 capsule by mouth daily at 6 (six) AM.   levothyroxine (SYNTHROID) 100 MCG tablet Take 1 tablet (100 mcg total) by mouth daily.   Multiple Vitamin (MULTIVITAMIN) tablet Take 1 tablet by mouth daily.   omeprazole (PRILOSEC) 20 MG capsule TAKE 1 CAPSULE EVERY DAY   rosuvastatin (CRESTOR) 10 MG tablet TAKE 1 TABLET EVERY DAY   spironolactone-hydrochlorothiazide (ALDACTAZIDE) 25-25 MG tablet TAKE 1 TABLET EVERY DAY (APPOINTMENT IS NEEDED)   TECFIDERA 240 MG CPDR TAKE ONE CAPSULE BY MOUTH TWICE DAILY. STORE IN ORIGINAL CONTAINER AT ROOM TEMPERATURE.   tiZANidine (ZANAFLEX) 4 MG tablet TAKE 1 TABLET EVERY 8 HOURS AS NEEDED FOR MUSCLE SPASM(S)   valsartan (DIOVAN) 80 MG tablet TAKE 1 TABLET AT BEDTIME   zinc gluconate 50 MG tablet Take 50 mg by mouth daily. Take it every other day   No facility-administered encounter medications on file as of 03/12/2024.    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 concordant by Dr. Sande Brothers, with excision recommended.  Surgical consultation has been arranged with Dr. Harriette Bouillon at [facility] on March 19, 2019.  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  - SEE ONCOLOGY TABLE.   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 Pathology report non-cancerous, fibrous tissue.     History of ductal carcinoma in situ (DCIS) of breast 03/05/2019   Excision Biopsy 2017: DUCTAL CARCINOMA IN SITU, INTERMEDIATE GRADE WITH FOCAL  NECROSIS, 2 CM. Estrogen receptor positive  Progesterone receptor positive  8. TNM: pTispNXpMX   History of ductal carcinoma in situ (DCIS) of breast 03/05/2019   Excision Biopsy 2017: DUCTAL CARCINOMA IN SITU, INTERMEDIATE GRADE WITH FOCAL  NECROSIS, 2 CM. Estrogen receptor positive  Progesterone receptor positive  8. TNM: pTispNXpMX   Hypercalcemia, persistent    Hyperextension injury of knee, right, initial encounter 10/24/2022   Hyperlipidemia    Hypertension    on meds   Hypothyroidism    Multinodular goiter (nontoxic) 08/29/2016   on meds   Multiple sclerosis (HCC)    Multiple sclerosis, relapsing-remitting (HCC) 09/10/2013   Personal history of radiation therapy    Pure hypercholesterolemia 11/09/2014   on meds   Sickle cell trait (HCC)    Thyroid enlargement 11/09/2014   Right sided thyroid enlargement     Past Surgical History:  Procedure Laterality Date   BREAST BIOPSY Right 03/12/2019   atypical ductal hyperplasia complex sclerosing lesion   BREAST LUMPECTOMY  2007   left, radiation   COLONOSCOPY  2014   RK-MAC-suprep(exc)-TA x1 recall 5 years   MM BREAST  STEREO BX*L*R/S Right 03/12/2019   RIGHT BREAST STEREOTACTIC CORE NEEDLE BIOPSY   TUBAL LIGATION  1985   Family History  Problem Relation Age of Onset   Hypertension Mother    Pneumonia Mother    Stroke Father    Breast cancer Sister 21   Hypertension Sister    Hypertension Sister    Hypertension Brother    Diabetes Brother 4       Prediabetic   Hypertension Brother    Hypertension Brother    Hypertension Brother    Hypertension Brother    Colon cancer Neg Hx    Colitis Neg Hx    Colon polyps Neg Hx    Esophageal cancer Neg Hx    Stomach cancer Neg Hx    Rectal cancer Neg Hx    Social History   Socioeconomic History   Marital status: Widowed    Spouse name: Not on file   Number of children: 2   Years of education: 6   Highest education level: 12th grade  Occupational History   Occupation: Unemployed    Occupation:  Disabiliy  Tobacco Use   Smoking status: Never   Smokeless tobacco: Never   Tobacco comments:    no plans to start  Vaping Use   Vaping status: Never Used  Substance and Sexual Activity   Alcohol use: No   Drug use: No   Sexual activity: Not Currently  Other Topics Concern   Not on file  Social History Narrative   Patient a high school education.    Patient is unemployed.   Patient has 2 children.  Patient lives in her home with her brother, her son and DIL. One story home, ramp at back, 3-4 steps in front. Has grab bar at tub but not toilet. Pets: 2 dogs, Miss Honey and Miss [place].   Smoke alarms in home. Carpet in home, bath mats in bathroom.  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    Patient is divorced.    Eats small meals during day. Meat, veggies, fruit. Has trouble staying asleep. Wakes up about 4 am after going to bed at 1030 pm.   Mobility:  Cane or walker   Transportation: SCAT or family member   Caregiver: daughter    Emergency Contact: daughter, Cleotis Nipper, CNA   Advance  Directive:  None   Resources:   -Financial payment plan with the neurology office.   -MS Society   -Research program participate for MS which covers some medical expense for patient.         DME: cane, walker, BP monitor (digital), partial plate (upper), eyeglasses, ankle brace   Social Drivers of Health   Financial Resource Strain: Low Risk  (03/12/2024)   Overall Financial Resource Strain (CARDIA)    Difficulty of Paying Living Expenses: Not hard at all  Food Insecurity: No Food Insecurity (03/12/2024)   Hunger Vital Sign    Worried About Running Out of Food in the Last Year: Never true    Ran Out of Food in the Last Year: Never true  Transportation Needs: No Transportation Needs (03/12/2024)   PRAPARE - Administrator, Civil Service (Medical): No    Lack of Transportation (Non-Medical): No  Physical Activity: Insufficiently Active (03/12/2024)   Exercise Vital Sign    Days of Exercise per Week: 7 days    Minutes of Exercise per Session: 20 min  Stress: No Stress Concern Present (03/12/2024)   [employer] of Occupational Health - Occupational Stress Questionnaire    Feeling of Stress : Not at all  Social Connections: Moderately Integrated (03/12/2024)   Social Connection and Isolation Panel [NHANES]    Frequency of Communication with Friends and Family: More than three times a week    Frequency of Social Gatherings with Friends and Family: More than three times a week    Attends Religious Services: 1 to 4 times per year    Active Member of [employer] or Organizations: Yes    Attends Banker Meetings: Never    Marital Status: Divorced    Tobacco Counseling Counseling given: Yes Tobacco comments: no plans to start    Clinical Intake:  Pre-visit preparation completed: Yes  Pain : No/denies pain Pain Score: 0-No pain     BMI - recorded: 33.13 Nutritional Status: BMI > 30  Obese Nutritional Risks: None Diabetes: No  How often do you need to  have someone help you when you read instructions, pamphlets, or other written materials from your doctor or pharmacy?: 1 -  Never What is the last grade level you completed in school?: graduate  Interpreter Needed?: No  Information entered by :: Zane Herald, CMA   Activities of Daily Living     03/12/2024    9:14 AM  In your present state of health, do you have any difficulty performing the following activities:  Hearing? 0  Vision? 0  Difficulty concentrating or making decisions? 0  Walking or climbing stairs? 0  Dressing or bathing? 0  Doing errands, shopping? 1  Comment brother help w/transportation  Preparing Food and eating ? N  Using the Toilet? Y  In the past six months, have you accidently leaked urine? N  Do you have problems with loss of bowel control? N  Managing your Medications? Y  Managing your Finances? Y  Housekeeping or managing your Housekeeping? Y    Patient Care Team: McDiarmid, Leighton Roach, MD as PCP - General (Family Medicine) Glenford Peers, OD as Referring Physician (Optometry) Levert Feinstein, MD as Consulting Physician (Neurology) Allie Bossier, MD as Consulting Physician (Obstetrics and Gynecology) Glean Salvo, NP as Nurse Practitioner (Neurology)  Indicate any recent Medical Services you may have received from other than [facility] providers in the past year (date may be approximate).     Assessment:   This is a routine wellness examination for [facility].  Hearing/Vision screen Hearing Screening - Comments:: Denied hearing dif.  Vision Screening - Comments:: Update vision-8/24 w/Dr. Dimas [facility]   Goals Addressed             This Visit's Progress    Activity and Exercise Increased   On track    Evidence-based guidance:  Review current exercise levels.  Assess patient perspective on exercise or activity level, barriers to increasing activity, motivation and readiness for change.  Recommend or set healthy exercise goal based on  individual tolerance.  Encourage small steps toward making change in amount of exercise or activity.  Urge reduction of sedentary activities or screen time.  Promote group activities within the community or with family or support person.  Consider referral to rehabiliation therapist for assessment and exercise/activity plan.          Depression Screen     03/12/2024    9:26 AM 02/05/2024   10:07 AM 05/01/2023    9:58 AM 02/07/2023    9:07 AM 05/30/2022    8:42 AM 05/24/2021   10:10 AM 02/10/2020    9:20 AM  PHQ 2/9 Scores  PHQ - 2 Score 0 0 0 0 0 0 0  PHQ- 9 Score 0 0 0  0 0     Fall Risk     03/12/2024    9:36 AM 02/05/2024   10:07 AM 02/07/2023    9:08 AM 02/10/2020    9:19 AM 03/04/2019   10:09 AM  Fall Risk   Falls in the past year? 1 0 0 0 0  Number falls in past yr: 1 0 0    Injury with Fall? 0 0 0    Risk for fall due to : Impaired balance/gait;Impaired mobility  No Fall Risks    Risk for fall due to: Comment uses walker      Follow up Falls evaluation completed;Education provided  Falls evaluation completed      MEDICARE RISK AT HOME:  Medicare Risk at Home Any stairs in or around the home?: Yes If so, are there any without handrails?: Yes Home free of loose throw rugs in walkways, pet beds, electrical cords,  etc?: No Adequate lighting in your home to reduce risk of falls?: Yes Life alert?: No (lively Home for emgerncy) Use of a cane, walker or w/c?: Yes (walker) Grab bars in the bathroom?: Yes Shower chair or bench in shower?: Yes Elevated toilet seat or a handicapped toilet?: Yes  TIMED UP AND GO:  Was the test performed?  No  Cognitive Function: 6CIT completed    10/08/2018   10:24 AM  MMSE - Mini Mental State Exam  Orientation to time 5  Orientation to Place 5  Registration 3  Attention/ Calculation 5  Recall 3  Language- name 2 objects 2  Language- repeat 1  Language- follow 3 step command 3  Language- read & follow direction 1  Write a sentence 1   Copy design 1  Total score 30        03/12/2024    9:38 AM 02/07/2023    9:09 AM 10/08/2018   10:25 AM  6CIT Screen  What Year? 0 points 0 points 0 points  What month? 0 points 0 points 0 points  What time? 0 points 0 points 0 points  Count back from 20 0 points 0 points 0 points  Months in reverse 2 points 0 points 0 points  Repeat phrase 0 points 0 points 0 points  Total Score 2 points 0 points 0 points    Immunizations Immunization History  Administered Date(s) Administered   Fluad Trivalent(High Dose 65+) 02/05/2024   Influenza,inj,Quad PF,6+ Mos 11/07/2014, 08/29/2016, 02/05/2018, 10/08/2018, 02/10/2020, 10/24/2022   PFIZER Comirnaty(Gray Top)Covid-19 Tri-Sucrose Vaccine 03/14/2020, 04/11/2020, 05/24/2021   Tdap 05/08/2016    Screening Tests Health Maintenance  Topic Date Due   Zoster Vaccines- Shingrix (1 of 2) Never done   COVID-19 Vaccine (4 - 2024-25 season) 08/31/2023   Cervical Cancer Screening (HPV/Pap Cotest)  10/17/2023   Pneumonia Vaccine 85+ Years old (1 of 1 - PCV) Never done   DEXA SCAN  01/22/2024   MAMMOGRAM  05/28/2024   Medicare Annual Wellness (AWV)  03/12/2025   DTaP/Tdap/Td (2 - Td or Tdap) 05/08/2026   Colonoscopy  10/26/2030   INFLUENZA VACCINE  Completed   Hepatitis C Screening  Completed   HIV Screening  Completed   HPV VACCINES  Aged Out    Health Maintenance  Health Maintenance Due  Topic Date Due   Zoster Vaccines- Shingrix (1 of 2) Never done   COVID-19 Vaccine (4 - 2024-25 season) 08/31/2023   Cervical Cancer Screening (HPV/Pap Cotest)  10/17/2023   Pneumonia Vaccine 87+ Years old (1 of 1 - PCV) Never done   DEXA SCAN  01/22/2024   Health Maintenance Items Addressed: See Nurse Notes  Additional Screening:  Vision Screening: Recommended annual ophthalmology exams for early detection of glaucoma and other disorders of the eye.  Dental Screening: Recommended annual dental exams for proper oral hygiene  Community Resource  Referral / Chronic Care Management: CRR required this visit?  Yes   CCM required this visit?  No     Plan:     I have personally reviewed and noted the following in the patient's chart:   Medical and social history Use of alcohol, tobacco or illicit drugs  Current medications and supplements including opioid prescriptions. Patient is not currently taking opioid prescriptions. Functional ability and status Nutritional status Physical activity Advanced directives List of other physicians Hospitalizations, surgeries, and ER visits in previous 12 months Vitals Screenings to include cognitive, depression, and falls Referrals and appointments  In addition, I have  reviewed and discussed with patient certain preventive protocols, quality metrics, and best practice recommendations. A written personalized care plan for preventive services as well as general preventive health recommendations were provided to patient.     Arta Silence, CMA   03/12/2024   After Visit Summary: (Mail) Due to this being a telephonic visit, the after visit summary with patients personalized plan was offered to patient via mail   Notes: Please refer to Routing Comments.

## 2024-03-12 NOTE — Patient Instructions (Signed)
 [name] , Thank you for taking time to come for your Medicare Wellness Visit. I appreciate your ongoing commitment to your health goals. Please review the following plan we discussed and let me know if I can assist you in the future.   Referrals/Orders/Follow-Ups/Clinician Recommendations: Please discuss with your provider about which vaccines you need to get. Plus make your appointment for your mammogram for this year.   This is a list of the screening recommended for you and due dates:  Health Maintenance  Topic Date Due   Zoster (Shingles) Vaccine (1 of 2) Never done   COVID-19 Vaccine (4 - 2024-25 season) 08/31/2023   Pap with HPV screening  10/17/2023   Pneumonia Vaccine (1 of 1 - PCV) Never done   DEXA scan (bone density measurement)  01/22/2024   Mammogram  05/28/2024   Medicare Annual Wellness Visit  03/12/2025   DTaP/Tdap/Td vaccine (2 - Td or Tdap) 05/08/2026   Colon Cancer Screening  10/26/2030   Flu Shot  Completed   Hepatitis C Screening  Completed   HIV Screening  Completed   HPV Vaccine  Aged Out    Advanced directives: (Declined) Advance directive discussed with you today. Even though you declined this today, please call our office should you change your mind, and we can give you the proper paperwork for you to fill out.  Next Medicare Annual Wellness Visit scheduled for next year: Yes

## 2024-03-15 ENCOUNTER — Telehealth: Payer: Self-pay

## 2024-03-15 DIAGNOSIS — Z79899 Other long term (current) drug therapy: Secondary | ICD-10-CM

## 2024-03-15 DIAGNOSIS — E210 Primary hyperparathyroidism: Secondary | ICD-10-CM

## 2024-03-15 DIAGNOSIS — I10 Essential (primary) hypertension: Secondary | ICD-10-CM

## 2024-03-15 DIAGNOSIS — E038 Other specified hypothyroidism: Secondary | ICD-10-CM

## 2024-03-15 DIAGNOSIS — M25569 Pain in unspecified knee: Secondary | ICD-10-CM

## 2024-03-15 NOTE — Telephone Encounter (Signed)
 Patient calls nurse line regarding results from visit with Dr. Perley Jain on 02/05/24. Patient reports that she has not received letter with results and wanted to make sure that everything was normal.   Will forward to PCP.   Veronda Prude, RN

## 2024-03-18 ENCOUNTER — Encounter: Payer: Self-pay | Admitting: Family Medicine

## 2024-03-18 MED ORDER — LEVOTHYROXINE SODIUM 100 MCG PO TABS
100.0000 ug | ORAL_TABLET | Freq: Every day | ORAL | 0 refills | Status: DC
Start: 2024-03-18 — End: 2024-05-31

## 2024-03-18 MED ORDER — CELECOXIB 200 MG PO CAPS: 200.0000 mg | ORAL_CAPSULE | Freq: Two times a day (BID) | ORAL | 3 refills | Status: AC

## 2024-03-18 MED ORDER — SPIRONOLACTONE 25 MG PO TABS: 25.0000 mg | ORAL_TABLET | Freq: Every day | ORAL | 3 refills | Status: DC

## 2024-03-18 NOTE — Telephone Encounter (Signed)
 I spoke with Mariah Jackson about her recent parathyrid panel results.  Her iPTH is in upper limit of normal (was above ULN 2021) with serum calcium 0.7 mg/dl above ULN.  Phosphate is kust below the LLN.  Chloride/Phosphate estimated using prior chloride measure is 35 (above 33 c/w PHPT)  Recommended trial off hydrochlorothiazide, part of her prescribed Aldactazide. Continue spironolactone 25 mg at bedtime, new Prescription sent to mail order pharmacy.   Requested Mariah Jackson to return in 6 weeks, approx, for iPTH, Ca, P, Comprehensive Metabolic Panel. Also requesting TSH at that time as monitor of her hypothyroid levothyroxine therapy.

## 2024-03-25 ENCOUNTER — Other Ambulatory Visit: Payer: Self-pay | Admitting: Family Medicine

## 2024-03-25 DIAGNOSIS — E038 Other specified hypothyroidism: Secondary | ICD-10-CM

## 2024-04-26 ENCOUNTER — Other Ambulatory Visit: Payer: Self-pay | Admitting: Neurology

## 2024-04-26 DIAGNOSIS — G35 Multiple sclerosis: Secondary | ICD-10-CM

## 2024-04-27 NOTE — Telephone Encounter (Signed)
 Mariah Jackson,  This was dc'd by an rn but last note from you stated to continue both. Because the person who dc'd is above my skill level please review and advise

## 2024-05-30 ENCOUNTER — Other Ambulatory Visit: Payer: Self-pay | Admitting: Family Medicine

## 2024-05-30 DIAGNOSIS — E038 Other specified hypothyroidism: Secondary | ICD-10-CM

## 2024-06-02 ENCOUNTER — Ambulatory Visit: Payer: Medicare HMO | Admitting: Neurology

## 2024-06-03 ENCOUNTER — Encounter: Payer: Self-pay | Admitting: Neurology

## 2024-06-03 ENCOUNTER — Ambulatory Visit (INDEPENDENT_AMBULATORY_CARE_PROVIDER_SITE_OTHER): Admitting: Neurology

## 2024-06-03 VITALS — BP 154/81 | HR 94 | Resp 14 | Ht 64.0 in | Wt 202.5 lb

## 2024-06-03 DIAGNOSIS — G35 Multiple sclerosis: Secondary | ICD-10-CM

## 2024-06-03 MED ORDER — TIZANIDINE HCL 4 MG PO TABS: ORAL_TABLET | ORAL | 5 refills | Status: AC

## 2024-06-03 MED ORDER — DIMETHYL FUMARATE 240 MG PO CPDR
240.0000 mg | DELAYED_RELEASE_CAPSULE | Freq: Two times a day (BID) | ORAL | 3 refills | Status: AC
Start: 2024-06-03 — End: ?

## 2024-06-03 NOTE — Progress Notes (Signed)
 [facility]  PATIENT: Mariah Jackson DOB: [birth year]  HISTORY OF PRESENT ILLNESS: HISTORY: [name], 63 year old female returns for followup for RRMS  She was diagnosed with multiple sclerosis in 2001, by Dr. Susana Enter at [facility]. The disease has primarily affected her balance and gait. She could only clarify one exacerbation, presenting as worsening balance difficulty, and says she has never been treated with intravenous steroids.   She was placed on Betaseron  shortly after diagnosis, and has been on that drug ever since.  Her gait difficulty had little changes over the  years. She ambulates with a cane, but is able to get around the house pretty well without it.   Most recent MRI was in January 2011,shows multiple periventricular, periatrial and subcortical white matter hyperintensities suggestive of demyelinating disease.No enhancing lesions or significant atrophy is seen. compared to MRI films from Aug 2000 there appear to be more T2 lesions in right frontal subcortical region.   She is tired of taking the shots and does have multiple areas of lipoatrophy, especially on the abdominal area. She is ambulating with a cane. She  switched to Tecfidera  since Oct 2014, doing well.  UPDATE Sep 18th 2015: She denies signficant side effect from Tecfidera .  She has been on Ampyra  10mg  bid for few years, which definitely has helped her walking  MRI brain March 2014: Multiple periventricular and subcortical chronic demyelinating disease. No acute plaques are seen. MRI cervical spine: Disc bulging at C5-6. Spondylosis at C5-6 and C6-7. C5-6 there is focal disc protrusion slightly deforms anterior spinal cord, with spinal stenosis or foraminal stenosis.  No intrinsic or enhancing spinal cord lesions.  UPDATE March 17th 2016: She fell in Feb 2016, now with right shoulder and neck pain, overall has improved, she is continue taking Tecfidera , Ampyra , denies significant side effect,  ambulate with spastic gait, wear her right ankle brace.  UPDATE July 16 2017: She is overall doing well, no flareup, continue have gait abnormality, complains of bilateral knee pain, has valgrus, reviewed laboratory evaluations, normal CMP, elevated alkaline phosphate 151, mild elevated glucose 101, CBC showed low MCV 75, normal TSH  Update March 16, 2018:  She is overall doing very well, no significant change, continue taking Ampyra , using of one-point cane, unsteady gait,  I have personally reviewed MRI of brain with and without contrast in August 2018, multiple T2/FLAIR hyperintensity lesions at both hemisphere, no contrast enhancement, there was no infratentorial lesions, no significant change compared to previous scans.  Telephone visit on March 23, 2019 Last office visit with us  was in March 2019, she is overall doing well, there is no need neurological symptoms since last visit, no flareup of her MS  She is continue taking Tecfidera , Ampyra  twice a day tolerating the medication well  She had history of left breast cancer, recently find right breast nodule, biopsy results on March 12, 2019 showed atypical ductal cell hyperplasia  She complains some worsening gait abnormality, contributed to her knee pain, improved with right knee injection.  I personally reviewed most recent MRI of brain with without contrast in August 2018, multiple T2/flair hyperintensity foci in the white matter of both hemispheres, with large confluences in the periatrial white matter bilaterally,  Laboratory evaluations in 2020 showed normal TSH, elevated calcium  11, normal creatinine 0.94, normal PTH 63  UPDATE March 14 2020: Her brother drove her to clinic today, she is doing well, ambulate with a cane or walker for longer distance, denies bowel and bladder incontinence, complains of  occasional bilateral calf muscle tightness, especially during morning times, she tolerating Tecfidera  twice a day well, also  taking 10 mg twice a day  She denies significant worsening of functional status, actually works better  Update May 31, 2021 SS: Feels MS is stable, got a rollator, chronic right ankle issues, flared up today. No recent falls. She actually thinks she is better, is walking more, can use her cane, goes out and shops. Brother lives with her drives with her. 2 little dogs, Bella, Honey.   MRI brain ordered last visit, not completed, she got scared, doesn't feel necessary. Remains on Tecfidera , Ampyra .  Reviewed recent labs  05/24/21, CMP showed calcium  11.1, normal liver function.  A1c 5.8, TSH 3.090, CBC WBC 4.3, no differential was done. No longer taking gabapentin . Does home exercises arm, leg presses/squeezes, walks on her ramp. Celebrex  helps with stiffness. Please how well she is doing.   Update 06/06/22 SS: Doing wonderful, still on Tecfidera , using walker/cane, no fall, feels right knee is weak, chronic right ankle issues, gaining weight, going back on weight watchers, Ampyra  helps with her walking stability. Doesn't want an MRI, is afraid. No problems with vision, numbness, b/b. Brakes on walker don't work, duct tape on sides. Lives with her brother, she doesn't drive.  Labs 05/30/22 Vitamin D  32.2, glucose 129, calcium  10.7, TSH 2.170, A1c 5.7.  Update June 03, 2023 SS: Remains on generic tecfidera , gets for free. Did PT wearing ankle brace, supposed to wear knee brace but it hurts. Using walker, cane. No falls. Vision is fine, left eye sleep is dry. Wakes up at night. No new numbness or weakness. Takes Tizanidine  as needed for muscle aches, likes the Celebrex  better. Doesn't take gabapentin ..Brother lives with her, she doesn't drive. She got a new walker. Doesn't want an MRI/   Update June 03, 2024 SS: Last visit CBC showed WBC 3.2, recheck 3.9. Remains on dimethyl fumerate. MS is stable. Vision is fine. Did PT, fitted for knee brace to prevent hyper-extension, right foot turns out. No falls. Uses  walker. Right is a little weaker chronically. Does home exercise. Brother and her son live with her. Rarely needs tizanidine  for muscle spasms. Feels about the same, doesn't want MRI, scared of it. Her calcium  was running high switched from hydrochlorothiazide to spironolactone .   REVIEW OF SYSTEMS:   See HPI  ALLERGIES: Allergies  Allergen Reactions   Hydrocodone-Acetaminophen  Nausea Only   Vicodin [Hydrocodone-Acetaminophen ] Nausea Only    HOME MEDICATIONS: Outpatient Medications Prior to Visit  Medication Sig Dispense Refill   amLODipine  (NORVASC ) 10 MG tablet TAKE 1 TABLET EVERY DAY 90 tablet 3   celecoxib  (CELEBREX ) 200 MG capsule Take 1 capsule (200 mg total) by mouth 2 (two) times daily. 180 capsule 3   cholecalciferol (VITAMIN D3) 25 MCG (1000 UNIT) tablet Take 1,000 Units by mouth daily.     Cyanocobalamin (VITAMIN B-12 PO) Take 1 tablet by mouth daily at 6 (six) AM.     dalfampridine  10 MG TB12 TAKE 1 TABLET BY MOUTH EVERY 12 HOURS 60 tablet 11   Dimethyl Fumarate  240 MG CPDR TAKE 1 CAPSULE BY MOUTH IN THE MORNING AND AT BEDTIME 60 capsule 6   Inulin (FIBER CHOICE PO) Take 1 capsule by mouth daily at 6 (six) AM.     levothyroxine  (SYNTHROID ) 100 MCG tablet TAKE 1 TABLET (100 MCG TOTAL) BY MOUTH DAILY. (NEED MD APPOINTMENT) 90 tablet 3   Multiple Vitamin (MULTIVITAMIN) tablet Take 1 tablet by mouth daily.  omeprazole  (PRILOSEC) 20 MG capsule TAKE 1 CAPSULE EVERY DAY 90 capsule 3   rosuvastatin  (CRESTOR ) 10 MG tablet TAKE 1 TABLET EVERY DAY 90 tablet 3   spironolactone  (ALDACTONE ) 25 MG tablet Take 1 tablet (25 mg total) by mouth at bedtime. 90 tablet 3   TECFIDERA  240 MG CPDR TAKE ONE CAPSULE BY MOUTH TWICE DAILY. STORE IN ORIGINAL CONTAINER AT ROOM TEMPERATURE. 180 capsule 3   tiZANidine  (ZANAFLEX ) 4 MG tablet TAKE 1 TABLET EVERY 8 HOURS AS NEEDED FOR MUSCLE SPASM(S) 90 tablet 11   valsartan  (DIOVAN ) 80 MG tablet TAKE 1 TABLET AT BEDTIME 90 tablet 3   zinc gluconate 50 MG  tablet Take 50 mg by mouth daily. Take it every other day     No facility-administered medications prior to visit.    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 concordant by Dr. Serena Chacko, with excision recommended.  Surgical consultation has been arranged with Dr. Sim Dryer at [facility] on March 19, 2019.  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  - SEE ONCOLOGY TABLE.   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 Pathology report non-cancerous, fibrous tissue.     History of ductal carcinoma in situ (DCIS) of breast 03/05/2019   Excision Biopsy 2017: DUCTAL CARCINOMA IN SITU, INTERMEDIATE GRADE WITH FOCAL  NECROSIS, 2 CM. Estrogen receptor positive  Progesterone receptor positive  8. TNM: [id number]   History of ductal carcinoma in situ (DCIS) of breast 03/05/2019   Excision Biopsy 2017: DUCTAL CARCINOMA IN SITU, INTERMEDIATE GRADE WITH FOCAL  NECROSIS, 2 CM. Estrogen receptor positive  Progesterone receptor positive  8. TNM: [id number]   Hypercalcemia, persistent    Hyperextension injury of knee, right,  initial encounter 10/24/2022   Hyperlipidemia    Hypertension    on meds   Hypothyroidism    Multinodular goiter (nontoxic) 08/29/2016   on meds   Multiple sclerosis (HCC)    Multiple sclerosis, relapsing-remitting (HCC) 09/10/2013   Personal history of radiation therapy    Pure hypercholesterolemia 11/09/2014   on meds   Sickle cell trait (HCC)    Thyroid  enlargement 11/09/2014   Right sided thyroid  enlargement      PAST SURGICAL HISTORY: Past Surgical History:  Procedure Laterality Date   BREAST BIOPSY Right 03/12/2019   atypical ductal hyperplasia complex sclerosing lesion   BREAST LUMPECTOMY  2007   left, radiation   COLONOSCOPY  2014   RK-MAC-suprep(exc)-TA x1 recall 5 years   MM BREAST STEREO BX*L*R/S Right 03/12/2019   RIGHT BREAST STEREOTACTIC CORE NEEDLE BIOPSY   TUBAL LIGATION  1985  FAMILY HISTORY: Family History  Problem Relation Age of Onset   Hypertension Mother    Pneumonia Mother    Stroke Father    Breast cancer Sister 34   Hypertension Sister    Hypertension Sister    Hypertension Brother    Diabetes Brother 52       Prediabetic   Hypertension Brother    Hypertension Brother    Hypertension Brother    Hypertension Brother    Colon cancer Neg Hx    Colitis Neg Hx    Colon polyps Neg Hx    Esophageal cancer Neg Hx    Stomach cancer Neg Hx    Rectal cancer Neg Hx     SOCIAL HISTORY: Social History   Socioeconomic History   Marital status: Widowed    Spouse name: Not on file   Number of children: 2   Years of education: 75   Highest education level: 12th grade  Occupational History   Occupation: Unemployed    Occupation: Disabiliy  Tobacco Use   Smoking status: Never   Smokeless tobacco: Never   Tobacco comments:    no plans to start  Vaping Use   Vaping status: Never Used  Substance and Sexual Activity   Alcohol use: No   Drug use: No   Sexual activity: Not Currently  Other Topics Concern   Not on file  Social History  Narrative   Patient a high school education.    Patient is unemployed.   Patient has 2 children.  Patient lives in her home with her brother, her son and DIL. One story home, ramp at back, 3-4 steps in front. Has grab bar at tub but not toilet. Pets: 2 dogs, Miss Honey and Miss Spain.   Smoke alarms in home. Carpet in home, bath mats in bathroom.  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    Patient is divorced.    Eats small meals during day. Meat, veggies, fruit. Has trouble staying asleep. Wakes up about 4 am after going to bed at 1030 pm.   Mobility:  Cane or walker   Transportation: [employer] or family member   Caregiver: daughter    Emergency Contact: daughter, Matha Solo, CNA   Advance Directive:  None   Resources:   -Financial payment plan with the neurology office.   -MS Society   -Research program participate for MS which covers some medical expense for patient.         DME: cane, walker, BP monitor (digital), partial plate (upper), eyeglasses, ankle brace   Social Drivers of Health   Financial Resource Strain: Low Risk  (03/12/2024)   Overall Financial Resource Strain (CARDIA)    Difficulty of Paying Living Expenses: Not hard at all  Food Insecurity: No Food Insecurity (03/12/2024)   Hunger Vital Sign    Worried About Running Out of Food in the Last Year: Never true    Ran Out of Food in the Last Year: Never true  Transportation Needs: No Transportation Needs (03/12/2024)   PRAPARE - Administrator, Civil Service (Medical): No    Lack of Transportation (Non-Medical): No  Physical Activity: Insufficiently Active (03/12/2024)   Exercise Vital Sign    Days of Exercise per Week: 7 days    Minutes of Exercise per Session: 20 min  Stress: No Stress Concern Present (03/12/2024)   [employer] of Occupational Health - Occupational Stress Questionnaire  Feeling of Stress : Not at all  Social Connections:  Moderately Integrated (03/12/2024)   Social Connection and Isolation Panel [NHANES]    Frequency of Communication with Friends and Family: More than three times a week    Frequency of Social Gatherings with Friends and Family: More than three times a week    Attends Religious Services: 1 to 4 times per year    Active Member of [employer] or Organizations: Yes    Attends Banker Meetings: Never    Marital Status: Divorced  Catering manager Violence: Not At Risk (03/12/2024)   Humiliation, Afraid, Rape, and Kick questionnaire    Fear of Current or Ex-Partner: No    Emotionally Abused: No    Physically Abused: No    Sexually Abused: No   PHYSICAL EXAM  Vitals:   06/03/24 1510 06/03/24 1515  BP: (!) 154/82 (!) 154/81  Pulse: 94   Resp: 14   SpO2: 94%   Weight: 202 lb 8 oz (91.9 kg)   Height: 5\' 4"  (1.626 m)     Body mass index is 34.76 kg/m.  Physical Exam  General: The patient is alert and cooperative at the time of the examination. Very pleasant.  Skin: No significant peripheral edema is noted.  Neurologic Exam  Mental status: The patient is alert and oriented x 3 at the time of the examination. The patient has apparent normal recent and remote memory, with an apparently normal attention span and concentration ability.  Cranial nerves: Facial symmetry is present. Speech is normal, no aphasia or dysarthria is noted. Extraocular movements are full. Visual fields are full.  Motor: The patient has good strength in all 4 extremities.  Mild-moderate weakness right hip flexion.  Wearing right knee brace.  Sensory examination: Soft touch sensation is symmetric on the face, arms, and legs.  Coordination: The patient has good finger-nose-finger bilaterally  Gait and station: Relies on a walker to stand,  flat footed gait, cautious, stiff, right foot external rotation   Reflexes: Deep tendon reflexes are symmetric, but slightly increased at the knees  DIAGNOSTIC DATA  (LABS, IMAGING, TESTING) - I reviewed patient records, labs, notes, testing and imaging myself where available.      Component Value Date/Time   NA 141 06/03/2023 1114   K 3.9 06/03/2023 1114   CL 104 06/03/2023 1114   CO2 25 06/03/2023 1114   GLUCOSE 120 (H) 06/03/2023 1114   GLUCOSE 91 08/29/2016 1227   BUN 12 06/03/2023 1114   CREATININE 1.03 (H) 02/05/2024 1051   CREATININE 0.82 08/29/2016 1227   CALCIUM  11.0 (H) 02/05/2024 1051   PROT 7.6 06/03/2023 1114   ALBUMIN 4.6 06/03/2023 1114   AST 19 06/03/2023 1114   ALT 21 06/03/2023 1114   ALKPHOS 135 (H) 06/03/2023 1114   BILITOT 0.5 06/03/2023 1114   GFRNONAA 75 04/05/2020 1010   GFRNONAA 80 08/29/2016 1227   GFRAA 87 04/05/2020 1010   GFRAA >89 08/29/2016 1227    ASSESSMENT AND PLAN  1.  Relapsing remitting multiple sclerosis 2.  Gait abnormality  -Doing well, MS remains overall stable - Continue dimethyl fumarate  for MS, dalfampridine  for gait -Takes tizanidine  as needed for muscle spasms, no longer takes gabapentin  -Check CBC, CMP today -Is not interested in MRI imaging, Last MRI of the brain with and without contrast in August 2018 showed evidence of supratentorium MS lesions, no contrast-enhancement, no change compared to prior MRIs -Encouraged to continue exercise, activity -Follow-up in 1 year or sooner  if needed with Dr. Gracie Lav, discuss long term plan for MS medication therapy   Meds ordered this encounter  Medications   tiZANidine  (ZANAFLEX ) 4 MG tablet    Sig: TAKE 1 TABLET EVERY 8 HOURS AS NEEDED FOR MUSCLE SPASM(S)    Dispense:  30 tablet    Refill:  5   Dimethyl Fumarate  240 MG CPDR    Sig: Take 1 capsule (240 mg total) by mouth in the morning and at bedtime.    Dispense:  180 capsule    Refill:  3   Orders Placed This Encounter  Procedures   CBC with Differential/Platelet   CMP   I personally spent a total of 40 minutes in the care of the patient today including preparing to see the patient,  getting/reviewing separately obtained history, performing a medically appropriate exam/evaluation, counseling and educating, placing orders, documenting clinical information in the EHR, communicating results, and coordinating care.  Cortland Ding, DNP  [facility] [address] [place], [place] [address] [phone]

## 2024-06-03 NOTE — Patient Instructions (Signed)
 Great to see you today.  Will continue current medications.  Check routine labs today.  Recommend continue exercise, activity.  Call for any worsening symptoms.  Follow-up in 1 year.  Thanks!!

## 2024-06-04 LAB — COMPREHENSIVE METABOLIC PANEL WITH GFR
ALT: 18 IU/L (ref 0–32)
AST: 19 IU/L (ref 0–40)
Albumin: 4.4 g/dL (ref 3.9–4.9)
Alkaline Phosphatase: 146 IU/L — ABNORMAL HIGH (ref 44–121)
BUN/Creatinine Ratio: 15 (ref 12–28)
BUN: 13 mg/dL (ref 8–27)
Bilirubin Total: 0.4 mg/dL (ref 0.0–1.2)
CO2: 21 mmol/L (ref 20–29)
Calcium: 10.3 mg/dL (ref 8.7–10.3)
Chloride: 109 mmol/L — ABNORMAL HIGH (ref 96–106)
Creatinine, Ser: 0.85 mg/dL (ref 0.57–1.00)
Globulin, Total: 2.8 g/dL (ref 1.5–4.5)
Glucose: 140 mg/dL — ABNORMAL HIGH (ref 70–99)
Potassium: 3.9 mmol/L (ref 3.5–5.2)
Sodium: 143 mmol/L (ref 134–144)
Total Protein: 7.2 g/dL (ref 6.0–8.5)
eGFR: 76 mL/min/{1.73_m2} (ref >59)

## 2024-06-04 LAB — CBC WITH DIFFERENTIAL/PLATELET
Basophils Absolute: 0 10*3/uL (ref 0.0–0.2)
Basos: 0 %
EOS (ABSOLUTE): 0 10*3/uL (ref 0.0–0.4)
Eos: 0 %
Hematocrit: 46.8 % — ABNORMAL HIGH (ref 34.0–46.6)
Hemoglobin: 14.6 g/dL (ref 11.1–15.9)
Immature Grans (Abs): 0 10*3/uL (ref 0.0–0.1)
Immature Granulocytes: 0 %
Lymphocytes Absolute: 0.9 10*3/uL (ref 0.7–3.1)
Lymphs: 22 %
MCH: 25.4 pg — ABNORMAL LOW (ref 26.6–33.0)
MCHC: 31.2 g/dL — ABNORMAL LOW (ref 31.5–35.7)
MCV: 82 fL (ref 79–97)
Monocytes Absolute: 0.3 10*3/uL (ref 0.1–0.9)
Monocytes: 8 %
Neutrophils Absolute: 2.7 10*3/uL (ref 1.4–7.0)
Neutrophils: 70 %
Platelets: 254 10*3/uL (ref 150–450)
RBC: 5.74 x10E6/uL — ABNORMAL HIGH (ref 3.77–5.28)
RDW: 14.4 % (ref 11.7–15.4)
WBC: 3.9 10*3/uL (ref 3.4–10.8)

## 2024-06-06 ENCOUNTER — Ambulatory Visit: Payer: Self-pay | Admitting: Neurology

## 2024-07-05 ENCOUNTER — Other Ambulatory Visit: Payer: Self-pay | Admitting: Family Medicine

## 2024-07-05 DIAGNOSIS — Z0000 Encounter for general adult medical examination without abnormal findings: Secondary | ICD-10-CM

## 2024-07-14 ENCOUNTER — Ambulatory Visit

## 2024-07-26 ENCOUNTER — Telehealth: Payer: Self-pay | Admitting: Neurology

## 2024-07-26 DIAGNOSIS — G35 Multiple sclerosis: Secondary | ICD-10-CM

## 2024-07-26 DIAGNOSIS — R269 Unspecified abnormalities of gait and mobility: Secondary | ICD-10-CM

## 2024-07-26 NOTE — Telephone Encounter (Signed)
 Pt asking if Lauraine, NP can place a request for a wheelchair for her

## 2024-07-26 NOTE — Telephone Encounter (Signed)
 Dr. Onita,  would you be willing to order wheel chair. I will call Patient and request specifics if she request electric or manual once I know if this is something you will accommodate. Sarah slack np is out today so I would ask the attending MD  Thanks,  Nevelyn

## 2024-07-28 NOTE — Addendum Note (Signed)
 Addended by: Stepehn Eckard on: 07/28/2024 04:43 PM   Modules accepted: Orders

## 2024-07-28 NOTE — Telephone Encounter (Signed)
Orders Placed This Encounter  Procedures  . Ambulatory referral to Physical Therapy      

## 2024-07-29 NOTE — Telephone Encounter (Signed)
 Wheelchair order sent to [facility] via community msg thru epic:

## 2024-08-02 NOTE — Telephone Encounter (Signed)
 Patient said no one has called to about getting a wheelchair. Would like a call back.

## 2024-08-02 NOTE — Telephone Encounter (Signed)
 Called and stated: Dr.Yan placed referral for PT consult to make recommendations about wheelchair specifications   Pt voiced gratitude and understanding

## 2024-08-02 NOTE — Telephone Encounter (Signed)
 There is not a referral in our workqueue that was sent for a wheelchair. I think you all sends a order.

## 2024-08-02 NOTE — Telephone Encounter (Signed)
 Lauraine can you place order for electronic wheelchair? I thought Dr. Onita did but just looks like pt order placed

## 2024-08-02 NOTE — Telephone Encounter (Signed)
 What is the status on referral for wheelchair?

## 2024-08-05 ENCOUNTER — Ambulatory Visit
Admission: RE | Admit: 2024-08-05 | Discharge: 2024-08-05 | Disposition: A | Discharge status: Home / Self Care | Source: Ambulatory Visit | Attending: Family Medicine | Admitting: Family Medicine

## 2024-08-05 DIAGNOSIS — Z0000 Encounter for general adult medical examination without abnormal findings: Secondary | ICD-10-CM

## 2024-08-06 ENCOUNTER — Other Ambulatory Visit: Payer: Self-pay | Admitting: Family Medicine

## 2024-08-06 DIAGNOSIS — N6489 Other specified disorders of breast: Secondary | ICD-10-CM

## 2024-09-07 ENCOUNTER — Ambulatory Visit: Attending: Neurology

## 2024-09-07 ENCOUNTER — Other Ambulatory Visit: Payer: Self-pay

## 2024-09-07 DIAGNOSIS — M6281 Muscle weakness (generalized): Secondary | ICD-10-CM | POA: Insufficient documentation

## 2024-09-07 DIAGNOSIS — R2689 Other abnormalities of gait and mobility: Secondary | ICD-10-CM | POA: Insufficient documentation

## 2024-09-07 DIAGNOSIS — R262 Difficulty in walking, not elsewhere classified: Secondary | ICD-10-CM | POA: Insufficient documentation

## 2024-09-07 DIAGNOSIS — G35 Multiple sclerosis: Secondary | ICD-10-CM | POA: Diagnosis not present

## 2024-09-07 DIAGNOSIS — R269 Unspecified abnormalities of gait and mobility: Secondary | ICD-10-CM | POA: Insufficient documentation

## 2024-09-07 DIAGNOSIS — R278 Other lack of coordination: Secondary | ICD-10-CM | POA: Diagnosis not present

## 2024-09-07 NOTE — Therapy (Signed)
 OUTPATIENT PHYSICAL THERAPY WHEELCHAIR EVALUATION   Patient Name: Mariah Jackson MRN: [id number] DOB:1961/04/23, 63 y.o., female Today's Date: 09/07/2024  END OF SESSION:  PT End of Session - 09/07/24 1017     Visit Number 1    Number of Visits 1    PT Start Time 1015    PT Stop Time 1100    PT Time Calculation (min) 45 min    Equipment Utilized During Treatment Gait belt    Activity Tolerance Patient tolerated treatment well    Behavior During Therapy WFL for tasks assessed/performed          Past Medical History:  Diagnosis Date   Abnormality of gait 09/10/2013   Arthritis    on meds   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   [facility] Right Breast Core Needle Biopsy 03/12/19: Pathology revealed ATYPICAL DUCTAL HYPERPLASIA ARISING IN A COMPLEX SCLEROSING LESION of the Right breast, medial, middle depth. concordant by Dr. Serena Chacko, with excision recommended.  Surgical consultation has been arranged with Dr. Debby Shipper at [facility] on March 19, 2019.  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  - SEE ONCOLOGY TABLE.   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 Pathology report non-cancerous, fibrous tissue.     History of ductal carcinoma in situ (DCIS) of breast 03/05/2019   Excision Biopsy 2017: DUCTAL CARCINOMA IN SITU, INTERMEDIATE GRADE WITH FOCAL  NECROSIS, 2 CM. Estrogen receptor  positive  Progesterone receptor positive  8. TNM: pTispNXpMX   History of ductal carcinoma in situ (DCIS) of breast 03/05/2019   Excision Biopsy 2017: DUCTAL CARCINOMA IN SITU, INTERMEDIATE GRADE WITH FOCAL  NECROSIS, 2 CM. Estrogen receptor positive  Progesterone receptor positive  8. TNM: pTispNXpMX   Hypercalcemia, persistent    Hyperextension injury of knee, right, initial encounter 10/24/2022   Hyperlipidemia    Hypertension    on meds   Hypothyroidism    Multinodular goiter (nontoxic) 08/29/2016   on meds   Multiple sclerosis (HCC)    Multiple sclerosis, relapsing-remitting (HCC) 09/10/2013   Personal history of radiation therapy    Pure hypercholesterolemia 11/09/2014   on meds   Sickle cell trait (HCC)    Thyroid  enlargement 11/09/2014   Right sided thyroid  enlargement     Past Surgical History:  Procedure Laterality Date   BREAST BIOPSY Right 03/12/2019   atypical ductal hyperplasia complex sclerosing lesion   BREAST LUMPECTOMY  2007   left, radiation   COLONOSCOPY  2014   RK-MAC-suprep(exc)-TA x1 recall 5 years   MM BREAST STEREO BX*L*R/S Right 03/12/2019   RIGHT BREAST STEREOTACTIC CORE NEEDLE BIOPSY   TUBAL LIGATION  1985   Patient Active Problem List   Diagnosis Date Noted   Impaired mobility and ADLs 02/16/2024   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 Krystal McDiarmid  REFERRING PROVIDER: Dr. Modena Callander  THERAPY DIAG:  Difficulty in walking,  not elsewhere classified  Muscle weakness (generalized)  Other abnormalities of gait and mobility  Other lack of coordination  Rationale for Evaluation and Treatment Rehabilitation  SUBJECTIVE:                                                                                                                                                                                           SUBJECTIVE STATEMENT: Pt presents for wheelchair evaluation. She was diagnosed with multiple sclerosis in 2001. Pt is currently ambulating with Rolator and has to wear R knee brace due to R knee instability and weakness. Patient is inquiring about a chair that she can use in the home and community   PRECAUTIONS: Fall  RED FLAGS: None  WEIGHT BEARING RESTRICTIONS No    OCCUPATION: on disability  PLOF:  Requires assistive device for independence  PATIENT GOALS: obtain power mobility device that is foldable         MEDICAL HISTORY:  Primary diagnosis onset: 07/28/2024- date of referral     Medical Diagnosis with ICD-10 code: G35 (ICD-10-CM) - Multiple sclerosis, relapsing-remitting (HCC) R26.9 (ICD-10-CM) - Gait abnormality   [x] Progressive disease  Relevant future surgeries:     Height: 5' 4 Weight: 185 lbs Explain recent changes or trends in weight:      History:  Past Medical History:  Diagnosis Date   Abnormality of gait 09/10/2013   Arthritis    on meds   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   [facility] Right Breast Core Needle Biopsy 03/12/19: Pathology revealed ATYPICAL DUCTAL HYPERPLASIA ARISING IN A COMPLEX SCLEROSING LESION of the Right breast, medial, middle depth. concordant by Dr. Serena Chacko, with excision recommended.  Surgical consultation has been arranged with Dr. Debby Shipper at [facility] on March 19, 2019.  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  - SEE ONCOLOGY TABLE.   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 Pathology report non-cancerous, fibrous tissue.     History of ductal carcinoma in situ (DCIS) of breast 03/05/2019   Excision Biopsy 2017: DUCTAL CARCINOMA IN SITU, INTERMEDIATE GRADE WITH FOCAL  NECROSIS, 2 CM. Estrogen receptor positive  Progesterone receptor positive  8. TNM: pTispNXpMX   History of ductal carcinoma in situ (DCIS) of breast 03/05/2019   Excision Biopsy 2017: DUCTAL CARCINOMA IN SITU, INTERMEDIATE GRADE WITH FOCAL  NECROSIS, 2 CM. Estrogen receptor positive  Progesterone receptor positive  8. TNM: pTispNXpMX   Hypercalcemia, persistent    Hyperextension injury of knee, right, initial encounter 10/24/2022   Hyperlipidemia    Hypertension    on meds   Hypothyroidism    Multinodular goiter (nontoxic) 08/29/2016   on meds   Multiple sclerosis (HCC)    Multiple sclerosis, relapsing-remitting (HCC) 09/10/2013   Personal history of radiation therapy    Pure hypercholesterolemia 11/09/2014   on meds   Sickle cell trait (HCC)    Thyroid  enlargement 11/09/2014   Right sided thyroid  enlargement         Cardio Status:  Functional Limitations:   [x] Intact  []  Impaired      Respiratory Status:  Functional Limitations:   [x] Intact  [] Impaired   [] SOB [] COPD [] O2 Dependent ______LPM  [] Ventilator Dependent  Resp equip:                                                     Objective Measure(s):   Orthotics:   [] Amputee:                                                              [] Prosthesis:        HOME ENVIRONMENT:  [x] House [] Condo/town home [] Apartment [] Asst living [] LTCF         [x] Own  [] Rent   [] Lives alone [x] Lives with others -  brother                           Hours without assistance: 4-6 hours  [x] Home is accessible to patient                                 Storage of wheelchair:  [x] In home   [] Other Comments: Pt has a ramp at home       COMMUNITY :  TRANSPORTATION:  [x] Car [] Nurse, learning disability [] Adapted w/c Lift []  Ambulance [] Other:                     [] Sits in wheelchair during transport   Where is w/c stored during transport?  [] [place] [place]  []  [employer]  r   [] Self-Driver  Drive while in  Wheelchair [] yes [] no   Employment and/or school:  Specific requirements pertaining to mobility        Other:  COMMUNICATION:  Verbal Communication  [x] WFL [] receptive [] WFL [] expressive [] Understandable  [] Difficult to understand  [] non-communicative  Primary Language:______English________ 2nd:_____________  Communication provided by:[x] Patient [] Family [] Caregiver [] Translator   [] Uses an augmentative communication device     Manufacturer/Model :                                                                MOBILITY/BALANCE:  Sitting Balance  Standing Balance  Transfers  Ambulation   [x] WFL      [] WFL  [] Independent  []  Independent   [] Uses UE for balance in sitting Comments:  [x] Uses UE/device for stability Comments: Rolator [x]  Min assist - uses Rolator [x]  Ambulates independently with       device:__Rolator but limited endurance    []  Mod assist  []  Able to ambulate ______ feet        safely/functionally/independently   []  Min assist  []  Min assist  []  Max assist  []  Non-functional ambulator         History/High risk of falls   []  Mod assist  []  Mod assist  []  Dependent  []  Unable to ambulate   []  Max  assist  []  Max assist  Transfer method:[] 1 person [] 2 person [] sliding board [] squat  pivot [x] stand pivot [] mechanical patient lift  [] other:   []  Unable  []  Unable    Fall History: # of falls in the past 6 months? 0 # of "near" falls in the past 6 months? 1-2x    CURRENT SEATING / MOBILITY:  Current Mobility Device: [] None [x] Cane/Walker [] Manual [] Dependent [] Dependent w/ Tilt rScooter  [] Power (type of control):   Manufacturer:  Model:  Serial #:   Size:  Color:  Age:   Purchased by whom:   Current condition of mobility base:    Current seating system:                                                                       Age of seating system:    Describe posture in present seating system:    Is the current mobility meeting medical necessity?:  [] Yes [x] No Describe: Pt is able to ambulate but due to flucturating symptoms due to MS, her endurance is limited with standing/walking to perform ADLs at home. She lives with her brother who has above knee amputation so she has to be able to perform her ADLs, cooking, cleaning etc.                                    Ability to complete Mobility-Related Activities of Daily Living (MRADL's) with Current Mobility Device:   Move room to room  [x] Independent  [] Min [] Mod [] Max assist  [] Unable  Comments:   Meal prep  [] Independent  [x] Min [] Mod [] Max assist  [] Unable    Feeding  [  x]Independent  [] Min [] Mod [] Max assist  [] Unable    Bathing  [x] Independent  [] Min [] Mod [] Max assist  [] Unable    Grooming  [x] Independent  [] Min [] Mod [] Max assist  [] Unable    UE dressing  [x] Independent  [] Min [] Mod [] Max assist  [] Unable    LE dressing  [x] Independent   [] Min [] Mod [] Max assist  [] Unable    Toileting  [x] Independent  [] Min [] Mod [] Max assist  [] Unable    Bowel Mgt: [x]  Continent []  Incontinent []  Accidents []  Diapers []  Colostomy []  Bowel Program:  Bladder Mgt: [x]  Continent []  Incontinent []  Accidents []  Diapers []  Urinal []  Intermittent Cath []  Indwelling Cath []  Supra-pubic Cath     Current Mobility Equipment Trialed/ Ruled  Out:    Does not meet mobility needs due to:    Mark all boxes that indicate inability to use the specific equipment listed     Meets needs for safe  independent functional  ambulation  / mobility    Risk of  Falling or History of Falls    Enviromental limitations      Cognition    Safety concerns with  physical ability    Decreased / limitations endurance  & strength     Decreased / limitations  motor skills  & coordination    Pain    Pace /  Speed    Cardiac and/or  respiratory condition    Contra - indicated by diagnosis   Cane/Crutches  []   [x]   []   []   [x]   [x]   [x]   [x]   [x]   []   []    Walker / Rollator  []  NA   []   [x]   []   []   [x]   [x]   [x]   [x]   [x]   []   []     Manual Wheelchair [id number]:  []  NA  [x]   []   []   []   []   []   []   []   []   []   []    Manual W/C (K0005) with power assist  []  NA  []   []   []   []   []   []   []   []   []   []   []    Scooter  []  NA  []   []   []   []   []   []   []   []   []   []   []    Power Wheelchair: standard joystick  []  NA  []   []   []   []   []   []   []   []   []   []   []    Power Wheelchair: alternative controls  []  NA  []   []   []   []   []   []   []   []   []   []   []    Summary:  The least costly alternative for independent functional mobility was found to be:    []  Crutch/Cane  []  Walker [x]  Manual w/c  []  Manual w/c with power assist   []  Scooter   []  Power w/c std joystick   []  Power w/c alternative control        []  Requires dependent care mobility device   Cabin crew for [employer] skills are adequate for safe mobility equipment operation  [x]   Yes []   No  Patient is willing and motivated to use recommended mobility equipment  [x]   Yes []   No       []  Patient is unable to safely operate mobility equipment independently and requires dependent care equipment Comments:           SENSATION  and SKIN ISSUES:  Sensation [x]  Intact  []  Impaired []  Absent []  Hyposensate []  Hypersensate  []  Defensiveness  Location(s) of  impairment:    Pressure Relief Method(s):  [x]  Lean side to side to offload (without risk of falling)  [x]   W/C push up (4+ times/hour for 15+ seconds) [x]  Stand up (without risk of falling)    []  Other: (Describe): Effective pressure relief method(s) above can be performed consistently throughout the day: [x] Yes  []  No If not, Why?:  Skin Integrity Risk:       [x]  Low risk           []  Moderate risk            []  High risk  If high risk, explain:   Skin Issues/Skin Integrity  Current skin Issues  []  Yes [x]  No []  Intact  []   Red area   []   Open area  []  Scar tissue  []  At risk from prolonged sitting  Where: History of Skin Issues  []  Yes [x]  No Where : When: Stage: Hx of skin flap surgeries  []  Yes [x]  No Where:  When:  Pain: [x]  Yes []  No   Pain Location(s): R ankle,  R calf Intensity scale: (0-10) : 8-9/10 How does pain interfere with mobility and/or MRADLs? - Difficulty with prolonged standing/walking        MAT EVALUATION:  Neuro-Muscular Status: (Tone, Reflexive, Responses, etc.)     []   Intact   [x]  Spasticity:  []  Hypotonicity  []  Fluctuating  [x]  Muscle Spasms  []  Poor Righting Reactions/Poor Equilibrium Reactions  []  Primal Reflex(s):    Comments:            COMMENTS:    POSTURE:     Comments:  Pelvis Anterior/Posterior:  [x]  Neutral   []  Posterior  []  Anterior  []  Fixed - No movement []  Tendency away from neutral []  Flexible []  Self-correction []  External correction Obliquity (viewed from front)  [x]  WFL []  R Obliquity []  L Obliquity  []  Fixed - No movement []  Tendency away from neutral []  Flexible []  Self-correction []  External correction Rotation  [x]  WFL []  R anterior []  L anterior  []  Fixed - No movement []  Tendency away from neutral []  Flexible []  Self-correction []  External correction Tonal Influence Pelvis:  [x]  Normal []  Flaccid []  Low tone []  Spasticity []  Dystonia []  Pelvis thrust []  Other:    Trunk  Anterior/Posterior:  [x]  WFL []  Thoracic kyphosis []  Lumbar lordosis  []  Fixed - No movement []  Tendency away from neutral []  Flexible []  Self-correction []  External correction  [x]  WFL []  Convex to left  []  Convex to right []  S-curve   []  C-curve []  Multiple curves []  Tendency away from neutral []  Flexible []  Self-correction []  External correction Rotation of shoulders and upper trunk:  [x]  Neutral []  Left-anterior []  Right- anterior []  Fixed- no movement []  Tendency away from neutral []  Flexible []  Self correction []  External correction Tonal influence Trunk:  [x]  Normal []  Flaccid []  Low tone []  Spasticity []  Dystonia []  Other:   Head & Neck  [x]  Functional []  Flexed    []  Extended []  Rotated right  []  Rotated left []  Laterally flexed right []  Laterally flexed left []  Cervical hyperextension   [x]  Good head control []  Adequate head control []  Limited head control []  Absent head control Describe tone/movement of head and neck:      Lower Extremity Measurements: LE MMT:  MMT Right 09/07/2024 Left 09/07/2024  Hip flexion 4/5 5/5  Hip extension    Hip abduction    Hip adduction    Knee flexion 4/5 5/5  Knee extension 3+/5 5/5  Ankle dorsiflexion 4/5 5/5  Ankle plantarflexion     (Blank rows = not tested)    (Blank rows = not tested)  Hip positions:  [x]  Neutral   []  Abducted   []  Adducted  []  Subluxed   []  Dislocated   []  Fixed   []  Tendency away from neutral []  Flexible []  Self-correction []  External correction   Hip Windswept:[x]  Neutral  []  Right    []  Left  []  Subluxed   []  Dislocated   []  Fixed   []  Tendency away from neutral []  Flexible []  Self-correction []  External correction  LE Tone: []  Normal []  Low tone [x]  Spasticity  R LE []  Flaccid []  Dystonia []  Rocks/Extends at hip []  Thrust into knee extension []  Pushes legs downward into footrest  Foot positioning: ROM Concerns: Dorsiflexed: []  Right   []  Left Plantar  flexed: []  Right    []  Left Inversion: []  Right    []  Left Eversion: [x]  Right    []  Left  LE Edema: []  1+ (Barely detectable impression when finger is pressed into skin) [x]  2+ (slight indentation. 15 seconds to rebound) []  3+ (deeper indentation. 30 seconds to rebound) []  4+ (>30 seconds to rebound)  UE Measurements:  UPPER EXTREMITY ROM:   Active ROM Right 09/07/2024 Left 09/07/2024  Shoulder flexion    Shoulder abduction    Shoulder adduction    Elbow flexion    Elbow extension    Wrist flexion    Wrist extension    (Blank rows = not tested)  UPPER EXTREMITY MMT:  MMT Right 09/07/2024 Left 09/07/2024  Shoulder flexion 5 5  Shoulder abduction 5 5  Shoulder adduction    Elbow flexion 5 5  Elbow extension 5 5  Wrist flexion    Wrist extension    Pinch strength    Grip strength 71 lbs 78 lbs  (Blank rows = not tested)  Shoulder Posture:  Right Tendency towards Left  [x]   Functional [x]    []   Elevation []    []   Depression []    []   Protraction []    []   Retraction []    []   Internal rotation []    []   External rotation []    []   Subluxed []     UE Tone: [x]  Normal []  Flaccid []  Low tone []  Spasticity  []  Dystonia []  Other:   UE Edema: [x]  1+ (Barely detectable impression when finger is pressed into skin) []  2+ (slight indentation. 15 seconds to rebound) []  3+ (deeper indentation. 30 seconds to rebound) []  4+ (>30 seconds to rebound)  Wrist/Hand: Handedness: [x]  Right   []  Left   []  NA: Comments:  Right  Left  [x]   WNL [x]    []   Limitations []    []   Contractures []    []   Fisting []    []   Tremors []    []   Weak grasp []    []   Poor dexterity []    []   Hand movement non functional []    []   Paralysis []         MOBILITY BASE RECOMMENDATIONS and JUSTIFICATION:  MOBILITY BASE  JUSTIFICATION   Manufacturer:   [employer] Model:      Helio A6  Color:  Seat Width:  18 Seat Depth 18   [x]  Manual mobility base (continue below)    []  Scooter/POV  []  Power mobility base   Number of hours per day spent in above selected mobility base: 6-8  Typical daily mobility base use Schedule: Intermittently throughout the day to manage fatigue while staying Ind with ADLs  []  is not a safe, functional ambulator  [x]  limitation prevents from completing a MRADL(s) within a reasonable time frame    [x]  limitation places at high risk of morbidity or mortality secondary to  the attempts to perform a    MRADL(s)  []  limitation prevents accomplishing a MRADL(s) entirely  [x]  provide independent mobility  [x]  equipment is a lifetime medical need  [x]  walker or cane inadequate  []  any type manual wheelchair      inadequate  []  scooter/POV inadequate      []  requires dependent mobility          MANUAL MOBILITY      []  Standard manual wheelchair  K0001      Arm:    []  both []  right  []  left      Foot:   []  both []  right   []  left  []  self-propels wheelchair  []  will use on regular basis  []  chair fits throughout home  []  willing and motivated to use  []  propels with assistance     []  dependent use   []  Standard hemi-manual wheelchair  K0002      Arm:    []  both []  right  []  left      Foot:   []  both []  right   []  left  []  lower seat height required to foot propel  []  short stature  []  self-propels wheelchair  []  will use on regular basis  []  chair fits throughout home  []  willing and motivated to use   []  propels with assistance  []  dependent use   []  Lightweight manual wheelchair  K0003      Arm:    []  both []  right  []  left      Foot:   []  both  []  right  []  left                   []  hemi height required  []  medical condition and weight of  wheelchair affect ability to self      propel standard manual wheelchair in the residence  []  can and does self-propel (marginal propulsion skills)  []  daily use _________hours  []  chair fits throughout home  []  willing and motivated to use  []  lower seat height required to  foot propel  []  short stature   []  High strength lightweight manual  wheelchair (Breezy Ultra 4)  K0004     Arm:    []  both []  right  []  left     Foot:   []  both []  right   []  left                                                                  []  hemi height required []  medical condition and weight of wheelchair affect ability to self propel while engaging in frequent MRADL(s) that cannot be performed in a standard  or lightweight manual wheelchair  []  daily use _________hours  []  chair fits throughout home  []  willing and motivated to use  []  prevent repetitive use injuries   []  lower seat height required to foot propel  []  short stature    []  Ultra-lightweight manual wheelchair  K0005     Arm:    [x]  both []  right  []  left     Foot:   [x]  both []  right  []  left       []  hemi height required  []  heavy duty    Front seat to floor __17.5___ inches      Rear seat to floor _16.5____ inches      Back height __16___ inches      Back angle ______ degrees      Front angle ___70__ degrees  []   full-time manual wheelchair user  [x]  Requires individualized fitting and optimal adjustments for multiple features that include adjustable axle configuration, fully adjustable center of gravity, wheel camber, seat and back angle, angle of seat slope, which cannot be accommodated by a K0001 through K0004 manual wheelchair  [x]  prevent repetitive use injuries  [x]  daily use___6-8______hours   [x]  user has high activity patterns that frequently require  them  to go out into the community for the purpose of independently accomplishing high level MRADL activities. Examples of these might include a combination of; shopping, work, school, Photographer, childcare, independently loading and unloading from a vehicle etc.  [x]  lower seat height required to foot propel  []  short stature  []  heavy duty -  weight over 250lbs   []  Current chair is a K0005   manufacture:___________________   model:_________________  serial#____________________  age:_________    []  First time X9994 user (complete trial)  K0004 time and # of strokes to propel 30 feet: ________seconds _________strokes  X9994 time and # of strokes to propel 30 feet: ________seconds _________strokes  What was the result of the trial between the K0004 and K0005 manual wheelchair? ___    What features of the K0005 w/c are needed as compared to the K0004 base? Why?___    []  adjustable seat and back angle changes the angle of seat slope of the frame to attain a gravity assisted position for efficient propulsion and proper weight distribution along the frame     []  the front of the wheelchair will be configured higher than the back of the chair to allow gravity to assist the user with postural stability  []  the center of the wheel will be positioned for stability, safety and efficient propulsion  []  adjustable axle allows for vertical, horizontal, camber and overall width changes  throughout the wheels for adjustment of the client's exact needs and abilities.   []  adjustable axle increases the stability and function of the chair allowing for adjustment of the center of gravity.   []  accommodates the client's anatomical position in the chair maximizing independence in mobility and maneuverability in all environments.   []  create a minimal fixed tilt-in space to assist in positioning.   []  Describe users full-time manual wheelchair activity patterns:___    []  Power assist Comments:  []  prevent repetitive use injuries  []  repetitive strain injury present in    shoulder girdle    []  shoulder pain is (> or =) to 7/10     during manual propulsion       Current Pain _____/10  []  requires conservation of energy to participate in MRADL(s) runable to propel up ramps or  curbs using manual wheelchair  []  been K0005 user greater than one year  []  user unwilling to use power      wheelchair (reason): []  less expensive option  to power   wheelchair   []  rim activated power assist -      decreased strength   []  Heavy duty manual wheelchair       K0006     Arm:    []  both []  right  []  left     Foot:   []  both []  right  []  left     []  hemi height required    []  Dependent base  []  user exceeds 250lbs  []  non-functional ambulator    []  extreme spasticity  []  over active movement   []  broken frame/hx of repeated     repairs  []  able to self-propel in residence       []  lower seat to floor height required  []  unable to self-propel in residence   []  Extra heavy duty manual wheelchair  K0007     Arm:    []  both []  right  []  left     Foot:   []  both []  right  []  left     []  hemi height required  []  Dependent base  []  user exceeds 300lbs  []  non-functional ambulator    []  able to self-propel in residence   []  lower seat to floor height required  []  unable to self-propel in residence     []  Manual wheelchair with tilt [phone]      (Manual "Tilt-n-Space")  []  patient is dependent for transfers  []  patient requires frequent       positioning for pressure relief   []  patient requires frequent      positioning for poor/absent trunk control        []  Stroller Base  []  infant/child   []  unable to propel manual      wheelchair  []  allows for growth  []  non-functional ambulator  []  non-functional UE  []  independent mobility is not a goal at this time    MANUAL FRAME OPTIONS      Push handles  []  extended   []  angle adjustable   [x]  standard  []  caregiver access  [x]  caregiver assist    []  allows "hooking" to enable      increased ability to perform ADLs or maintain balance   [x]  Angle Adjustable Back  [x]  postural control  []  control of tone/spasticity  []  accommodation of range of motion  [x]  UE functional control  []  accommodation for seating system    Rear wheel placement  []  std/fixed  [x] fully adjustableramputee   []  camber ____2____degree  []  removable rear wheel  []  non-removable rear wheel  Wheel  size _24______  Wheel style__spokes_____________ [x]  improved UE access to wheels  [x]  increase propulsion ability  [x]  improved stability  [x]  changing angle in space for      improvement of postural stability  [x]  remove for transport    []  allow for seating system to fit on  base  []  amputee placement  []  1-arm drive access   r R  r L  []  enable propulsion of manual       wheelchair with one arm    []  amputee placement   Wheel rims/ Hand rims  [x]  Standard    []  Specialized-____ []  provide ability to propel manual   []  increase self-propulsion with hand wheelchair weakness/decreased grasp     []   Spoke protector/guard   []  prevent hands from getting caught in spokes   Tires:  []  pneumatic  []  flat free inserts  [x]  solid  Style:  [x]  decrease roll resistance              [x]  prevent frequent flats  [x]  increase shock absorbency  [x]  decrease maintenance   [x]  decrease pain from road shock    []  decrease spasms from road shock    Wheel Locks:    [x]  push []  pull []  scissor  []  lock wheels for transfers  [x]  lock wheels from rolling   Brake/wheel lock extension:  []  R  []  L  []  allow user to operate wheel locks due to decreased reach or strength   Caster housing:  Materials engineer size:                      Style:                                          []  suspension fork  []  maneuverability   []  stability of wheelchair   []  durability  []  maintenance  []  angle adjustment for posture  []  allow for feet to come under        wheelchair base  []  allows change in seat to floor  height   []  increase shock absorbency  []  decrease pain from road shock  []  decrease spasms from road    shock   []  Side guards  []  prevent clothing getting caught in wheel or becoming soiled   [] provide hip and pelvic stability  []  eliminates contact between body and wheels  []  limit hand contact with wheels   [x]  Anti-tippers      [x]  prevent wheelchair from tipping    backward  [x]  assist caregiver with  curbs     POWER MOBILITY      []  Scooter/POV    []  can safely operate   []  can safely transfer   []  has adequate trunk stability   []  cannot functionally propel  manual wheelchair    []  Power mobility base    []  non-ambulatory   []  cannot functionally propel manual wheelchair   []  cannot functionally and safely      operate scooter/POV  []  can safely operate power       wheelchair  []  home is accessible  []  willing to use power wheelchair     Tilt  []  Powered tilt on powered chair  []  Powered tilt on manual chair  []  Manual tilt on manual chair Comments:  []  change position for pressure      []  elief/cannot weight shift   []  change position against      gravitational force on head and      shoulders   []  decrease pain  []  blood pressure management   []  control autonomic dysreflexia  []  decrease respiratory distress  []  management of spasticity  []  management of low tone  []  facilitate postural control   []  rest periods   []  control edema  []  increase sitting tolerance   []  aid with transfers     Recline   []  Power recline on power chair  []  Manual recline on manual chair  Comments:    []  intermittent catheterization  []  manage spasticity  []  accommodate femur to back angle  []  change  position for pressure relief/cannot weight shift rhigh risk of pressure sore development  []  tilt alone does not accomplish     effective pressure relief, maximum pressure relief achieved at -      _______ degrees tilt   _______ degrees recline   []  difficult to transfer to and from bed []  rest periods and sleeping in chair  []  repositioning for transfers  []  bring to full recline for ADL care  []  clothing/diaper changes in chair  []  gravity PEG tube feeding  []  head positioning  []  decrease pain  []  blood pressure management   []  control autonomic dysreflexia  []  decrease respiratory distress  []  user on ventilator     Elevator on mobility base  []  Power wheelchair  []  Scooter   []  increase Indep in transfers   []  increase Indep in ADLs    []  bathroom function and safety  []  kitchen/cooking function and safety  []  shopping  []  raise height for communication at standing level  []  raise height for eye contact which reduces cervical neck strain and pain  []  drive at raised height for safety and navigating crowds  []  Other:   []  Vertical position system  (anterior tilt)     (Drive locks-out)    []  Stand       (Drive enabled)  []  independent weight bearing  []  decrease joint contractures  []  decrease/manage spasticity  []  decrease/manage spasms  []  pressure distribution away from   scapula, sacrum, coccyx, and ischial tuberosity  []  increase digestion and elimination   []  access to counters and cabinets  []  increase reach  []  increase interaction with others at eye level, reduces neck strain  []  increase performance of       MRADL(s)      Power elevating legrest    []  Center mount (Single) 85-170 degrees       []  Standard (Pair) 100-170 degrees  []  position legs at 90 degrees, not available with std power ELR  []  center mount tucks into chair to decrease turning radius in home, not available with std power ELR  []  provide change in position for LE  []  elevate legs during recline    []  maintain placement of feet on      footplate  []  decrease edema  []  improve circulation  []  actuator needed to elevate legrest  []  actuator needed to articulate legrest preventing knees from flexing  []  Increase ground clearance over      curbs  []   STD (pair) independently                     elevate legrest   POWER WHEELCHAIR CONTROLS      Controls/input device  []  Expandable  []  Non-expandable  []  Proportional  []  Right Hand []  Left Hand  []  Non-proportional/switches/head-array  []  Electrical/proximity         []   Mechanical      Manufacturer:___________________   Type:________________________ []  provides access for controlling wheelchair  []  programming for  accurate control  []  progressive disease/changing condition  []  required for alternative drive      controls       []  lacks motor control to operate  proportional drive control  []  unable to understand proportional controls  []  limited movement/strength  []  extraneous movement / tremors / ataxic / spastic       []  Upgraded electronics controller/harness    []  Single power (tilt or recline)   []   Expandable    []  Non-expandable plus   []  Multi-power (tilt, recline, power legrest, power seat lift, vertical positioning system, stand)  []  allows input device to communicate with drive motors  []  harness provides necessary connections between the controller, input device, and seat functions     []  needed in order to operate power seat functions through joystick/ input device  []  required for alternative drive controls     []  Enhanced display  []  required to connect all alternative drive controls   []  required for upgraded joystick      (lite-throw, heavy duty, micro)  []  Allows user to see in which mode and drive the wheelchair is set; necessary for alternate controls       []  Upgraded tracking electronics  []  correct tracking when on uneven surfaces makes switch driving more efficient and less fatiguing  []  increase safety when driving  []  increase ability to traverse thresholds    []  Safety / reset / mode switches     Type:    []  Used to change modes and stop the wheelchair when driving     []  Mount for joystick / input device/switches  []  swing away for access or transfers   []  attaches joystick / input device / switches to wheelchair   []  provides for consistent access  []  midline for optimal placement    []  Attendant controlled joystick plus     mount  []  safety  []  long distance driving  []  operation of seat functions  []  compliance with transportation regulations    []  Battery  []  required to power (power assist / scooter/ power wc / other):   []  Power inverter (24V to 12V)  []   required for ventilator / respiratory equipment / other:     CHAIR OPTIONS MANUAL & POWER      Armrests   [x]  adjustable height [x]  removable  []  swing away []  fixed  [x]  flip back  []  reclining  [x]  full length pads []  desk []  tube arms []  gel pads  [x]  provide support with elbow at 90    [x]  remove/flip back/swing away for  transfers  [x]  provide support and positioning of upper body    [x]  allow to come closer to table top  [x]  remove for access to tables  []  provide support for w/c tray  [x]  change of height/angles for variable activities   []  Elbow support / Elbow stop  []  keep elbow positioned on arm pad  []  keep arms from falling off arm pad  during tilt and/or recline   Upper Extremity Support  []  Arm trough  []   R  []   L  Style:  []  swivel mount []  fixed mount   []  posterior hand support  []   tray  []  full tray  []  joystick cut out  []   R  []   L  Style:  []  decrease gravitational pull on      shoulders  []  provide support to increase UE  function  []  provide hand support in natural    position  []  position flaccid UE  []  decrease subluxation    []  decrease edema       []  manage spasticity   []  provide midline positioning  []  provide work surface  []  placement for AAC/ Computer/ EADL       Hangers/ Legrests   []  ______ degree  []  Elevating []  articulating  []  swing away []  fixed []  lift off  []  heavy  duty  []  adjustable knee angle  []  adjustable calf panel   []  longer extension tube              []  provide LE support  []  maintain placement of feet on      footplate   []  accommodate lower leg length  []  accommodate to hamstring       tightness  []  enable transfers  []  provide change in position for LE's  []  elevate legs during recline    []  decrease edema  []  durability      Foot support   [x]  footplate []  R []  L [x]  flip up           [x]  Depth adjustable   [x]  angle adjustable  []  foot board/one piece    [x]  provide foot support  [x]  accommodate to  ankle ROM  [x]  allow foot to go under wheelchair base  [x]  enable transfers     []  Shoe holders  []  position foot    []  decrease / manage spasticity  []  control position of LE  []  stability    []  safety     []  Ankle strap/heel      loops  []  support foot on foot support  []  decrease extraneous movement  []  provide input to heel   []  protect foot     []  Amputee adapter []  R  []  L     Style:                  Size:  []  Provide support for stump/residual extremity    []  Transportation tie-down  []  to provide crash tested tie-down brackets    []  Crutch/cane holder    []  O2 holder    []  IV hanger   []  Ventilator tray/mount    []  stabilize accessory on wheelchair       Component  Justification     [x]  Seat cushion  - General use    [x]  accommodate impaired sensation  []  decubitus ulcers present or history  [x]  unable to shift weight  [x]  increase pressure distribution  []  prevent pelvic extension  []  custom required "off-the-shelf"    seat cushion will not accommodate deformity  [x]  stabilize/promote pelvis alignment  [x]  stabilize/promote femur alignment  []  accommodate obliquity  []  accommodate multiple deformity  []  incontinent/accidents  [x]  low maintenance     []  seat mounts                 []  fixed []  removable  []  attach seat platform/cushion to wheelchair frame    []  Seat wedge    []  provide increased aggressiveness of seat shape to decrease sliding  down in the seat  []  accommodate ROM        []  Cover replacement   []  protect back or seat cushion  []  incontinent/accidents    []  Solid seat / insert    []  support cushion to prevent      hammocking  []  allows attachment of cushion to mobility base    []  Lateral pelvic/thigh/hip     support (Guides)     []  decrease abduction  []  accommodate pelvis  []  position upper legs  []  accommodate spasticity  []  removable for transfers     []  Lateral pelvic/thigh      supports mounts  []  fixed   []  swing-away   []  removable  []   mounts lateral pelvic/thigh supports     []  mounts lateral pelvic/thigh supports  swing-away or removable for transfers    []  Medial thigh support (Pommel)  [] decrease adduction  [] accommodate ROM  []  remove for transfers   []  alignment      []  Medial thigh   []  fixed      support mounts      []  swing-away   []  removable  []  mounts medial thigh supports   []  Mounts medial supports swing- away or removable for transfers       Component  Justification   []  Back  - Tension adjustable     [x]  provide posterior trunk support []  facilitate tone  [x]  provide lumbar/sacral support []  accommodate deformity  []  support trunk in midline   []  custom required "off-the-shelf" back support will not accommodate deformity   [x]  provide lateral trunk support []  accommodate or decrease tone            []  Back mounts  []  fixed  []  removable  []  attach back rest/cushion to wheelchair frame   []  Lateral trunk      supports  []  R []  L  []  decrease lateral trunk leaning  []  accommodate asymmetry    []  contour for increased contact  []  safety    []  control of tone    []  Lateral trunk      supports mounts  []  fixed  []  swing-away   []  removable  []  mounts lateral trunk supports     []  Mounts lateral trunk supports swing-away or removable for transfers   []  Anterior chest      strap, vest     []  decrease forward movement of shoulder  []  decrease forward movement of trunk  []  safety/stability  []  added abdominal support  []  trunk alignment  []  assistance with shoulder control   []  decrease shoulder elevation    []  Headrest      []  provide posterior head support  []  provide posterior neck support  []  provide lateral head support  []  provide anterior head support  []  support during tilt and recline  []  improve feeding     []  improve respiration  []  placement of switches  []  safety    []  accommodate ROM   []  accommodate tone  []  improve visual orientation   []  Headrest           []  fixed []   removable []  flip down      Mounting hardware   []  swing-away laterals/switches  []  mount headrest   []  mounts headrest flip down or  removable for transfers  []  mount headrest swing-away laterals   []  mount switches     []  Neck Support    []  decrease neck rotation  []  decrease forward neck flexion   Pelvic Positioner    [x]  std hip belt          []  padded hip belt  []  dual pull hip belt  []  four point hip belt  []  stabilize tone  [x]  decrease falling out of chair  []  prevent excessive extension  []  special pull angle to control      rotation  []  pad for protection over boney   prominence  []  promote comfort    []  Essential needs        bag/pouch   []  medicines []  special food rorthotics []  clothing changes  []  diapers  []  catheter/hygiene []  ostomy supplies   The above equipment has a life- long use expectancy.  Growth and changes in medical and/or functional conditions  would be the exceptions.   SUMMARY:    ASSESSMENT:  CLINICAL IMPRESSION: Patient is a 63 y.o. female who was seen today for physical therapy evaluation and treatment for mobility assessment for relapsing remitting MS. Patient is currently using a Rolator for in home mobility with very limited community ambulation due to symptoms of MS. Patient is currently living independently and her brother lives with her. She reports that she helps her bother (who has above knee amputation) as being her caretaker for some of his ADLs, cooking etc. Patient reports difficulty with prolonged standing and walking due to fatigue from MS and due to R ankle/knee pain.   Least expensive device that will meet patient's personal and medical necessarily will be a K5 manual wheelchair. With lighter K5 manual wheelchair, patient will be able to manually propel wheelchair using her UE and LE within her home. Lighter wheelchair will help her manage fatigue and improve propulsion. Patient will be able to pick up lighter wheelchair on her own and place  in the car for community access as well. Overall patient will benefit from K5 wheelchair with manual propulsion to improve her ability to perform ADLs in reasonable amount of time, reduce her fall risk and improve independence.   .    OBJECTIVE IMPAIRMENTS Abnormal gait, decreased activity tolerance, decreased balance, decreased endurance, decreased mobility, difficulty walking, decreased strength, decreased safety awareness, increased edema, increased fascial restrictions, increased muscle spasms, impaired tone, postural dysfunction, and pain.   ACTIVITY LIMITATIONS carrying, lifting, bending, standing, stairs, and transfers  PARTICIPATION LIMITATIONS: meal prep, cleaning, laundry, shopping, and community activity  PERSONAL FACTORS Age and Time since onset of injury/illness/exacerbation are also affecting patient's functional outcome.   REHAB POTENTIAL: Good  CLINICAL DECISION MAKING: Stable/uncomplicated  EVALUATION COMPLEXITY: Low                                   GOALS: One time visit. No goals established.    PLAN: PT FREQUENCY: one time visit    Raj LOISE Blanch, PT 09/07/2024, 11:06 AM    I concur with the above findings and recommendations of the therapist:  Physician name printed:         Physician's signature:      Date:

## 2024-09-14 ENCOUNTER — Encounter: Payer: Self-pay | Admitting: Family Medicine

## 2024-09-16 ENCOUNTER — Ambulatory Visit
Admission: RE | Admit: 2024-09-16 | Discharge: 2024-09-16 | Disposition: A | Discharge status: Home / Self Care | Source: Ambulatory Visit | Attending: Family Medicine | Admitting: Family Medicine

## 2024-09-16 DIAGNOSIS — R928 Other abnormal and inconclusive findings on diagnostic imaging of breast: Secondary | ICD-10-CM | POA: Diagnosis not present

## 2024-09-16 DIAGNOSIS — N6489 Other specified disorders of breast: Secondary | ICD-10-CM

## 2024-09-16 DIAGNOSIS — R92323 Mammographic fibroglandular density, bilateral breasts: Secondary | ICD-10-CM | POA: Diagnosis not present

## 2024-09-17 ENCOUNTER — Ambulatory Visit: Admitting: Family Medicine

## 2024-09-17 ENCOUNTER — Encounter: Payer: Self-pay | Admitting: Family Medicine

## 2024-09-17 VITALS — BP 132/76 | HR 97 | Temp 98.8°F | Ht 64.0 in | Wt 203.4 lb

## 2024-09-17 DIAGNOSIS — J029 Acute pharyngitis, unspecified: Secondary | ICD-10-CM | POA: Diagnosis not present

## 2024-09-17 LAB — POC SOFIA 2 FLU + SARS ANTIGEN FIA
Influenza A, POC: NEGATIVE
Influenza B, POC: NEGATIVE
SARS Coronavirus 2 Ag: NEGATIVE

## 2024-09-17 LAB — POCT RAPID STREP A (OFFICE): Rapid Strep A Screen: NEGATIVE

## 2024-09-17 NOTE — Patient Instructions (Signed)
 It was nice meeting you. Your strep test, COVID and Flu test are negative. You might be having throat pain from a simple viral infection which resolves on its own. Let us  give it time. In the meantime, use Tylenol  as needed for pain and continue warm saline gurgle. See your PCP soon if there is no improvement.

## 2024-09-17 NOTE — Progress Notes (Signed)
    SUBJECTIVE:   CHIEF COMPLAINT / HPI:   Discussed the use of AI scribe software for clinical note transcription with the patient, who gave verbal consent to proceed.  History of Present Illness   Mariah Jackson is a 63 year old female with multiple sclerosis who presents with a sore throat and sniffles.   She has been experiencing a sore throat and sniffles for the past three to four days. The sore throat is localized to the left side and is painful when swallowing, rated as a 7 out of 10 in severity. There is a slight hoarseness in her voice. No fever at home and she is not aware of anyone being sick around her. Initially, she had a cough with phlegm, but this has mostly resolved, with only occasional coughing to clear her throat.  She has been using over-the-counter nighttime cold medicine, hot tea with lemon, cough drops, and homemade hot soup to alleviate her symptoms. These remedies have provided some relief, particularly in helping her sleep.  She has a history of multiple sclerosis, which affects her balance, making it difficult for her to get on the examination table. She also mentions a recent diagnostic mammogram due to a finding from 2020, which remains unchanged. PCP following her diagnostic mammogram result and recommendations       PERTINENT  PMH / PSH: PMHx reviewed  OBJECTIVE:   BP 132/76   Pulse 97   Temp 98.8 F (37.1 C)   Ht 5' 4 (1.626 m)   Wt 203 lb 6.4 oz (92.3 kg)   SpO2 97%   BMI 34.91 kg/m   Physical Exam   VITALS: BP- 132/76 HEENT: Ears: Left ear normal, tympanic membrane normal. Right ear normal, tympanic membrane normal, no erythema. Throat: Oropharynx normal, no erythema. CHEST: Lungs clear to auscultation, no wheezing. CARDIOVASCULAR: Heart regular rate and rhythm, normal S1, S2, no murmurs.       ASSESSMENT/PLAN:   Assessment & Plan Sore throat Assessment and Plan    Left-sided viral pharyngitis Negative COVID, flu, and strep tests  indicate viral etiology. Symptoms include hoarseness and occasional cough. - Recommend acetaminophen  or ibuprofen  for pain. - Advise warm saline gargles for relief.    General Health Maintenance Flu vaccination due for current season. - Follow up with PCP when viral symptoms are cleared for vaccination           Otto Fairly, MD [facility]

## 2024-09-23 ENCOUNTER — Ambulatory Visit: Admitting: Family Medicine

## 2024-09-23 VITALS — BP 158/73 | HR 96 | Wt 204.0 lb

## 2024-09-23 DIAGNOSIS — Z780 Asymptomatic menopausal state: Secondary | ICD-10-CM | POA: Diagnosis not present

## 2024-09-23 DIAGNOSIS — E038 Other specified hypothyroidism: Secondary | ICD-10-CM

## 2024-09-23 DIAGNOSIS — Z789 Other specified health status: Secondary | ICD-10-CM | POA: Diagnosis not present

## 2024-09-23 DIAGNOSIS — I10 Essential (primary) hypertension: Secondary | ICD-10-CM

## 2024-09-23 DIAGNOSIS — Z7409 Other reduced mobility: Secondary | ICD-10-CM

## 2024-09-23 DIAGNOSIS — E210 Primary hyperparathyroidism: Secondary | ICD-10-CM | POA: Diagnosis not present

## 2024-09-23 DIAGNOSIS — N6091 Unspecified benign mammary dysplasia of right breast: Secondary | ICD-10-CM

## 2024-09-23 DIAGNOSIS — Z23 Encounter for immunization: Secondary | ICD-10-CM

## 2024-09-23 MED ORDER — SPIRONOLACTONE-HCTZ 25-25 MG PO TABS: 1.0000 | ORAL_TABLET | Freq: Every day | ORAL | 3 refills | Status: AC

## 2024-09-23 NOTE — Patient Instructions (Signed)
  Please get the bone density test because your calcium  blood level is slightly high.  Dr Elliana Bal wants to make sure the calcium  is not being stolen from yuour bones and making them weak.    Restart your Aldactazide for blood pressure.

## 2024-09-24 ENCOUNTER — Encounter: Payer: Self-pay | Admitting: Family Medicine

## 2024-09-24 NOTE — Assessment & Plan Note (Signed)
 Established problem Uncontrolled.  Patient is not at goal of SBP < 140. ReStart: Aldactazide 25-25 daily Continue: Amlodipine  10 mg daily and Valsartan  80 mg daily.  Recheck BP and Basic Metabolic Panel next office visit

## 2024-09-24 NOTE — Progress Notes (Signed)
 Mariah Jackson is alone Sources of clinical information for visit is/are patient. Nursing assessment for this office visit was reviewed with the patient for accuracy and revision.   Previous Report(s) Reviewed: office visit report Neurology 06/03/24:      09/23/2024   10:03 AM  Depression screen PHQ 2/9  Decreased Interest 0  Down, Depressed, Hopeless 0  PHQ - 2 Score 0  Altered sleeping 0  Tired, decreased energy 0  Change in appetite 0  Feeling bad or failure about yourself  0  Trouble concentrating 0  Moving slowly or fidgety/restless 0  Suicidal thoughts 0  PHQ-9 Score 0  Difficult doing work/chores Not difficult at all   [employer] Office Visit from 09/23/2024 in [facility] [place]. [facility] Office Visit from 03/12/2024 in [facility] [place]. [facility] Office Visit from 02/05/2024 in [facility] - [facility] Of [place]. [facility]  Thoughts that you would be better off dead, or of hurting yourself in some way Not at all -- --  PHQ-9 Total Score 0 0 0       03/12/2024    9:36 AM 02/05/2024   10:07 AM 02/07/2023    9:08 AM 02/10/2020    9:19 AM 03/04/2019   10:09 AM  Fall Risk   Falls in the past year? 1 0 0 0  0   Number falls in past yr: 1 0 0    Injury with Fall? 0 0 0    Risk for fall due to : Impaired balance/gait;Impaired mobility  No Fall Risks    Risk for fall due to: Comment uses walker      Follow up Falls evaluation completed;Education provided  Falls evaluation completed       Data saved with a previous flowsheet row definition       09/23/2024   10:03 AM 03/12/2024    9:26 AM 02/05/2024   10:07 AM  PHQ9 SCORE ONLY  PHQ-9 Total Score 0 0  0      Data saved with a previous flowsheet row definition    There are no preventive care reminders to display for this patient.  Health Maintenance Due  Topic Date Due   Zoster Vaccines- Shingrix (1 of 2) Never done    Pneumococcal Vaccine: 50+ Years (1 of 1 - PCV) Never done   Cervical Cancer Screening (HPV/Pap Cotest)  10/17/2023   DEXA SCAN  01/22/2024   COVID-19 Vaccine (4 - 2025-26 season) 08/30/2024      History/P.E. limitations: none  There are no preventive care reminders to display for this patient. There are no preventive care reminders to display for this patient.  Health Maintenance Due  Topic Date Due   Zoster Vaccines- Shingrix (1 of 2) Never done   Pneumococcal Vaccine: 50+ Years (1 of 1 - PCV) Never done   Cervical Cancer Screening (HPV/Pap Cotest)  10/17/2023   DEXA SCAN  01/22/2024   COVID-19 Vaccine (4 - 2025-26 season) 08/30/2024     Chief Complaint  Patient presents with   Medication Management     Discussed the use of AI scribe software for clinical note transcription with the patient, who gave verbal consent to proceed.  History of Present Illness      SDOH Screenings   Food Insecurity: No Food Insecurity (03/12/2024)  Housing: Unknown (03/12/2024)  Transportation Needs: No Transportation  Needs (03/12/2024)  Utilities: Not At Risk (03/12/2024)  Alcohol Screen: Low Risk  (03/12/2024)  Depression (PHQ2-9): Low Risk  (09/23/2024)  Financial Resource Strain: Low Risk  (03/12/2024)  Physical Activity: Insufficiently Active (03/12/2024)  Social Connections: Moderately Integrated (03/12/2024)  Stress: No Stress Concern Present (03/12/2024)  Tobacco Use: Low Risk  (09/17/2024)  Health Literacy: Adequate Health Literacy (03/12/2024)   --------------------------------------------------------------------------------------------------------------------------------------------- Visit Problem List with Assessment and Plan   Assessment & Plan   Primary hyperparathyroidism Established problem Mariah Jackson remains asymptomatic Will recheck iPTH/Ca/P/creatinine Trial of restarting Aldactazide then recheck IPTH/Ca/p/creatinine at next office visit in three months.   Atypical ductal  hyperplasia of right breast Established problem MM 09/16/24 showed Stable appearance of the right breast biopsy site demonstrating ADH and complex sclerosing lesion. Mariah Jackson again declined referral for surgical excision of right breast lesion given history of left breast ductal carcinoma in situ.  She feels since the lesion has stayed stable, she does not want to pursue invasive procedure.  She appears to understand there is a risk to surveillance alone intervention.   Essential hypertension Established problem Uncontrolled.  Patient is not at goal of SBP < 140. ReStart: Aldactazide 25-25 daily Continue: Amlodipine  10 mg daily and Valsartan  80 mg daily.  Recheck BP and Basic Metabolic Panel next office visit    Hypothyroidism Need to check TSH next office visit   Impaired mobility and ADLs Rollator hand breaks are no longer operating well. Will ask DME supplier - [employer]- to repair the rollator's break system.

## 2024-09-24 NOTE — Assessment & Plan Note (Signed)
 Established problem MM 09/16/24 showed Stable appearance of the right breast biopsy site demonstrating ADH and complex sclerosing lesion. Mariah Jackson again declined referral for surgical excision of right breast lesion given history of left breast ductal carcinoma in situ.  She feels since the lesion has stayed stable, she does not want to pursue invasive procedure.  She appears to understand there is a risk to surveillance alone intervention.

## 2024-09-24 NOTE — Assessment & Plan Note (Signed)
 Need to check TSH next office visit

## 2024-09-24 NOTE — Assessment & Plan Note (Signed)
 Rollator hand breaks are no longer operating well. Will ask DME supplier - [employer]- to repair the rollator's break system.

## 2024-09-24 NOTE — Assessment & Plan Note (Addendum)
 Established problem Ms Calandro remains asymptomatic Will recheck iPTH/Ca/P/creatinine Trial of restarting Aldactazide then recheck IPTH/Ca/p/creatinine at next office visit in three months.  Addendum 09/27/24  Latest Reference Range & Units 09/23/24 10:46  Creatinine 0.57 - 1.00 mg/dL 9.10  Calcium  8.7 - 10.3 mg/dL 89.8  eGFR >40 fO/fpw/8.26 72  Phosphorus 3.0 - 4.3 mg/dL 2.3 (L)   Current serum calcium  within normal limits with serum creatinine > 60.

## 2024-09-25 LAB — CA+CREAT+P+PTH INTACT
Calcium: 10.1 mg/dL (ref 8.7–10.3)
Creatinine, Ser: 0.89 mg/dL (ref 0.57–1.00)
PTH: 56 pg/mL (ref 15–65)
Phosphorus: 2.3 mg/dL — ABNORMAL LOW (ref 3.0–4.3)
eGFR: 72 mL/min/1.73 (ref >59)

## 2024-10-05 ENCOUNTER — Telehealth: Payer: Self-pay

## 2024-10-05 NOTE — Telephone Encounter (Signed)
Community message sent to [employer]. 

## 2024-10-05 NOTE — Telephone Encounter (Signed)
-----   Message from [facility] McDiarmid sent at 09/24/2024  8:28 AM EDT ----- Could you contact Ms Lehr's Rollator walker supplier, [employer], to repair its hand breaks?  The device has become unsafe due to the hand break system failure.   Thank you,  Krystal

## 2024-10-07 NOTE — Progress Notes (Signed)
 Mariah Jackson                                          MRN: [id number]   10/07/2024   The [facility] Quality Team Specialist reviewed this patient medical record for the purposes of chart review for care gap closure. The following were reviewed: chart review for care gap closure-controlling blood pressure.    [facility] Quality Team

## 2024-10-22 DIAGNOSIS — R2689 Other abnormalities of gait and mobility: Secondary | ICD-10-CM | POA: Diagnosis not present

## 2024-10-22 DIAGNOSIS — R262 Difficulty in walking, not elsewhere classified: Secondary | ICD-10-CM | POA: Diagnosis not present

## 2024-10-22 DIAGNOSIS — G35D Multiple sclerosis, unspecified: Secondary | ICD-10-CM | POA: Diagnosis not present

## 2024-10-22 DIAGNOSIS — R278 Other lack of coordination: Secondary | ICD-10-CM | POA: Diagnosis not present

## 2024-11-02 DIAGNOSIS — H524 Presbyopia: Secondary | ICD-10-CM | POA: Diagnosis not present

## 2024-11-02 DIAGNOSIS — H5213 Myopia, bilateral: Secondary | ICD-10-CM | POA: Diagnosis not present

## 2024-11-02 DIAGNOSIS — H52223 Regular astigmatism, bilateral: Secondary | ICD-10-CM | POA: Diagnosis not present

## 2024-11-02 DIAGNOSIS — H2513 Age-related nuclear cataract, bilateral: Secondary | ICD-10-CM | POA: Diagnosis not present

## 2024-11-03 DIAGNOSIS — H5213 Myopia, bilateral: Secondary | ICD-10-CM | POA: Diagnosis not present

## 2024-11-03 DIAGNOSIS — H52223 Regular astigmatism, bilateral: Secondary | ICD-10-CM | POA: Diagnosis not present

## 2024-11-30 DIAGNOSIS — Z791 Long term (current) use of non-steroidal anti-inflammatories (NSAID): Secondary | ICD-10-CM | POA: Diagnosis not present

## 2024-11-30 DIAGNOSIS — E785 Hyperlipidemia, unspecified: Secondary | ICD-10-CM | POA: Diagnosis not present

## 2024-11-30 DIAGNOSIS — G35D Multiple sclerosis, unspecified: Secondary | ICD-10-CM | POA: Diagnosis not present

## 2024-11-30 DIAGNOSIS — M1990 Unspecified osteoarthritis, unspecified site: Secondary | ICD-10-CM | POA: Diagnosis not present

## 2024-11-30 DIAGNOSIS — M62838 Other muscle spasm: Secondary | ICD-10-CM | POA: Diagnosis not present

## 2024-11-30 DIAGNOSIS — K219 Gastro-esophageal reflux disease without esophagitis: Secondary | ICD-10-CM | POA: Diagnosis not present

## 2024-11-30 DIAGNOSIS — N182 Chronic kidney disease, stage 2 (mild): Secondary | ICD-10-CM | POA: Diagnosis not present

## 2024-11-30 DIAGNOSIS — E669 Obesity, unspecified: Secondary | ICD-10-CM | POA: Diagnosis not present

## 2024-11-30 DIAGNOSIS — I129 Hypertensive chronic kidney disease with stage 1 through stage 4 chronic kidney disease, or unspecified chronic kidney disease: Secondary | ICD-10-CM | POA: Diagnosis not present

## 2024-11-30 DIAGNOSIS — E039 Hypothyroidism, unspecified: Secondary | ICD-10-CM | POA: Diagnosis not present

## 2024-11-30 DIAGNOSIS — E213 Hyperparathyroidism, unspecified: Secondary | ICD-10-CM | POA: Diagnosis not present

## 2024-12-10 ENCOUNTER — Other Ambulatory Visit: Payer: Self-pay | Admitting: Family Medicine

## 2024-12-10 DIAGNOSIS — E7800 Pure hypercholesterolemia, unspecified: Secondary | ICD-10-CM

## 2024-12-10 DIAGNOSIS — K219 Gastro-esophageal reflux disease without esophagitis: Secondary | ICD-10-CM

## 2024-12-10 DIAGNOSIS — I10 Essential (primary) hypertension: Secondary | ICD-10-CM

## 2025-03-17 ENCOUNTER — Encounter

## 2025-06-06 ENCOUNTER — Ambulatory Visit: Admitting: Neurology
